# Patient Record
Sex: Male | Born: 1935 | Race: White | Hispanic: No | Smoking: Never smoker
Health system: Southern US, Community
[De-identification: ages and names within clinical notes are randomized; demographics above are authoritative.]

## PROBLEM LIST (undated history)

## (undated) DIAGNOSIS — B999 Unspecified infectious disease: Secondary | ICD-10-CM

## (undated) DIAGNOSIS — I10 Essential (primary) hypertension: Secondary | ICD-10-CM

## (undated) DIAGNOSIS — IMO0001 Reserved for inherently not codable concepts without codable children: Secondary | ICD-10-CM

## (undated) DIAGNOSIS — H409 Unspecified glaucoma: Secondary | ICD-10-CM

## (undated) DIAGNOSIS — D649 Anemia, unspecified: Secondary | ICD-10-CM

## (undated) DIAGNOSIS — K219 Gastro-esophageal reflux disease without esophagitis: Secondary | ICD-10-CM

## (undated) DIAGNOSIS — H9190 Unspecified hearing loss, unspecified ear: Secondary | ICD-10-CM

## (undated) DIAGNOSIS — M1990 Unspecified osteoarthritis, unspecified site: Secondary | ICD-10-CM

## (undated) DIAGNOSIS — H269 Unspecified cataract: Secondary | ICD-10-CM

## (undated) DIAGNOSIS — F419 Anxiety disorder, unspecified: Secondary | ICD-10-CM

## (undated) HISTORY — PX: TONSILLECTOMY: SUR1361

## (undated) HISTORY — PX: HERNIA REPAIR: SHX51

## (undated) HISTORY — PX: OTHER SURGICAL HISTORY: SHX169

## (undated) HISTORY — PX: EYE SURGERY: SHX253

---

## 2004-05-30 ENCOUNTER — Ambulatory Visit (HOSPITAL_COMMUNITY): Admission: RE | Admit: 2004-05-30 | Discharge: 2004-05-30 | Payer: Self-pay | Admitting: Gastroenterology

## 2010-10-14 ENCOUNTER — Encounter (HOSPITAL_COMMUNITY)
Admission: RE | Admit: 2010-10-14 | Discharge: 2010-10-14 | Disposition: A | Discharge status: Home / Self Care | Payer: Medicare Other | Source: Ambulatory Visit | Attending: Orthopedic Surgery | Admitting: Orthopedic Surgery

## 2010-10-14 ENCOUNTER — Ambulatory Visit (HOSPITAL_COMMUNITY)
Admission: RE | Admit: 2010-10-14 | Discharge: 2010-10-14 | Disposition: A | Discharge status: Home / Self Care | Payer: Medicare Other | Source: Ambulatory Visit | Attending: Orthopedic Surgery | Admitting: Orthopedic Surgery

## 2010-10-14 ENCOUNTER — Other Ambulatory Visit: Payer: Self-pay | Admitting: Orthopedic Surgery

## 2010-10-14 DIAGNOSIS — Z01811 Encounter for preprocedural respiratory examination: Secondary | ICD-10-CM | POA: Insufficient documentation

## 2010-10-14 DIAGNOSIS — Z01812 Encounter for preprocedural laboratory examination: Secondary | ICD-10-CM | POA: Insufficient documentation

## 2010-10-14 DIAGNOSIS — X58XXXA Exposure to other specified factors, initial encounter: Secondary | ICD-10-CM | POA: Insufficient documentation

## 2010-10-14 DIAGNOSIS — IMO0002 Reserved for concepts with insufficient information to code with codable children: Secondary | ICD-10-CM | POA: Insufficient documentation

## 2010-10-14 DIAGNOSIS — Z01818 Encounter for other preprocedural examination: Secondary | ICD-10-CM | POA: Insufficient documentation

## 2010-10-14 DIAGNOSIS — S83206A Unspecified tear of unspecified meniscus, current injury, right knee, initial encounter: Secondary | ICD-10-CM

## 2010-10-14 LAB — CBC
HCT: 42.5 % (ref 39.0–52.0)
Hemoglobin: 15.5 g/dL (ref 13.0–17.0)
MCH: 33.3 pg (ref 26.0–34.0)
MCHC: 36.5 g/dL — ABNORMAL HIGH (ref 30.0–36.0)
MCV: 91.2 fL (ref 78.0–100.0)
Platelets: 232 10*3/uL (ref 150–400)
RBC: 4.66 MIL/uL (ref 4.22–5.81)
RDW: 12.9 % (ref 11.5–15.5)
WBC: 8.6 10*3/uL (ref 4.0–10.5)

## 2010-10-14 LAB — SURGICAL PCR SCREEN: MRSA, PCR: NEGATIVE

## 2010-10-14 LAB — BASIC METABOLIC PANEL
CO2: 27 mEq/L (ref 19–32)
Calcium: 9.6 mg/dL (ref 8.4–10.5)
Chloride: 105 mEq/L (ref 96–112)
Glucose, Bld: 87 mg/dL (ref 70–99)
Sodium: 138 mEq/L (ref 135–145)

## 2010-10-15 ENCOUNTER — Inpatient Hospital Stay (HOSPITAL_COMMUNITY)
Admission: RE | Admit: 2010-10-15 | Discharge: 2010-10-15 | DRG: 489 | Disposition: A | Discharge status: Home Health | Payer: Medicare Other | Source: Ambulatory Visit | Attending: Orthopedic Surgery | Admitting: Orthopedic Surgery

## 2010-10-15 DIAGNOSIS — X58XXXA Exposure to other specified factors, initial encounter: Secondary | ICD-10-CM | POA: Diagnosis present

## 2010-10-15 DIAGNOSIS — IMO0002 Reserved for concepts with insufficient information to code with codable children: Principal | ICD-10-CM | POA: Diagnosis present

## 2010-10-15 DIAGNOSIS — I10 Essential (primary) hypertension: Secondary | ICD-10-CM | POA: Diagnosis present

## 2010-10-17 NOTE — Op Note (Signed)
  NAME:  Roger, Cameron               ACCOUNT NO.:  [id number]  MEDICAL RECORD NO.:  [id number]           PATIENT TYPE:  I  LOCATION:  2550                         FACILITY:  [facility]  PHYSICIAN:  Burnard Bunting, M.D.    DATE OF BIRTH:  02-Aug-1928  DATE OF PROCEDURE:  10/14/2009 DATE OF DISCHARGE:  10/15/2010                              OPERATIVE REPORT   PREOPERATIVE DIAGNOSIS:  Right knee medial meniscal tear.  POSTOPERATIVE DIAGNOSIS:  Right knee medial meniscal tear.  PROCEDURES:  Right knee diagnostic arthroscopy, partial meniscectomy.  SURGEON:  Burnard Bunting, MD  ASSISTANT:  None.  ANESTHESIA:  General endotracheal.  ESTIMATED BLOOD LOSS:  Minimal.  INDICATIONS:  [name] is a 77 year old patient who sustained a right knee injury, presents now for right knee arthroscopy, partial medial meniscectomy after failure to conservative management.  I explained to him the risk and benefits.  OPERATIVE FINDINGS: 1. Examination under anesthesia, range of motion 0-130 with severely     varus-valgus stress.  ACL and PCL intact.  No posterolateral     rotatory instability is noted. 2. Diagnostic operative arthroscopy. 3. Intact lateral compartment and articular cartilage of meniscus. 4. 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  No     loose bodies in the medial or lateral gutter.  ACL and PCL intact.     Medial meniscus had a tear involving 50-60% anteroposterior width     of the meniscus, posterior horn oblique and radial components.     There was some early grade 1 chondromalacia on the medial femoral     condyle and medial tibial plateau.  PROCEDURE IN DETAIL:  The patient was brought to the operating room where general endotracheal anesthesia was induced.  Preoperative IV antibiotics were administered.  Time-out was  called.  Right foot was prepped with DuraPrep solution and draped in sterile manner.  Anterior, inferior, lateral ports were established.  Anterior-inferior medial portal was established from direct visualization.  Diagnostic arthroscopy was performed.  The patient did have some grade 1 chondromalacia on the surface of the patella, small old chondral defect noted in the distal zone of the trochlea.  This is about the size of a dime.  Lateral compartment, articular cartilage and meniscus was intact. ACL, PCL intact.  Medial meniscal tear was identified, debrided back to stable rim using a combination of basket punch and shaver.  Thorough irrigation was then performed to the rest of the knee.  The instruments were removed.  Port was closed with 3-0 nylon.  Solution of Marcaine, morphine and clonidine was injected to the knee.  The patient tolerated the procedure without immediate complication.  Bulky wrap was applied.     Burnard Bunting, M.D.     GSD/MEDQ  D:  10/15/2010  T:  10/16/2010  Job:  [id number]  Electronically Signed by Reece Agar.  Abeer Deskins M.D. on 10/17/2010 08:58:59 AM

## 2011-07-16 ENCOUNTER — Other Ambulatory Visit: Payer: Self-pay | Admitting: Rehabilitation

## 2011-07-16 DIAGNOSIS — M419 Scoliosis, unspecified: Secondary | ICD-10-CM

## 2011-07-23 ENCOUNTER — Ambulatory Visit
Admission: RE | Admit: 2011-07-23 | Discharge: 2011-07-23 | Disposition: A | Discharge status: Home / Self Care | Payer: Medicare Other | Source: Ambulatory Visit | Attending: Rehabilitation | Admitting: Rehabilitation

## 2011-07-23 ENCOUNTER — Ambulatory Visit
Admission: RE | Admit: 2011-07-23 | Discharge: 2011-07-23 | Disposition: A | Discharge status: Home / Self Care | Payer: 59 | Source: Ambulatory Visit | Attending: Rehabilitation | Admitting: Rehabilitation

## 2011-07-23 DIAGNOSIS — M419 Scoliosis, unspecified: Secondary | ICD-10-CM

## 2011-07-23 DIAGNOSIS — Z1389 Encounter for screening for other disorder: Secondary | ICD-10-CM | POA: Diagnosis not present

## 2011-07-23 DIAGNOSIS — R209 Unspecified disturbances of skin sensation: Secondary | ICD-10-CM | POA: Diagnosis not present

## 2011-07-23 DIAGNOSIS — M5126 Other intervertebral disc displacement, lumbar region: Secondary | ICD-10-CM | POA: Diagnosis not present

## 2011-07-23 DIAGNOSIS — M47817 Spondylosis without myelopathy or radiculopathy, lumbosacral region: Secondary | ICD-10-CM | POA: Diagnosis not present

## 2011-07-28 DIAGNOSIS — R7301 Impaired fasting glucose: Secondary | ICD-10-CM | POA: Diagnosis not present

## 2011-07-28 DIAGNOSIS — E785 Hyperlipidemia, unspecified: Secondary | ICD-10-CM | POA: Diagnosis not present

## 2011-07-28 DIAGNOSIS — I10 Essential (primary) hypertension: Secondary | ICD-10-CM | POA: Diagnosis not present

## 2011-07-28 DIAGNOSIS — Z125 Encounter for screening for malignant neoplasm of prostate: Secondary | ICD-10-CM | POA: Diagnosis not present

## 2011-07-28 DIAGNOSIS — R82998 Other abnormal findings in urine: Secondary | ICD-10-CM | POA: Diagnosis not present

## 2011-07-28 DIAGNOSIS — M109 Gout, unspecified: Secondary | ICD-10-CM | POA: Diagnosis not present

## 2011-07-31 DIAGNOSIS — M545 Low back pain: Secondary | ICD-10-CM | POA: Diagnosis not present

## 2011-07-31 DIAGNOSIS — M48061 Spinal stenosis, lumbar region without neurogenic claudication: Secondary | ICD-10-CM | POA: Diagnosis not present

## 2011-08-04 DIAGNOSIS — Z1212 Encounter for screening for malignant neoplasm of rectum: Secondary | ICD-10-CM | POA: Diagnosis not present

## 2011-08-04 DIAGNOSIS — Z125 Encounter for screening for malignant neoplasm of prostate: Secondary | ICD-10-CM | POA: Diagnosis not present

## 2011-08-04 DIAGNOSIS — Z0000 Encounter for general adult medical examination without abnormal findings: Secondary | ICD-10-CM | POA: Diagnosis not present

## 2011-08-04 DIAGNOSIS — E785 Hyperlipidemia, unspecified: Secondary | ICD-10-CM | POA: Diagnosis not present

## 2011-08-04 DIAGNOSIS — F341 Dysthymic disorder: Secondary | ICD-10-CM | POA: Diagnosis not present

## 2011-08-04 DIAGNOSIS — I10 Essential (primary) hypertension: Secondary | ICD-10-CM | POA: Diagnosis not present

## 2011-08-05 DIAGNOSIS — M4120 Other idiopathic scoliosis, site unspecified: Secondary | ICD-10-CM | POA: Diagnosis not present

## 2011-08-05 DIAGNOSIS — M545 Low back pain: Secondary | ICD-10-CM | POA: Diagnosis not present

## 2011-08-05 DIAGNOSIS — M48061 Spinal stenosis, lumbar region without neurogenic claudication: Secondary | ICD-10-CM | POA: Diagnosis not present

## 2011-08-21 DIAGNOSIS — M545 Low back pain: Secondary | ICD-10-CM | POA: Diagnosis not present

## 2011-08-21 DIAGNOSIS — IMO0002 Reserved for concepts with insufficient information to code with codable children: Secondary | ICD-10-CM | POA: Diagnosis not present

## 2011-08-21 DIAGNOSIS — M48061 Spinal stenosis, lumbar region without neurogenic claudication: Secondary | ICD-10-CM | POA: Diagnosis not present

## 2011-08-26 DIAGNOSIS — M545 Low back pain: Secondary | ICD-10-CM | POA: Diagnosis not present

## 2011-08-26 DIAGNOSIS — M48061 Spinal stenosis, lumbar region without neurogenic claudication: Secondary | ICD-10-CM | POA: Diagnosis not present

## 2011-09-08 DIAGNOSIS — M545 Low back pain: Secondary | ICD-10-CM | POA: Diagnosis not present

## 2011-09-08 DIAGNOSIS — M47817 Spondylosis without myelopathy or radiculopathy, lumbosacral region: Secondary | ICD-10-CM | POA: Diagnosis not present

## 2011-09-08 DIAGNOSIS — IMO0002 Reserved for concepts with insufficient information to code with codable children: Secondary | ICD-10-CM | POA: Diagnosis not present

## 2011-09-09 DIAGNOSIS — IMO0002 Reserved for concepts with insufficient information to code with codable children: Secondary | ICD-10-CM | POA: Diagnosis not present

## 2011-09-09 DIAGNOSIS — M545 Low back pain: Secondary | ICD-10-CM | POA: Diagnosis not present

## 2011-09-25 DIAGNOSIS — M545 Low back pain: Secondary | ICD-10-CM | POA: Diagnosis not present

## 2011-09-25 DIAGNOSIS — M47817 Spondylosis without myelopathy or radiculopathy, lumbosacral region: Secondary | ICD-10-CM | POA: Diagnosis not present

## 2011-09-30 DIAGNOSIS — M47817 Spondylosis without myelopathy or radiculopathy, lumbosacral region: Secondary | ICD-10-CM | POA: Diagnosis not present

## 2011-10-20 DIAGNOSIS — M47817 Spondylosis without myelopathy or radiculopathy, lumbosacral region: Secondary | ICD-10-CM | POA: Diagnosis not present

## 2011-10-20 DIAGNOSIS — M545 Low back pain: Secondary | ICD-10-CM | POA: Diagnosis not present

## 2011-12-22 DIAGNOSIS — Z961 Presence of intraocular lens: Secondary | ICD-10-CM | POA: Diagnosis not present

## 2011-12-22 DIAGNOSIS — H409 Unspecified glaucoma: Secondary | ICD-10-CM | POA: Diagnosis not present

## 2011-12-22 DIAGNOSIS — H4011X0 Primary open-angle glaucoma, stage unspecified: Secondary | ICD-10-CM | POA: Diagnosis not present

## 2011-12-22 DIAGNOSIS — H25019 Cortical age-related cataract, unspecified eye: Secondary | ICD-10-CM | POA: Diagnosis not present

## 2011-12-29 DIAGNOSIS — M25569 Pain in unspecified knee: Secondary | ICD-10-CM | POA: Diagnosis not present

## 2012-01-13 DIAGNOSIS — L570 Actinic keratosis: Secondary | ICD-10-CM | POA: Diagnosis not present

## 2012-01-13 DIAGNOSIS — I872 Venous insufficiency (chronic) (peripheral): Secondary | ICD-10-CM | POA: Diagnosis not present

## 2012-01-13 DIAGNOSIS — L820 Inflamed seborrheic keratosis: Secondary | ICD-10-CM | POA: Diagnosis not present

## 2012-01-15 DIAGNOSIS — IMO0002 Reserved for concepts with insufficient information to code with codable children: Secondary | ICD-10-CM | POA: Diagnosis not present

## 2012-01-30 DIAGNOSIS — Y939 Activity, unspecified: Secondary | ICD-10-CM | POA: Diagnosis not present

## 2012-01-30 DIAGNOSIS — X58XXXA Exposure to other specified factors, initial encounter: Secondary | ICD-10-CM | POA: Diagnosis not present

## 2012-01-30 DIAGNOSIS — M23305 Other meniscus derangements, unspecified medial meniscus, unspecified knee: Secondary | ICD-10-CM | POA: Diagnosis not present

## 2012-01-30 DIAGNOSIS — IMO0002 Reserved for concepts with insufficient information to code with codable children: Secondary | ICD-10-CM | POA: Diagnosis not present

## 2012-01-30 DIAGNOSIS — Y929 Unspecified place or not applicable: Secondary | ICD-10-CM | POA: Diagnosis not present

## 2012-01-30 DIAGNOSIS — M2240 Chondromalacia patellae, unspecified knee: Secondary | ICD-10-CM | POA: Diagnosis not present

## 2012-01-30 DIAGNOSIS — Y998 Other external cause status: Secondary | ICD-10-CM | POA: Diagnosis not present

## 2012-01-30 DIAGNOSIS — M959 Acquired deformity of musculoskeletal system, unspecified: Secondary | ICD-10-CM | POA: Diagnosis not present

## 2012-01-30 DIAGNOSIS — M948X9 Other specified disorders of cartilage, unspecified sites: Secondary | ICD-10-CM | POA: Diagnosis not present

## 2012-02-05 DIAGNOSIS — M23305 Other meniscus derangements, unspecified medial meniscus, unspecified knee: Secondary | ICD-10-CM | POA: Diagnosis not present

## 2012-03-11 DIAGNOSIS — D0460 Carcinoma in situ of skin of unspecified upper limb, including shoulder: Secondary | ICD-10-CM | POA: Diagnosis not present

## 2012-04-12 DIAGNOSIS — E785 Hyperlipidemia, unspecified: Secondary | ICD-10-CM | POA: Diagnosis not present

## 2012-04-12 DIAGNOSIS — R7301 Impaired fasting glucose: Secondary | ICD-10-CM | POA: Diagnosis not present

## 2012-04-12 DIAGNOSIS — I10 Essential (primary) hypertension: Secondary | ICD-10-CM | POA: Diagnosis not present

## 2012-04-12 DIAGNOSIS — M545 Low back pain: Secondary | ICD-10-CM | POA: Diagnosis not present

## 2012-04-12 DIAGNOSIS — Z23 Encounter for immunization: Secondary | ICD-10-CM | POA: Diagnosis not present

## 2012-04-22 DIAGNOSIS — M23305 Other meniscus derangements, unspecified medial meniscus, unspecified knee: Secondary | ICD-10-CM | POA: Diagnosis not present

## 2012-04-28 DIAGNOSIS — D0460 Carcinoma in situ of skin of unspecified upper limb, including shoulder: Secondary | ICD-10-CM | POA: Diagnosis not present

## 2012-05-18 DIAGNOSIS — M1710 Unilateral primary osteoarthritis, unspecified knee: Secondary | ICD-10-CM | POA: Diagnosis not present

## 2012-06-16 DIAGNOSIS — M1710 Unilateral primary osteoarthritis, unspecified knee: Secondary | ICD-10-CM | POA: Diagnosis not present

## 2012-06-23 DIAGNOSIS — M1710 Unilateral primary osteoarthritis, unspecified knee: Secondary | ICD-10-CM | POA: Diagnosis not present

## 2012-06-28 DIAGNOSIS — H4011X0 Primary open-angle glaucoma, stage unspecified: Secondary | ICD-10-CM | POA: Diagnosis not present

## 2012-06-28 DIAGNOSIS — H409 Unspecified glaucoma: Secondary | ICD-10-CM | POA: Diagnosis not present

## 2012-06-30 DIAGNOSIS — M1710 Unilateral primary osteoarthritis, unspecified knee: Secondary | ICD-10-CM | POA: Diagnosis not present

## 2012-07-28 DIAGNOSIS — M109 Gout, unspecified: Secondary | ICD-10-CM | POA: Diagnosis not present

## 2012-07-28 DIAGNOSIS — E785 Hyperlipidemia, unspecified: Secondary | ICD-10-CM | POA: Diagnosis not present

## 2012-07-28 DIAGNOSIS — R82998 Other abnormal findings in urine: Secondary | ICD-10-CM | POA: Diagnosis not present

## 2012-07-28 DIAGNOSIS — Z125 Encounter for screening for malignant neoplasm of prostate: Secondary | ICD-10-CM | POA: Diagnosis not present

## 2012-07-28 DIAGNOSIS — R7301 Impaired fasting glucose: Secondary | ICD-10-CM | POA: Diagnosis not present

## 2012-07-28 DIAGNOSIS — I10 Essential (primary) hypertension: Secondary | ICD-10-CM | POA: Diagnosis not present

## 2012-08-04 DIAGNOSIS — M545 Low back pain: Secondary | ICD-10-CM | POA: Diagnosis not present

## 2012-08-04 DIAGNOSIS — Z0000 Encounter for general adult medical examination without abnormal findings: Secondary | ICD-10-CM | POA: Diagnosis not present

## 2012-08-04 DIAGNOSIS — K219 Gastro-esophageal reflux disease without esophagitis: Secondary | ICD-10-CM | POA: Diagnosis not present

## 2012-08-04 DIAGNOSIS — Z1331 Encounter for screening for depression: Secondary | ICD-10-CM | POA: Diagnosis not present

## 2012-08-04 DIAGNOSIS — Z125 Encounter for screening for malignant neoplasm of prostate: Secondary | ICD-10-CM | POA: Diagnosis not present

## 2012-08-04 DIAGNOSIS — Z1212 Encounter for screening for malignant neoplasm of rectum: Secondary | ICD-10-CM | POA: Diagnosis not present

## 2012-08-04 DIAGNOSIS — I10 Essential (primary) hypertension: Secondary | ICD-10-CM | POA: Diagnosis not present

## 2012-08-12 DIAGNOSIS — M1710 Unilateral primary osteoarthritis, unspecified knee: Secondary | ICD-10-CM | POA: Diagnosis not present

## 2012-10-20 DIAGNOSIS — L820 Inflamed seborrheic keratosis: Secondary | ICD-10-CM | POA: Diagnosis not present

## 2012-10-20 DIAGNOSIS — L819 Disorder of pigmentation, unspecified: Secondary | ICD-10-CM | POA: Diagnosis not present

## 2012-10-20 DIAGNOSIS — L570 Actinic keratosis: Secondary | ICD-10-CM | POA: Diagnosis not present

## 2012-11-11 DIAGNOSIS — M1710 Unilateral primary osteoarthritis, unspecified knee: Secondary | ICD-10-CM | POA: Diagnosis not present

## 2012-11-15 ENCOUNTER — Other Ambulatory Visit: Payer: Self-pay | Admitting: Orthopedic Surgery

## 2012-11-15 DIAGNOSIS — M1711 Unilateral primary osteoarthritis, right knee: Secondary | ICD-10-CM

## 2012-11-22 ENCOUNTER — Ambulatory Visit
Admission: RE | Admit: 2012-11-22 | Discharge: 2012-11-22 | Disposition: A | Discharge status: Home / Self Care | Payer: Medicare Other | Source: Ambulatory Visit | Attending: Orthopedic Surgery | Admitting: Orthopedic Surgery

## 2012-11-22 DIAGNOSIS — M259 Joint disorder, unspecified: Secondary | ICD-10-CM | POA: Diagnosis not present

## 2012-11-22 DIAGNOSIS — M1711 Unilateral primary osteoarthritis, right knee: Secondary | ICD-10-CM

## 2012-11-22 DIAGNOSIS — M1710 Unilateral primary osteoarthritis, unspecified knee: Secondary | ICD-10-CM | POA: Diagnosis not present

## 2012-12-14 DIAGNOSIS — H409 Unspecified glaucoma: Secondary | ICD-10-CM | POA: Diagnosis not present

## 2012-12-14 DIAGNOSIS — H4011X0 Primary open-angle glaucoma, stage unspecified: Secondary | ICD-10-CM | POA: Diagnosis not present

## 2012-12-21 ENCOUNTER — Encounter (HOSPITAL_COMMUNITY): Payer: Self-pay | Admitting: Pharmacy Technician

## 2012-12-21 ENCOUNTER — Other Ambulatory Visit: Payer: Self-pay | Admitting: Orthopedic Surgery

## 2012-12-21 MED ORDER — BUPIVACAINE LIPOSOME 1.3 % IJ SUSP: 20.0000 mL | Freq: Once | INTRAMUSCULAR | Status: DC

## 2012-12-21 MED ORDER — DEXAMETHASONE SODIUM PHOSPHATE 10 MG/ML IJ SOLN: 10.0000 mg | Freq: Once | INTRAMUSCULAR | Status: DC

## 2012-12-21 NOTE — Progress Notes (Signed)
Need orders in EPIC.  Preop appoint 12/29/12 at 1100.  Surgery 01/03/13.  Thank You.

## 2012-12-21 NOTE — Progress Notes (Signed)
Preoperative surgical orders have been place into the Epic hospital system for Lum Keas on 12/21/2012, 4:19 PM  by Patrica Duel for surgery on 01/03/2013.  Preop Uni Knee orders including Experal, PO Tylenol, and IV Decadron as long as there are no contraindications to the above medications. Avel Peace, PA-C

## 2012-12-29 ENCOUNTER — Encounter (HOSPITAL_COMMUNITY)
Admission: RE | Admit: 2012-12-29 | Discharge: 2012-12-29 | Disposition: A | Discharge status: Home / Self Care | Payer: Medicare Other | Source: Ambulatory Visit | Attending: Orthopedic Surgery | Admitting: Orthopedic Surgery

## 2012-12-29 ENCOUNTER — Encounter (HOSPITAL_COMMUNITY): Payer: Self-pay

## 2012-12-29 ENCOUNTER — Ambulatory Visit (HOSPITAL_COMMUNITY)
Admission: RE | Admit: 2012-12-29 | Discharge: 2012-12-29 | Disposition: A | Discharge status: Home / Self Care | Payer: Medicare Other | Source: Ambulatory Visit | Attending: Orthopedic Surgery | Admitting: Orthopedic Surgery

## 2012-12-29 DIAGNOSIS — Z01810 Encounter for preprocedural cardiovascular examination: Secondary | ICD-10-CM | POA: Diagnosis not present

## 2012-12-29 DIAGNOSIS — Z01818 Encounter for other preprocedural examination: Secondary | ICD-10-CM | POA: Diagnosis not present

## 2012-12-29 DIAGNOSIS — J8410 Pulmonary fibrosis, unspecified: Secondary | ICD-10-CM | POA: Insufficient documentation

## 2012-12-29 DIAGNOSIS — Z0183 Encounter for blood typing: Secondary | ICD-10-CM | POA: Insufficient documentation

## 2012-12-29 DIAGNOSIS — I10 Essential (primary) hypertension: Secondary | ICD-10-CM | POA: Insufficient documentation

## 2012-12-29 DIAGNOSIS — I771 Stricture of artery: Secondary | ICD-10-CM | POA: Insufficient documentation

## 2012-12-29 DIAGNOSIS — Z01812 Encounter for preprocedural laboratory examination: Secondary | ICD-10-CM | POA: Diagnosis not present

## 2012-12-29 DIAGNOSIS — R9431 Abnormal electrocardiogram [ECG] [EKG]: Secondary | ICD-10-CM | POA: Insufficient documentation

## 2012-12-29 HISTORY — DX: Essential (primary) hypertension: I10

## 2012-12-29 HISTORY — DX: Gastro-esophageal reflux disease without esophagitis: K21.9

## 2012-12-29 HISTORY — DX: Unspecified glaucoma: H40.9

## 2012-12-29 HISTORY — DX: Unspecified osteoarthritis, unspecified site: M19.90

## 2012-12-29 HISTORY — DX: Reserved for inherently not codable concepts without codable children: IMO0001

## 2012-12-29 HISTORY — DX: Unspecified hearing loss, unspecified ear: H91.90

## 2012-12-29 HISTORY — DX: Anxiety disorder, unspecified: F41.9

## 2012-12-29 HISTORY — DX: Unspecified cataract: H26.9

## 2012-12-29 LAB — COMPREHENSIVE METABOLIC PANEL
Albumin: 4.2 g/dL (ref 3.5–5.2)
BUN: 9 mg/dL (ref 6–23)
CO2: 27 mEq/L (ref 19–32)
Calcium: 10 mg/dL (ref 8.4–10.5)
Chloride: 99 mEq/L (ref 96–112)
Creatinine, Ser: 0.83 mg/dL (ref 0.50–1.35)
Potassium: 4.5 mEq/L (ref 3.5–5.1)
Sodium: 136 mEq/L (ref 135–145)
Total Protein: 7.8 g/dL (ref 6.0–8.3)

## 2012-12-29 LAB — CBC
Hemoglobin: 16 g/dL (ref 13.0–17.0)
MCH: 32.4 pg (ref 26.0–34.0)
RBC: 4.94 MIL/uL (ref 4.22–5.81)
WBC: 8.6 10*3/uL (ref 4.0–10.5)

## 2012-12-29 LAB — PROTIME-INR
INR: 1.13 (ref 0.00–1.49)
Prothrombin Time: 14.3 seconds (ref 11.6–15.2)

## 2012-12-29 LAB — URINALYSIS, ROUTINE W REFLEX MICROSCOPIC
Bilirubin Urine: NEGATIVE
Ketones, ur: NEGATIVE mg/dL
Nitrite: NEGATIVE
Protein, ur: NEGATIVE mg/dL
Urobilinogen, UA: 0.2 mg/dL (ref 0.0–1.0)
pH: 6.5 (ref 5.0–8.0)

## 2012-12-29 LAB — APTT: aPTT: 34 seconds (ref 24–37)

## 2012-12-29 LAB — URINE MICROSCOPIC-ADD ON

## 2012-12-29 LAB — SURGICAL PCR SCREEN: Staphylococcus aureus: NEGATIVE

## 2012-12-29 NOTE — Patient Instructions (Addendum)
YOUR SURGERY IS SCHEDULED AT [facility]  ON:  Monday  June 16  REPORT TO [facility] AT:  9:40 AM      PHONE # FOR SHORT STAY IS [phone]  DO NOT EAT OR DRINK ANYTHING AFTER MIDNIGHT THE NIGHT BEFORE YOUR SURGERY.  YOU MAY BRUSH YOUR TEETH, RINSE OUT YOUR MOUTH--BUT NO WATER, NO FOOD, NO CHEWING GUM, NO MINTS, NO CANDIES, NO CHEWING TOBACCO.  PLEASE TAKE THE FOLLOWING MEDICATIONS THE AM OF YOUR SURGERY WITH A FEW SIPS OF WATER:  ATENOLOL, PREVACID,  ALPRAZOLAM,  AND BRING YOUR EYE DROPS TO THE HOSPITAL.   DO NOT BRING VALUABLES, MONEY, CREDIT CARDS.  DO NOT WEAR JEWELRY, MAKE-UP, NAIL POLISH AND NO METAL PINS OR CLIPS IN YOUR HAIR. CONTACT LENS, DENTURES / PARTIALS, GLASSES SHOULD NOT BE WORN TO SURGERY AND IN MOST CASES-HEARING AIDS WILL NEED TO BE REMOVED.  BRING YOUR GLASSES CASE, ANY EQUIPMENT NEEDED FOR YOUR CONTACT LENS. FOR PATIENTS ADMITTED TO THE HOSPITAL--CHECK OUT TIME THE DAY OF DISCHARGE IS 11:00 AM.  ALL INPATIENT ROOMS ARE PRIVATE - WITH BATHROOM, TELEPHONE, TELEVISION AND WIFI INTERNET.                                 PLEASE READ OVER ANY  FACT SHEETS THAT YOU WERE GIVEN: MRSA INFORMATION, BLOOD TRANSFUSION INFORMATION, INCENTIVE SPIROMETER INFORMATION. FAILURE TO FOLLOW THESE INSTRUCTIONS MAY RESULT IN THE CANCELLATION OF YOUR SURGERY.   PATIENT SIGNATURE_________________________________  Pt notified his surgery time changed - surgery is at 11:35 am and he should arrive to short stay by 8:35 am on Monday 6/16.  Pt voiced understanding.

## 2012-12-29 NOTE — Pre-Procedure Instructions (Signed)
EKG AND CXR WERE DONE TODAY PREOP AT [facility] AS PER ANESTHESIOLOGIST'S GUIDELINES. 

## 2013-01-02 ENCOUNTER — Other Ambulatory Visit: Payer: Self-pay | Admitting: Orthopedic Surgery

## 2013-01-02 NOTE — H&P (Signed)
Roger Cameron  DOB: 09/28/1928 Undefined / Language: English / Race: White Male  Date of Admission:  01/03/2013  Chief Complaint:  Right Knee Pain  History of Present Illness The patient is a 79 year old male who comes in for a preoperative History and Physical. The patient is scheduled for a right medial compartment unicompartmental arthroplasty at [facility] on 01/03/2013. The patient is a 79 year old male who presents for follow up of their knee. The patient is being followed for their right knee pain and osteoarthritis. They are now out from Synvisc. Symptoms reported today include: pain (with weightbearing, more severe in morning when first getting up). The following medication has been used for pain control: none. The patient indicates that they have questions or concerns today regarding options. Roger Cameron said he is getting progressively worse. When we had scoped his knee last year he had a horrible retear of his meniscus and had fairly significant degenerative change in that medial compartment. Eventually he had good improvement in his pain but then was having worsening discomfort in the fall necessitating a series of Synvisc injections. Unfortunately Synvisc did not provide a tremendous amount of relief. He is getting harder for him to do what he desires. He has pain with almost all activities. He is starting to get some pain at night. The knee is not giving out on him. He is an avid exercises and has not been able to do so because of the knee pain. He is ready to get the knee fixed. They have been treated conservatively in the past for the above stated problem and despite conservative measures, they continue to have progressive pain and severe functional limitations and dysfunction. They have failed non-operative management including home exercise, medications, and injections. It is felt that they would benefit from undergoing total joint replacement. Risks and  benefits of the procedure have been discussed with the patient and they elect to proceed with surgery. There are no active contraindications to surgery such as ongoing infection or rapidly progressive neurological disease.  Problem List Primary osteoarthritis of one knee (715.16)   Allergies No Known Drug Allergies. 12/29/2011   Family History Cancer. mother Heart Disease. sister Other medical problems. TB   Social History Marital status. widowed Number of flights of stairs before winded. 4-5 Pain Contract. no Exercise. Exercises daily; does gym / weights Illicit drug use. no Living situation. live alone Previously in rehab. no Tobacco / smoke exposure. no Tobacco use. never smoker Drug/Alcohol Rehab (Currently). no Alcohol use. current drinker; drinks beer; only occasionally per week Children. 1 Current work status. retired Post-Surgical Plans. Plan is to go home with daughter who will be staying with him for several weeks. Advance Directives. Living Will   Medication History Atenolol (50MG Tablet, Oral) Active. Diovan (320MG Tablet, Oral) Active. Prevacid (30MG Capsule DR, Oral) Active. BC Fast Pain Relief (650-195-33.3MG Packet, Oral) Active. Hydrochlorothiazide (25MG Tablet, Oral) Active. Latanoprost (0.005% Solution, Ophthalmic) Active. ALPRAZolam (0.5MG Tablet, Oral) Active.   Past Surgical History Arthroscopy of Knee. right Cataract Surgery. right Inguinal Hernia Repair. laparoscopic: right   Medical History Impaired Hearing Anxiety Disorder Glaucoma Cataract Gastroesophageal Reflux Disease High blood pressure   Review of Systems General:Not Present- Chills, Fever, Night Sweats, Fatigue, Weight Gain, Weight Loss and Memory Loss. Skin:Not Present- Hives, Itching, Rash, Eczema and Lesions. HEENT:Not Present- Tinnitus, Headache, Double Vision, Visual Loss, Hearing Loss and Dentures. Respiratory:Not Present- Shortness  of breath with exertion, Shortness of breath at rest, Allergies,   Coughing up blood and Chronic Cough. Cardiovascular:Not Present- Chest Pain, Racing/skipping heartbeats, Difficulty Breathing Lying Down, Murmur, Swelling and Palpitations. Gastrointestinal:Present- Heartburn. Not Present- Bloody Stool, Abdominal Pain, Vomiting, Nausea, Constipation, Diarrhea, Difficulty Swallowing, Jaundice and Loss of appetitie. Male Genitourinary:Present- Urinary frequency and Urinating at Night. Not Present- Blood in Urine, Weak urinary stream, Discharge, Flank Pain, Incontinence, Painful Urination, Urgency and Urinary Retention. Musculoskeletal:Present- Joint Pain. Not Present- Muscle Weakness, Muscle Pain, Joint Swelling, Back Pain, Morning Stiffness and Spasms. Neurological:Not Present- Tremor, Dizziness, Blackout spells, Paralysis, Difficulty with balance and Weakness. Psychiatric:Not Present- Insomnia.   Vitals Weight: 210 lb Height: 73 in Weight was reported by patient. Height was reported by patient. Body Surface Area: 2.21 m Body Mass Index: 27.71 kg/m Pulse: 64 (Regular) Resp.: 16 (Unlabored) BP: 142/88 (Sitting, Left Arm, Standard)    Physical Exam The physical exam findings are as follows:  Note: Patient is an 79 year old male with continued knee pain.   General Mental Status - Alert, cooperative and good historian. General Appearance- pleasant. Not in acute distress. Orientation- Oriented X3. Build & Nutrition- Well nourished and Well developed.   Head and Neck Head- normocephalic, atraumatic . Neck Global Assessment- supple. no bruit auscultated on the right and no bruit auscultated on the left.   Eye Pupil- Bilateral- Regular and Round. Motion- Bilateral- EOMI.   ENMT  upper and lower partial dentures  Chest and Lung Exam Auscultation: Breath sounds:- clear at anterior chest wall and - clear at posterior chest wall. Adventitious  sounds:- No Adventitious sounds.   Cardiovascular Auscultation:Rhythm- Regular rate and rhythm. Heart Sounds- S1 WNL and S2 WNL. Murmurs & Other Heart Sounds: Murmur 1:Location- Aortic Area. Timing- Mid-systolic. Grade- II/VI.   Abdomen Palpation/Percussion:Tenderness- Abdomen is non-tender to palpation. Rigidity (guarding)- Abdomen is soft. Auscultation:Auscultation of the abdomen reveals - Bowel sounds normal.   Male Genitourinary Not done, not pertinent to present illness  Musculoskeletal On exam well developed male alert and oriented in no apparent distress. Evaluation of his hips normal range of motion with no discomfort. The left knee range is about 0 to 135. No swelling tenderness or instability. Right knee range 10 to 125. Moderate crepitus on range of motion. Significant medial tenderness. No lateral tenderness or instability.  RADIOGRAPHS: Radiographs performed today, AP both knees and lateral of the right show the left knee is normal. The right knee has significant medial joint space narrowing, not fully bone on bone but close. On the lateral view the patellofemoral joint is also very narrow.  Assessment & Plan Primary osteoarthritis of one knee (715.16) Impression: Right Knee Medial Compartment  Note: Plan is for a Right Knee Medial Compartment Unicompartmental Replacement by Dr. Aluisio.  Plan is to go home with daughter who sill stay with him.  PCP - Dr. Tisovec  Signed electronically by Alexzandrew L Perkins, III PA-C  

## 2013-01-03 ENCOUNTER — Encounter (HOSPITAL_COMMUNITY)
Admission: RE | Disposition: A | Discharge status: Home / Self Care | Payer: Self-pay | Source: Ambulatory Visit | Attending: Orthopedic Surgery

## 2013-01-03 ENCOUNTER — Observation Stay (HOSPITAL_COMMUNITY)
Admission: RE | Admit: 2013-01-03 | Discharge: 2013-01-04 | Disposition: A | Discharge status: Home / Self Care | Payer: Medicare Other | Source: Ambulatory Visit | Attending: Orthopedic Surgery | Admitting: Orthopedic Surgery

## 2013-01-03 ENCOUNTER — Encounter (HOSPITAL_COMMUNITY): Payer: Self-pay | Admitting: *Deleted

## 2013-01-03 ENCOUNTER — Encounter (HOSPITAL_COMMUNITY): Payer: Self-pay | Admitting: Anesthesiology

## 2013-01-03 ENCOUNTER — Inpatient Hospital Stay (HOSPITAL_COMMUNITY): Payer: Medicare Other | Admitting: Anesthesiology

## 2013-01-03 DIAGNOSIS — Z79899 Other long term (current) drug therapy: Secondary | ICD-10-CM | POA: Diagnosis not present

## 2013-01-03 DIAGNOSIS — K219 Gastro-esophageal reflux disease without esophagitis: Secondary | ICD-10-CM | POA: Insufficient documentation

## 2013-01-03 DIAGNOSIS — Z96651 Presence of right artificial knee joint: Secondary | ICD-10-CM

## 2013-01-03 DIAGNOSIS — M1710 Unilateral primary osteoarthritis, unspecified knee: Principal | ICD-10-CM | POA: Insufficient documentation

## 2013-01-03 DIAGNOSIS — I10 Essential (primary) hypertension: Secondary | ICD-10-CM | POA: Diagnosis not present

## 2013-01-03 HISTORY — PX: PARTIAL KNEE ARTHROPLASTY: SHX2174

## 2013-01-03 LAB — TYPE AND SCREEN
ABO/RH(D): O POS
Antibody Screen: NEGATIVE

## 2013-01-03 SURGERY — ARTHROPLASTY, KNEE, UNICOMPARTMENTAL
Anesthesia: Spinal | Site: Knee | Laterality: Right | Wound class: Clean

## 2013-01-03 MED ORDER — ACETAMINOPHEN 650 MG RE SUPP
650.0000 mg | Freq: Four times a day (QID) | RECTAL | Status: AC
  Filled 2013-01-03: qty 1

## 2013-01-03 MED ORDER — MEPERIDINE HCL 50 MG/ML IJ SOLN: 6.2500 mg | INTRAMUSCULAR | Status: DC | PRN

## 2013-01-03 MED ORDER — RIVAROXABAN 10 MG PO TABS
10.0000 mg | ORAL_TABLET | Freq: Every day | ORAL | Status: DC
  Administered 2013-01-04: 10 mg via ORAL
  Filled 2013-01-03 (×2): qty 1

## 2013-01-03 MED ORDER — DOCUSATE SODIUM 100 MG PO CAPS
100.0000 mg | ORAL_CAPSULE | Freq: Two times a day (BID) | ORAL | Status: DC
  Administered 2013-01-03 – 2013-01-04 (×2): 100 mg via ORAL

## 2013-01-03 MED ORDER — PANTOPRAZOLE SODIUM 40 MG PO TBEC
40.0000 mg | DELAYED_RELEASE_TABLET | Freq: Every day | ORAL | Status: DC
  Administered 2013-01-04: 40 mg via ORAL
  Filled 2013-01-03: qty 1

## 2013-01-03 MED ORDER — PROPOFOL INFUSION 10 MG/ML OPTIME
INTRAVENOUS | Status: DC | PRN
  Administered 2013-01-03: 75 ug/kg/min via INTRAVENOUS

## 2013-01-03 MED ORDER — HYDROCHLOROTHIAZIDE 25 MG PO TABS
25.0000 mg | ORAL_TABLET | Freq: Every morning | ORAL | Status: DC
  Administered 2013-01-04: 25 mg via ORAL
  Filled 2013-01-03 (×2): qty 1

## 2013-01-03 MED ORDER — BUPIVACAINE HCL (PF) 0.25 % IJ SOLN
INTRAMUSCULAR | Status: DC | PRN
  Administered 2013-01-03: 20 mL

## 2013-01-03 MED ORDER — CEFAZOLIN SODIUM 1-5 GM-% IV SOLN
1.0000 g | Freq: Four times a day (QID) | INTRAVENOUS | Status: AC
  Administered 2013-01-03 – 2013-01-04 (×2): 1 g via INTRAVENOUS
  Filled 2013-01-03 (×2): qty 50

## 2013-01-03 MED ORDER — ONDANSETRON HCL 4 MG PO TABS: 4.0000 mg | ORAL_TABLET | Freq: Four times a day (QID) | ORAL | Status: DC | PRN

## 2013-01-03 MED ORDER — FENTANYL CITRATE 0.05 MG/ML IJ SOLN
INTRAMUSCULAR | Status: DC | PRN
  Administered 2013-01-03: 50 ug via INTRAVENOUS

## 2013-01-03 MED ORDER — METOCLOPRAMIDE HCL 10 MG PO TABS: 5.0000 mg | ORAL_TABLET | Freq: Three times a day (TID) | ORAL | Status: DC | PRN

## 2013-01-03 MED ORDER — TRAMADOL HCL 50 MG PO TABS: 50.0000 mg | ORAL_TABLET | Freq: Four times a day (QID) | ORAL | Status: DC | PRN

## 2013-01-03 MED ORDER — OXYCODONE HCL 5 MG PO TABS
5.0000 mg | ORAL_TABLET | ORAL | Status: DC | PRN
  Administered 2013-01-03: 5 mg via ORAL
  Administered 2013-01-04: 10 mg via ORAL
  Filled 2013-01-03: qty 2
  Filled 2013-01-03: qty 1

## 2013-01-03 MED ORDER — ONDANSETRON HCL 4 MG/2ML IJ SOLN: 4.0000 mg | Freq: Four times a day (QID) | INTRAMUSCULAR | Status: DC | PRN

## 2013-01-03 MED ORDER — CEFAZOLIN SODIUM-DEXTROSE 2-3 GM-% IV SOLR
2.0000 g | INTRAVENOUS | Status: AC
  Administered 2013-01-03: 2 g via INTRAVENOUS

## 2013-01-03 MED ORDER — MIDAZOLAM HCL 5 MG/5ML IJ SOLN
INTRAMUSCULAR | Status: DC | PRN
  Administered 2013-01-03: 1 mg via INTRAVENOUS

## 2013-01-03 MED ORDER — PROMETHAZINE HCL 25 MG/ML IJ SOLN: 6.2500 mg | INTRAMUSCULAR | Status: DC | PRN

## 2013-01-03 MED ORDER — ACETAMINOPHEN 325 MG PO TABS
ORAL_TABLET | ORAL | Status: AC
  Administered 2013-01-03: 975 mg
  Filled 2013-01-03: qty 3

## 2013-01-03 MED ORDER — SODIUM CHLORIDE 0.9 % IJ SOLN
INTRAMUSCULAR | Status: DC | PRN
  Administered 2013-01-03: 30 mL via INTRAVENOUS

## 2013-01-03 MED ORDER — LATANOPROST 0.005 % OP SOLN
1.0000 [drp] | Freq: Every day | OPHTHALMIC | Status: DC
  Administered 2013-01-03: 1 [drp] via OPHTHALMIC
  Filled 2013-01-03: qty 2.5

## 2013-01-03 MED ORDER — OXYCODONE HCL 5 MG/5ML PO SOLN: 5.0000 mg | Freq: Once | ORAL | Status: DC | PRN

## 2013-01-03 MED ORDER — 0.9 % SODIUM CHLORIDE (POUR BTL) OPTIME
TOPICAL | Status: DC | PRN
  Administered 2013-01-03: 1000 mL

## 2013-01-03 MED ORDER — DEXAMETHASONE 6 MG PO TABS
10.0000 mg | ORAL_TABLET | Freq: Three times a day (TID) | ORAL | Status: AC
  Administered 2013-01-03 – 2013-01-04 (×2): 10 mg via ORAL
  Filled 2013-01-03 (×3): qty 1

## 2013-01-03 MED ORDER — BISACODYL 10 MG RE SUPP: 10.0000 mg | Freq: Every day | RECTAL | Status: DC | PRN

## 2013-01-03 MED ORDER — SODIUM CHLORIDE 0.9 % IV SOLN
INTRAVENOUS | Status: DC
  Administered 2013-01-03: 17:00:00 via INTRAVENOUS

## 2013-01-03 MED ORDER — DEXAMETHASONE SODIUM PHOSPHATE 10 MG/ML IJ SOLN
10.0000 mg | Freq: Three times a day (TID) | INTRAMUSCULAR | Status: AC
  Administered 2013-01-03: 10 mg via INTRAVENOUS
  Filled 2013-01-03 (×3): qty 1

## 2013-01-03 MED ORDER — DIPHENHYDRAMINE HCL 12.5 MG/5ML PO ELIX: 12.5000 mg | ORAL_SOLUTION | ORAL | Status: DC | PRN

## 2013-01-03 MED ORDER — EPHEDRINE SULFATE 50 MG/ML IJ SOLN
INTRAMUSCULAR | Status: DC | PRN
  Administered 2013-01-03 (×2): 5 mg via INTRAVENOUS

## 2013-01-03 MED ORDER — MORPHINE SULFATE 2 MG/ML IJ SOLN
1.0000 mg | INTRAMUSCULAR | Status: DC | PRN
  Administered 2013-01-03: 1 mg via INTRAVENOUS
  Filled 2013-01-03: qty 1

## 2013-01-03 MED ORDER — METOCLOPRAMIDE HCL 5 MG/ML IJ SOLN: 5.0000 mg | Freq: Three times a day (TID) | INTRAMUSCULAR | Status: DC | PRN

## 2013-01-03 MED ORDER — PHENOL 1.4 % MT LIQD
1.0000 | OROMUCOSAL | Status: DC | PRN
  Filled 2013-01-03: qty 177

## 2013-01-03 MED ORDER — BUPIVACAINE LIPOSOME 1.3 % IJ SUSP
INTRAMUSCULAR | Status: DC | PRN
  Administered 2013-01-03: 20 mL

## 2013-01-03 MED ORDER — METHOCARBAMOL 100 MG/ML IJ SOLN
500.0000 mg | Freq: Four times a day (QID) | INTRAVENOUS | Status: DC | PRN
  Administered 2013-01-03: 500 mg via INTRAVENOUS
  Filled 2013-01-03: qty 5

## 2013-01-03 MED ORDER — FLEET ENEMA 7-19 GM/118ML RE ENEM: 1.0000 | ENEMA | Freq: Once | RECTAL | Status: AC | PRN

## 2013-01-03 MED ORDER — ATENOLOL 25 MG PO TABS
25.0000 mg | ORAL_TABLET | Freq: Every morning | ORAL | Status: DC
  Administered 2013-01-04: 25 mg via ORAL
  Filled 2013-01-03 (×2): qty 1

## 2013-01-03 MED ORDER — ONDANSETRON HCL 4 MG/2ML IJ SOLN
INTRAMUSCULAR | Status: DC | PRN
  Administered 2013-01-03: 4 mg via INTRAVENOUS

## 2013-01-03 MED ORDER — ALPRAZOLAM 0.5 MG PO TABS: 0.5000 mg | ORAL_TABLET | Freq: Three times a day (TID) | ORAL | Status: DC | PRN

## 2013-01-03 MED ORDER — METHOCARBAMOL 500 MG PO TABS
500.0000 mg | ORAL_TABLET | Freq: Four times a day (QID) | ORAL | Status: DC | PRN
  Administered 2013-01-03: 500 mg via ORAL
  Filled 2013-01-03: qty 1

## 2013-01-03 MED ORDER — HYDROMORPHONE HCL PF 1 MG/ML IJ SOLN
0.2500 mg | INTRAMUSCULAR | Status: DC | PRN
  Administered 2013-01-03 (×2): 0.5 mg via INTRAVENOUS

## 2013-01-03 MED ORDER — LIDOCAINE HCL (CARDIAC) 20 MG/ML IV SOLN
INTRAVENOUS | Status: DC | PRN
  Administered 2013-01-03: 100 mg via INTRAVENOUS

## 2013-01-03 MED ORDER — ACETAMINOPHEN 500 MG PO TABS
1000.0000 mg | ORAL_TABLET | Freq: Four times a day (QID) | ORAL | Status: AC
  Administered 2013-01-03 – 2013-01-04 (×4): 1000 mg via ORAL
  Filled 2013-01-03 (×4): qty 2

## 2013-01-03 MED ORDER — KETOROLAC TROMETHAMINE 15 MG/ML IJ SOLN: 7.5000 mg | Freq: Four times a day (QID) | INTRAMUSCULAR | Status: AC | PRN

## 2013-01-03 MED ORDER — LACTATED RINGERS IV SOLN
INTRAVENOUS | Status: DC
  Administered 2013-01-03: 1000 mL via INTRAVENOUS

## 2013-01-03 MED ORDER — POLYETHYLENE GLYCOL 3350 17 G PO PACK: 17.0000 g | PACK | Freq: Every day | ORAL | Status: DC | PRN

## 2013-01-03 MED ORDER — BUPIVACAINE LIPOSOME 1.3 % IJ SUSP
20.0000 mL | Freq: Once | INTRAMUSCULAR | Status: DC
  Filled 2013-01-03: qty 20

## 2013-01-03 MED ORDER — MENTHOL 3 MG MT LOZG
1.0000 | LOZENGE | OROMUCOSAL | Status: DC | PRN
  Filled 2013-01-03: qty 9

## 2013-01-03 MED ORDER — OXYCODONE HCL 5 MG PO TABS: 5.0000 mg | ORAL_TABLET | Freq: Once | ORAL | Status: DC | PRN

## 2013-01-03 MED ORDER — BUPIVACAINE IN DEXTROSE 0.75-8.25 % IT SOLN
INTRATHECAL | Status: DC | PRN
  Administered 2013-01-03: 2 mL via INTRATHECAL

## 2013-01-03 MED ORDER — IRBESARTAN 300 MG PO TABS
300.0000 mg | ORAL_TABLET | Freq: Every day | ORAL | Status: DC
  Administered 2013-01-04: 300 mg via ORAL
  Filled 2013-01-03 (×2): qty 1

## 2013-01-03 SURGICAL SUPPLY — 49 items
APPLICATOR COTTON TIP 6IN STRL (MISCELLANEOUS) ×1 IMPLANT
BAG SPEC THK2 15X12 ZIP CLS (MISCELLANEOUS) ×1
BAG ZIPLOCK 12X15 (MISCELLANEOUS) ×2 IMPLANT
BANDAGE ELASTIC 4 VELCRO ST LF (GAUZE/BANDAGES/DRESSINGS) ×1 IMPLANT
BANDAGE ELASTIC 6 VELCRO ST LF (GAUZE/BANDAGES/DRESSINGS) ×1 IMPLANT
BANDAGE ESMARK 6X9 LF (GAUZE/BANDAGES/DRESSINGS) ×1 IMPLANT
BANDAGE GAUZE ELAST BULKY 4 IN (GAUZE/BANDAGES/DRESSINGS) ×1 IMPLANT
BNDG CMPR 9X6 STRL LF SNTH (GAUZE/BANDAGES/DRESSINGS) ×1
BNDG ESMARK 6X9 LF (GAUZE/BANDAGES/DRESSINGS) ×2
BOWL SMART MIX CTS (DISPOSABLE) ×2 IMPLANT
BUR OVAL 4.0 (BURR) ×1 IMPLANT
CAP KNEE UNI RIGHT MEDIAL ×1 IMPLANT
CEMENT HV SMART SET (Cement) ×2 IMPLANT
CLOTH BEACON ORANGE TIMEOUT ST (SAFETY) ×2 IMPLANT
CUFF TOURN SGL QUICK 34 (TOURNIQUET CUFF) ×2
CUFF TRNQT CYL 34X4X40X1 (TOURNIQUET CUFF) ×1 IMPLANT
DEPRESSOR TONGUE BLADE STERILE (MISCELLANEOUS) ×1 IMPLANT
DRAPE EXTREMITY T 121X128X90 (DRAPE) ×2 IMPLANT
DRAPE POUCH INSTRU U-SHP 10X18 (DRAPES) ×2 IMPLANT
DRSG ADAPTIC 3X8 NADH LF (GAUZE/BANDAGES/DRESSINGS) ×1 IMPLANT
DRSG EMULSION OIL 3X16 NADH (GAUZE/BANDAGES/DRESSINGS) ×2 IMPLANT
DRSG PAD ABDOMINAL 8X10 ST (GAUZE/BANDAGES/DRESSINGS) ×3 IMPLANT
ELECT REM PT RETURN 9FT ADLT (ELECTROSURGICAL) ×2
ELECTRODE REM PT RTRN 9FT ADLT (ELECTROSURGICAL) ×1 IMPLANT
EVACUATOR 1/8 PVC DRAIN (DRAIN) ×2 IMPLANT
FACESHIELD LNG OPTICON STERILE (SAFETY) ×9 IMPLANT
GOWN STRL NON-REIN LRG LVL3 (GOWN DISPOSABLE) ×2 IMPLANT
GOWN STRL REIN XL XLG (GOWN DISPOSABLE) ×2 IMPLANT
HANDPIECE INTERPULSE COAX TIP (DISPOSABLE) ×2
IMMOBILIZER KNEE 20 (SOFTGOODS) ×2
IMMOBILIZER KNEE 20 THIGH 36 (SOFTGOODS) IMPLANT
KIT BASIN OR (CUSTOM PROCEDURE TRAY) ×2 IMPLANT
MANIFOLD NEPTUNE II (INSTRUMENTS) ×2 IMPLANT
NDL SAFETY ECLIPSE 18X1.5 (NEEDLE) IMPLANT
NEEDLE HYPO 18GX1.5 SHARP (NEEDLE) ×4
PACK GENERAL/GYN (CUSTOM PROCEDURE TRAY) ×2 IMPLANT
PACK TOTAL JOINT (CUSTOM PROCEDURE TRAY) ×2 IMPLANT
PADDING CAST COTTON 6X4 STRL (CAST SUPPLIES) ×2 IMPLANT
POSITIONER SURGICAL ARM (MISCELLANEOUS) ×2 IMPLANT
SET HNDPC FAN SPRY TIP SCT (DISPOSABLE) ×1 IMPLANT
SPONGE GAUZE 4X4 12PLY (GAUZE/BANDAGES/DRESSINGS) ×1 IMPLANT
STRIP CLOSURE SKIN 1/2X4 (GAUZE/BANDAGES/DRESSINGS) ×1 IMPLANT
SUT MNCRL AB 4-0 PS2 18 (SUTURE) ×2 IMPLANT
SUT VIC AB 2-0 CT1 27 (SUTURE) ×4
SUT VIC AB 2-0 CT1 TAPERPNT 27 (SUTURE) ×2 IMPLANT
SYR 20CC LL (SYRINGE) ×1 IMPLANT
SYR 50ML LL SCALE MARK (SYRINGE) ×1 IMPLANT
TOWEL OR 17X26 10 PK STRL BLUE (TOWEL DISPOSABLE) ×4 IMPLANT
TRAY FOLEY CATH 14FRSI W/METER (CATHETERS) ×2 IMPLANT

## 2013-01-03 NOTE — Interval H&P Note (Signed)
History and Physical Interval Note:  01/03/2013 9:27 AM  Roger Cameron  has presented today for surgery, with the diagnosis of MEDIAL COMPARTMENT OA OF RIGHT KNEE  The various methods of treatment have been discussed with the patient and family. After consideration of risks, benefits and other options for treatment, the patient has consented to  Procedure(s): RIGHT KNEE MEDIAL COMPARTMENT UNI COMPARMENTAL ARTHROPLASTY (Right) as a surgical intervention .  The patient's history has been reviewed, patient examined, no change in status, stable for surgery.  I have reviewed the patient's chart and labs.  Questions were answered to the patient's satisfaction.     Loanne Drilling

## 2013-01-03 NOTE — Anesthesia Postprocedure Evaluation (Signed)
Anesthesia Post Note  Patient: Roger Cameron  Procedure(s) Performed: Procedure(s) (LRB): RIGHT KNEE MEDIAL COMPARTMENT UNI COMPARMENTAL ARTHROPLASTY (Right)  Anesthesia type: Spinal  Patient location: PACU  Post pain: Pain level controlled  Post assessment: Post-op Vital signs reviewed  Last Vitals: BP 170/72  Pulse 46  Temp(Src) 36.4 C (Oral)  Resp 13  SpO2 100%  Post vital signs: Reviewed  Level of consciousness: sedated  Complications: No apparent anesthesia complications

## 2013-01-03 NOTE — Op Note (Signed)
NAME:  Roger Cameron, Roger Cameron NO.:  [id number]  MEDICAL RECORD NO.:  [id number]  LOCATION:  1614                         FACILITY:  [facility]  PHYSICIAN:  [name], M.D.    DATE OF BIRTH:  [birth year]  DATE OF PROCEDURE:  01/03/2013 DATE OF DISCHARGE:                              OPERATIVE REPORT   PREOPERATIVE DIAGNOSIS:  Medial compartment arthritis, right knee.  POSTOPERATIVE DIAGNOSIS:  Medial compartment arthritis, right knee.  PROCEDURE:  Right knee medial unicompartmental arthroplasty.  SURGEON:  [name], M.D.  ASSISTANT:  Dimitri Ped, [place]  ANESTHESIA:  Spinal.  ESTIMATED BLOOD LOSS:  Minimal.  DRAINS:  Hemovac x1.  TOURNIQUET TIME:  38 minutes at 300 mmHg.  COMPLICATIONS:  None.  CONDITION:  Stable to recovery.  BRIEF CLINICAL NOTE:  Roger Cameron is an 79 year old male who has significant medial compartment arthritis of his right knee.  He has had a previous arthroscopy x2 showing exposed bone in the medial compartment.  He has failed nonoperative management, including corticosteroid and viscous supplement injections.  The rest of his knee looks fairly normal.  He presents now for medial compartment unicompartmental arthroplasty.  PROCEDURE IN DETAIL:  After successful administration of spinal anesthetic, a tourniquet was placed on his right thigh and his right lower extremity was prepped and draped in the usual sterile fashion. Extremity is wrapped in Esmarch, knee flexed, and tourniquet inflated to 300 mmHg.  Standard midline incision was made with a 10 blade through the subcutaneous tissue to the level of the extensor mechanism.  A fresh blade is used to make a medial parapatellar arthrotomy.  Soft tissue over the proximal medial tibia was then subperiosteally elevated to the joint line with a knife and into the semimembranosus bursa with a Cobb elevator.  The patella was then everted laterally and the knee flexed 90 degrees.  ACL was  intact.  Patellofemoral lateral compartments looked normal.  He does have exposed bone on the medial tibial plateau, and medial femoral condyle.  The tibial cutting guide for the conformis system was then placed on the tibia in the edge of __________ .  Then used a ring curette to remove all the cartilage off of the medial femoral condyle within the borders created by that area and that was marked.  The osteophytes were then removed off the margin and the femur medially and the margin of the tibia medially.  Intercondylar osteophytes were also removed and there were minimal intercondylar osteophytes.  The cutting guide was placed, and then pinned into position.  The 2 lug holes were then drilled.  We then made our posterior femoral cut utilizing an oscillating saw off of the patient specific jig.  The balancing chip was then placed and is balanced for the size B.  This is both in full extension and throughout flexion.  We then placed our retractors around the medial proximal tibia.  The tibial cutting guide was then placed after confirming that we had the appropriate size.  The block is pinned in the position for the patient's specific block.  The reciprocating saw was used to make the vertical cut just adjacent to the tibial spine and then the  oscillating saw was used to make the horizontal cut.  The cut bone was then removed.  Prior to this, I had taken out the medial meniscus and we confirmed that it was completely out after I completed the cut.  The trial for the tibia was then placed and had perfect fit on the cut tibial surface.  We then did our 2 lug drills into the tibia and keel punch into the tibia.  We then placed the tibial trial as well as the femoral trial.  It was placed with a 6 mm insert.  Full extension was achieved with excellent balance throughout full range of motion.  The cut bone surfaces were then prepared with pulsatile lavage.  Cement was mixed.  The tibial  component was cemented in first and impacted and extruded cement removed.  Femoral component was then cemented and impacted.  The trial 6 mm inserts placed, knee held in full extension. All extruded cement removed.  Once cement hardened, then the permanent 6 mm polyethylene insert was placed into the tibial tray.  Wound was then copiously irrigated with saline solution.  All extruded cement is removed.  Arthrotomy was then closed over Hemovac drain with a running #1 V-Loc suture.  The tourniquet was released, total time of 38 minutes. Subcu was then closed with interrupted 2-0 Vicryl, and subcuticular closed with running 4-0 Monocryl.  Prior to closure, I had injected the deep tissues and arthrotomy site with a total of 20 mL of Exparel mixed with 20 mL of saline and then after that, injected with an additional 20 mL of 0.25% bupivacaine.  Note that after closure, incision was cleaned and dry.  Drain hooked to suction.  Steri-Strips and a bulky sterile dressing applied.  He was placed into a knee immobilizer, awakened, and transported to recovery in stable condition.     [name], M.D.     FA/MEDQ  D:  01/03/2013  T:  01/03/2013  Job:  [id number]

## 2013-01-03 NOTE — Transfer of Care (Signed)
Immediate Anesthesia Transfer of Care Note  Patient: Roger Cameron  Procedure(s) Performed: Procedure(s): RIGHT KNEE MEDIAL COMPARTMENT UNI COMPARMENTAL ARTHROPLASTY (Right)  Patient Location: PACU  Anesthesia Type:MAC and Spinal  Level of Consciousness: awake, alert , oriented and patient cooperative  Airway & Oxygen Therapy: Patient Spontanous Breathing and Patient connected to face mask oxygen  Post-op Assessment: Report given to PACU RN, Post -op Vital signs reviewed and stable and Patient moving all extremities  Post vital signs: Reviewed and stable  Complications: No apparent anesthesia complications

## 2013-01-03 NOTE — Progress Notes (Signed)
Utilization review completed.  

## 2013-01-03 NOTE — Anesthesia Preprocedure Evaluation (Addendum)
Anesthesia Evaluation  Patient identified by MRN, date of birth, ID band Patient awake    Reviewed: Allergy & Precautions, H&P , NPO status , Patient's Chart, lab work & pertinent test results, reviewed documented beta blocker date and time   History of Anesthesia Complications (+) AWARENESS UNDER ANESTHESIA  Airway       Dental  (+) Dental Advisory Given   Pulmonary neg pulmonary ROS,          Cardiovascular hypertension, Pt. on medications and Pt. on home beta blockers negative cardio ROS      Neuro/Psych PSYCHIATRIC DISORDERS Anxiety negative neurological ROS     GI/Hepatic Neg liver ROS, GERD-  Medicated,  Endo/Other  negative endocrine ROS  Renal/GU negative Renal ROS     Musculoskeletal negative musculoskeletal ROS (+)   Abdominal   Peds  Hematology negative hematology ROS (+)   Anesthesia Other Findings   Reproductive/Obstetrics                          Anesthesia Physical Anesthesia Plan  ASA: II  Anesthesia Plan: Spinal   Post-op Pain Management:    Induction:   Airway Management Planned: Simple Face Mask  Additional Equipment:   Intra-op Plan:   Post-operative Plan:   Informed Consent: I have reviewed the patients History and Physical, chart, labs and discussed the procedure including the risks, benefits and alternatives for the proposed anesthesia with the patient or authorized representative who has indicated his/her understanding and acceptance.   Dental advisory given  Plan Discussed with: CRNA  Anesthesia Plan Comments:        Anesthesia Quick Evaluation

## 2013-01-03 NOTE — H&P (View-Only) (Signed)
Roger Cameron. [place]  DOB: 18-Nov-1928 Undefined / Language: [place] / Race: White Male  Date of Admission:  01/03/2013  Chief Complaint:  Right Knee Pain  History of Present Illness The patient is a 79 year old male who comes in for a preoperative History and Physical. The patient is scheduled for a right medial compartment unicompartmental arthroplasty at [facility] on 01/03/2013. The patient is a 79 year old male who presents for follow up of their knee. The patient is being followed for their right knee pain and osteoarthritis. They are now out from Synvisc. Symptoms reported today include: pain (with weightbearing, more severe in morning when first getting up). The following medication has been used for pain control: none. The patient indicates that they have questions or concerns today regarding options. Mr. Bartoszek said he is getting progressively worse. When we had scoped his knee last year he had a horrible retear of his meniscus and had fairly significant degenerative change in that medial compartment. Eventually he had good improvement in his pain but then was having worsening discomfort in the fall necessitating a series of Synvisc injections. Unfortunately Synvisc did not provide a tremendous amount of relief. He is getting harder for him to do what he desires. He has pain with almost all activities. He is starting to get some pain at night. The knee is not giving out on him. He is an avid exercises and has not been able to do so because of the knee pain. He is ready to get the knee fixed. They have been treated conservatively in the past for the above stated problem and despite conservative measures, they continue to have progressive pain and severe functional limitations and dysfunction. They have failed non-operative management including home exercise, medications, and injections. It is felt that they would benefit from undergoing total joint replacement. Risks and  benefits of the procedure have been discussed with the patient and they elect to proceed with surgery. There are no active contraindications to surgery such as ongoing infection or rapidly progressive neurological disease.  Problem List Primary osteoarthritis of one knee (715.16)   Allergies No Known Drug Allergies. 12/29/2011   Family History Cancer. mother Heart Disease. sister Other medical problems. TB   Social History Marital status. widowed Number of flights of stairs before winded. 4-5 Pain Contract. no Exercise. Exercises daily; does gym / weights Illicit drug use. no Living situation. live alone Previously in rehab. no Tobacco / smoke exposure. no Tobacco use. never smoker Drug/Alcohol Rehab (Currently). no Alcohol use. current drinker; drinks beer; only occasionally per week Children. 1 Current work status. retired Museum/gallery exhibitions officer. Plan is to go home with daughter who will be staying with him for several weeks. Advance Directives. Living Will   Medication History Atenolol (50MG  Tablet, Oral) Active. Diovan (320MG  Tablet, Oral) Active. Prevacid (30MG  Capsule DR, Oral) Active. BC Fast Pain Relief (650-195-33.3MG  Packet, Oral) Active. Hydrochlorothiazide (25MG  Tablet, Oral) Active. Latanoprost (0.005% Solution, Ophthalmic) Active. ALPRAZolam (0.5MG  Tablet, Oral) Active.   Past Surgical History Arthroscopy of Knee. right Cataract Surgery. right Inguinal Hernia Repair. laparoscopic: right   Medical History Impaired Hearing Anxiety Disorder Glaucoma Cataract Gastroesophageal Reflux Disease High blood pressure   Review of Systems General:Not Present- Chills, Fever, Night Sweats, Fatigue, Weight Gain, Weight Loss and Memory Loss. Skin:Not Present- Hives, Itching, Rash, Eczema and Lesions. HEENT:Not Present- Tinnitus, Headache, Double Vision, Visual Loss, Hearing Loss and Dentures. Respiratory:Not Present- Shortness  of breath with exertion, Shortness of breath at rest, Allergies,  Coughing up blood and Chronic Cough. Cardiovascular:Not Present- Chest Pain, Racing/skipping heartbeats, Difficulty Breathing Lying Down, Murmur, Swelling and Palpitations. Gastrointestinal:Present- Heartburn. Not Present- Bloody Stool, Abdominal Pain, Vomiting, Nausea, Constipation, Diarrhea, Difficulty Swallowing, Jaundice and Loss of appetitie. Male Genitourinary:Present- Urinary frequency and Urinating at Night. Not Present- Blood in Urine, Weak urinary stream, Discharge, Flank Pain, Incontinence, Painful Urination, Urgency and Urinary Retention. Musculoskeletal:Present- Joint Pain. Not Present- Muscle Weakness, Muscle Pain, Joint Swelling, Back Pain, Morning Stiffness and Spasms. Neurological:Not Present- Tremor, Dizziness, Blackout spells, Paralysis, Difficulty with balance and Weakness. Psychiatric:Not Present- Insomnia.   Vitals Weight: 210 lb Height: 73 in Weight was reported by patient. Height was reported by patient. Body Surface Area: 2.21 m Body Mass Index: 27.71 kg/m Pulse: 64 (Regular) Resp.: 16 (Unlabored) BP: 142/88 (Sitting, Left Arm, Standard)    Physical Exam The physical exam findings are as follows:  Note: Patient is an 79 year old male with continued knee pain.   General Mental Status - Alert, cooperative and good historian. General Appearance- pleasant. Not in acute distress. Orientation- Oriented X3. Build & Nutrition- Well nourished and Well developed.   Head and Neck Head- normocephalic, atraumatic . Neck Global Assessment- supple. no bruit auscultated on the right and no bruit auscultated on the left.   Eye Pupil- Bilateral- Regular and Round. Motion- Bilateral- EOMI.   ENMT  upper and lower partial dentures  Chest and Lung Exam Auscultation: Breath sounds:- clear at anterior chest wall and - clear at posterior chest wall. Adventitious  sounds:- No Adventitious sounds.   Cardiovascular Auscultation:Rhythm- Regular rate and rhythm. Heart Sounds- S1 WNL and S2 WNL. Murmurs & Other Heart Sounds: Murmur 1:Location- Aortic Area. Timing- Mid-systolic. Grade- II/VI.   Abdomen Palpation/Percussion:Tenderness- Abdomen is non-tender to palpation. Rigidity (guarding)- Abdomen is soft. Auscultation:Auscultation of the abdomen reveals - Bowel sounds normal.   Male Genitourinary Not done, not pertinent to present illness  Musculoskeletal On exam well developed male alert and oriented in no apparent distress. Evaluation of his hips normal range of motion with no discomfort. The left knee range is about 0 to 135. No swelling tenderness or instability. Right knee range 10 to 125. Moderate crepitus on range of motion. Significant medial tenderness. No lateral tenderness or instability.  RADIOGRAPHS: Radiographs performed today, AP both knees and lateral of the right show the left knee is normal. The right knee has significant medial joint space narrowing, not fully bone on bone but close. On the lateral view the patellofemoral joint is also very narrow.  Assessment & Plan Primary osteoarthritis of one knee (715.16) Impression: Right Knee Medial Compartment  Note: Plan is for a Right Knee Medial Compartment Unicompartmental Replacement by Dr. Lequita Halt.  Plan is to go home with daughter who sill stay with him.  PCP - Dr. Wylene Simmer  Signed electronically by Lauraine Rinne, III PA-C

## 2013-01-03 NOTE — Preoperative (Signed)
Beta Blockers   Reason not to administer Beta Blockers:Not Applicable Pt took Beta Blocker this morning 01-03-13

## 2013-01-03 NOTE — Evaluation (Signed)
Physical Therapy Evaluation Patient Details Name: Roger Cameron MRN: [id number] DOB: [birth year] Today's Date: 01/03/2013 Time: 8295-6213 PT Time Calculation (min): 19 min  PT Assessment / Plan / Recommendation Clinical Impression  Pt is an 79 year old male s/p R TKR POD #0.  Pt would benefit from acute PT services in order to improve independence with transfers, ambulation, and step by increasing R LE strength and ROM to prepare for d/c home with daughter to assist.  Pt did very well POD 0.    PT Assessment  Patient needs continued PT services    Follow Up Recommendations  Home health PT    Does the patient have the potential to tolerate intense rehabilitation      Barriers to Discharge        Equipment Recommendations  None recommended by PT    Recommendations for Other Services     Frequency 7X/week    Precautions / Restrictions Precautions Precautions: Knee Required Braces or Orthoses: Knee Immobilizer - Right Knee Immobilizer - Right: Discontinue once straight leg raise with < 10 degree lag Restrictions Other Position/Activity Restrictions: WBAT   Pertinent Vitals/Pain Premedicated, ice packs applied, repositioned      Mobility  Bed Mobility Bed Mobility: Supine to Sit Supine to Sit: 4: Min assist Details for Bed Mobility Assistance: verbal cues for technique, mostly UE placement to assist, assist for R LE Transfers Transfers: Sit to Stand;Stand to Sit Sit to Stand: 4: Min assist;With upper extremity assist;From bed;From elevated surface Stand to Sit: With upper extremity assist;4: Min guard;To chair/3-in-1 Details for Transfer Assistance: verbal cues for safe technique, assist to rise Ambulation/Gait Ambulation/Gait Assistance: 4: Min guard Ambulation Distance (Feet): 60 Feet Assistive device: Rolling walker Ambulation/Gait Assistance Details: verbal cues for safe technique, sequence, step length Gait Pattern: Step-to pattern;Decreased stance time -  right;Antalgic    Exercises     PT Diagnosis: Difficulty walking  PT Problem List: Decreased strength;Decreased range of motion;Decreased mobility;Decreased knowledge of precautions;Decreased knowledge of use of DME PT Treatment Interventions: DME instruction;Gait training;Stair training;Functional mobility training;Patient/family education;Therapeutic activities;Therapeutic exercise   PT Goals Acute Rehab PT Goals PT Goal Formulation: With patient Time For Goal Achievement: 01/10/13 Potential to Achieve Goals: Good Pt will go Supine/Side to Sit: with modified independence PT Goal: Supine/Side to Sit - Progress: Goal set today Pt will go Sit to Stand: with modified independence PT Goal: Sit to Stand - Progress: Goal set today Pt will go Stand to Sit: with modified independence PT Goal: Stand to Sit - Progress: Goal set today Pt will Ambulate: 51 - 150 feet;with modified independence;with least restrictive assistive device PT Goal: Ambulate - Progress: Goal set today Pt will Go Up / Down Stairs: 1-2 stairs;with supervision;with least restrictive assistive device PT Goal: Up/Down Stairs - Progress: Goal set today Pt will Perform Home Exercise Program: with supervision, verbal cues required/provided PT Goal: Perform Home Exercise Program - Progress: Goal set today  Visit Information  Last PT Received On: 01/03/13 Assistance Needed: +1    Subjective Data  Subjective: It feels better than it did before surgery. Patient Stated Goal: home asap (joking about going home tonight)   Prior Functioning  Home Living Available Help at Discharge: Family Type of Home: House Home Access: Stairs to enter Secretary/administrator of Steps: 1 Entrance Stairs-Rails: None Home Layout: One level Home Adaptive Equipment: Walker - rolling;Straight cane;Wheelchair - manual;Bedside commode/3-in-1 Prior Function Level of Independence: Independent with assistive device(s) Communication Communication:  No difficulties  Cognition  Cognition Arousal/Alertness: Awake/alert Behavior During Therapy: WFL for tasks assessed/performed Overall Cognitive Status: Within Functional Limits for tasks assessed    Extremity/Trunk Assessment Right Lower Extremity Assessment RLE ROM/Strength/Tone: Deficits RLE ROM/Strength/Tone Deficits: R knee AAROM -8-40* limited by pain supine Left Lower Extremity Assessment LLE ROM/Strength/Tone: WFL for tasks assessed   Balance    End of Session PT - End of Session Equipment Utilized During Treatment: Right knee immobilizer Activity Tolerance: Patient tolerated treatment well Patient left: in chair;with family/visitor present;with call bell/phone within reach CPM Right Knee CPM Right Knee: Off (PT took off at  1658) Right Knee Flexion (Degrees): 40 Right Knee Extension (Degrees): 10 Additional Comments: 4 hours per day  GP Functional Assessment Tool Used: clinical judgement Functional Limitation: Mobility: Walking and moving around Mobility: Walking and Moving Around Current Status (Z6109): At least 20 percent but less than 40 percent impaired, limited or restricted Mobility: Walking and Moving Around Goal Status [id number]): At least 1 percent but less than 20 percent impaired, limited or restricted   Gudrun Axe,[name] 01/03/2013, 5:37 PM [name], PT, DPT 01/03/2013 Pager: [phone]

## 2013-01-03 NOTE — Anesthesia Procedure Notes (Addendum)
Spinal  Patient location during procedure: OR Start time: 01/03/2013 11:24 AM End time: 01/03/2013 11:34 AM Staffing Anesthesiologist: Lewie Loron R Performed by: anesthesiologist  Preanesthetic Checklist Completed: patient identified, site marked, surgical consent, pre-op evaluation, timeout performed, IV checked, risks and benefits discussed and monitors and equipment checked Spinal Block Patient position: sitting Prep: ChloraPrep Patient monitoring: heart rate, continuous pulse ox and blood pressure Location: L4-5 Injection technique: single-shot Needle Needle type: Sprotte  Needle gauge: 24 G Needle length: 9 cm Assessment Sensory level: T8 Events: other event Additional Notes Expiration date of kit checked and confirmed. First attempt at L3-4, no free flowing of CSF. Needle pulled back and entered epidural vein. Moved level down, single stick without issue, clear CSF, easily aspirated.  Patient tolerated procedure well, without complications.

## 2013-01-03 NOTE — Brief Op Note (Signed)
01/03/2013  12:37 PM  PATIENT:  Roger Cameron  79 y.o. male  PRE-OPERATIVE DIAGNOSIS:  MEDIAL COMPARTMENT OA OF RIGHT KNEE  POST-OPERATIVE DIAGNOSIS:  MEDIAL COMPARTMENT OA OF RIGHT KNEE  PROCEDURE:  Procedure(s): RIGHT KNEE MEDIAL COMPARTMENT UNI COMPARMENTAL ARTHROPLASTY (Right)  SURGEON:  Surgeon(s) and Role:    * Loanne Drilling, MD - Primary  PHYSICIAN ASSISTANT:   ASSISTANTS: Dimitri Ped, PA-C   ANESTHESIA:   spinal  EBL:  Total I/O In: 1000 [I.V.:1000] Out: -   BLOOD ADMINISTERED:none  DRAINS: (Medium) Hemovact drain(s) in the right knee with  Suction Open   LOCAL MEDICATIONS USED:  OTHER Exparel and Marcaine   COUNTS:  YES  TOURNIQUET:  38 minutes @ 300 mm Hg  DICTATION: .Other Dictation: Dictation Number [id number]  PLAN OF CARE: Admit for overnight observation  PATIENT DISPOSITION:  PACU - hemodynamically stable.

## 2013-01-04 ENCOUNTER — Encounter (HOSPITAL_COMMUNITY): Payer: Self-pay | Admitting: Orthopedic Surgery

## 2013-01-04 DIAGNOSIS — Z79899 Other long term (current) drug therapy: Secondary | ICD-10-CM | POA: Diagnosis not present

## 2013-01-04 DIAGNOSIS — I10 Essential (primary) hypertension: Secondary | ICD-10-CM | POA: Diagnosis not present

## 2013-01-04 DIAGNOSIS — K219 Gastro-esophageal reflux disease without esophagitis: Secondary | ICD-10-CM | POA: Diagnosis not present

## 2013-01-04 DIAGNOSIS — M1710 Unilateral primary osteoarthritis, unspecified knee: Secondary | ICD-10-CM | POA: Diagnosis not present

## 2013-01-04 LAB — BASIC METABOLIC PANEL
Chloride: 102 mEq/L (ref 96–112)
Creatinine, Ser: 0.78 mg/dL (ref 0.50–1.35)
GFR calc Af Amer: >90 mL/min (ref >90)
Potassium: 4.3 mEq/L (ref 3.5–5.1)
Sodium: 135 mEq/L (ref 135–145)

## 2013-01-04 LAB — CBC
Platelets: 196 10*3/uL (ref 150–400)
RBC: 4.32 MIL/uL (ref 4.22–5.81)
RDW: 12.8 % (ref 11.5–15.5)
WBC: 7.2 10*3/uL (ref 4.0–10.5)

## 2013-01-04 MED ORDER — OXYCODONE HCL 5 MG PO TABS: 5.0000 mg | ORAL_TABLET | ORAL | Status: DC | PRN

## 2013-01-04 MED ORDER — TRAMADOL HCL 50 MG PO TABS: 50.0000 mg | ORAL_TABLET | Freq: Four times a day (QID) | ORAL | Status: DC | PRN

## 2013-01-04 MED ORDER — RIVAROXABAN 10 MG PO TABS: 10.0000 mg | ORAL_TABLET | Freq: Every day | ORAL | Status: DC

## 2013-01-04 MED ORDER — METHOCARBAMOL 500 MG PO TABS: 500.0000 mg | ORAL_TABLET | Freq: Four times a day (QID) | ORAL | Status: DC | PRN

## 2013-01-04 NOTE — Plan of Care (Signed)
Problem: Consults Goal: Diagnosis- Total Joint Replacement Outcome: Completed/Met Date Met:  01/04/13 Primary Total Knee RIGHT     

## 2013-01-04 NOTE — Progress Notes (Signed)
Physical Therapy Treatment Patient Details Name: Roger Cameron MRN: [id number] DOB: January 05, 1929 Today's Date: 01/04/2013 Time: 4098-1191 PT Time Calculation (min): 24 min  PT Assessment / Plan / Recommendation Comments on Treatment Session  Pt ambulated, practiced one step, and performed exercises.    Follow Up Recommendations  Home health PT     Does the patient have the potential to tolerate intense rehabilitation     Barriers to Discharge        Equipment Recommendations  None recommended by PT    Recommendations for Other Services    Frequency     Plan Discharge plan remains appropriate;Frequency remains appropriate    Precautions / Restrictions Precautions Precautions: Knee Required Braces or Orthoses: Knee Immobilizer - Right Knee Immobilizer - Right: Discontinue once straight leg raise with < 10 degree lag Restrictions Other Position/Activity Restrictions: WBAT   Pertinent Vitals/Pain Premedicated, reports sore R knee, ice packs applied    Mobility  Bed Mobility Bed Mobility: Not assessed Transfers Transfers: Sit to Stand;Stand to Sit Sit to Stand: 4: Min guard;With upper extremity assist;From chair/3-in-1 Stand to Sit: 4: Min guard;With upper extremity assist;To chair/3-in-1 Details for Transfer Assistance: verbal cues for safe technique Ambulation/Gait Ambulation/Gait Assistance: 4: Min guard Ambulation Distance (Feet): 200 Feet Assistive device: Rolling walker Ambulation/Gait Assistance Details: verbal cues for RW distance Gait Pattern: Decreased stance time - right;Antalgic;Step-through pattern Stairs: Yes Stairs Assistance: 4: Min guard Stairs Assistance Details (indicate cue type and reason): verbal cues for safe technique and sequence Stair Management Technique: Step to pattern;Forwards;With walker Number of Stairs: 1    Exercises Total Joint Exercises Ankle Circles/Pumps: AROM;10 reps;Both Quad Sets: AROM;10 reps;Both Short Arc Quad:  AROM;Right;10 reps Heel Slides: Seated;AROM;10 reps Hip ABduction/ADduction: AROM;Right;10 reps Straight Leg Raises: AROM;10 reps;Right;AAROM Goniometric ROM: R knee AROM -6-80*    PT Diagnosis:    PT Problem List:   PT Treatment Interventions:     PT Goals Acute Rehab PT Goals PT Goal: Sit to Stand - Progress: Progressing toward goal PT Goal: Stand to Sit - Progress: Progressing toward goal PT Goal: Ambulate - Progress: Progressing toward goal PT Goal: Up/Down Stairs - Progress: Progressing toward goal PT Goal: Perform Home Exercise Program - Progress: Progressing toward goal  Visit Information  Last PT Received On: 01/04/13 Assistance Needed: +1    Subjective Data  Subjective: I'm ready to go.   Cognition  Cognition Arousal/Alertness: Awake/alert Behavior During Therapy: WFL for tasks assessed/performed Overall Cognitive Status: Within Functional Limits for tasks assessed    Balance     End of Session PT - End of Session Equipment Utilized During Treatment: Right knee immobilizer Activity Tolerance: Patient tolerated treatment well Patient left: in chair;with family/visitor present;with call bell/phone within reach   GP     Roger Cameron,Roger Cameron 01/04/2013, 12:54 PM Roger Cameron, PT, DPT 01/04/2013 Pager: [phone]

## 2013-01-04 NOTE — Progress Notes (Signed)
OT Cancellation Note  Patient Details Name: Roger Cameron MRN: [id number] DOB: [birth year]   Cancelled Treatment:     Pt screened for OT.  He has had arthroscopic sxs, has daughter staying with him and all DME.  Verbalizes that he is comfortable with shower.    Navy Belay 01/04/2013, 8:48 AM Marica Otter, OTR/L [phone] 01/04/2013

## 2013-01-04 NOTE — Discharge Summary (Signed)
Physician Discharge Summary   Patient ID: NYEEM STOKE MRN: [id number] DOB/AGE: November 06, 1928 79 y.o.  Admit date: 01/03/2013 Discharge date: 01/04/2013  Primary Diagnosis:  Medial compartment arthritis, right knee.  Admission Diagnoses:  Past Medical History  Diagnosis Date  . Impaired hearing     NO HEARING AIDS  . Anxiety   . Glaucoma     BILATERAL  . Cataract     LEFT EYE  . Hypertension   . GERD (gastroesophageal reflux disease)   . Arthritis    Discharge Diagnoses:   Principal Problem:   OA (osteoarthritis) of knee  Estimated body mass index is 27.39 kg/(m^2) as calculated from the following:   Height as of this encounter: 6' (1.829 m).   Weight as of this encounter: 91.627 kg (202 lb).  Procedure:  Procedure(s) (LRB): RIGHT KNEE MEDIAL COMPARTMENT UNI COMPARMENTAL ARTHROPLASTY (Right)   Consults: None  HPI: Mr. Roger Cameron is an 79 year old male who has  significant medial compartment arthritis of his right knee. He has had  a previous arthroscopy x2 showing exposed bone in the medial  compartment. He has failed nonoperative management, including  corticosteroid and viscous supplement injections. The rest of his knee  looks fairly normal. He presents now for medial compartment  unicompartmental arthroplasty.  Laboratory Data: Admission on 01/03/2013, Discharged on 01/04/2013  Component Date Value Range Status  . WBC 01/04/2013 7.2  4.0 - 10.5 K/uL Final  . RBC 01/04/2013 4.32  4.22 - 5.81 MIL/uL Final  . Hemoglobin 01/04/2013 13.6  13.0 - 17.0 g/dL Final  . HCT 40/34/7425 38.9* 39.0 - 52.0 % Final  . MCV 01/04/2013 90.0  78.0 - 100.0 fL Final  . MCH 01/04/2013 31.5  26.0 - 34.0 pg Final  . MCHC 01/04/2013 35.0  30.0 - 36.0 g/dL Final  . RDW 95/63/8756 12.8  11.5 - 15.5 % Final  . Platelets 01/04/2013 196  150 - 400 K/uL Final  . Sodium 01/04/2013 135  135 - 145 mEq/L Final  . Potassium 01/04/2013 4.3  3.5 - 5.1 mEq/L Final  . Chloride 01/04/2013 102  96 -  112 mEq/L Final  . CO2 01/04/2013 26  19 - 32 mEq/L Final  . Glucose, Bld 01/04/2013 160* 70 - 99 mg/dL Final  . BUN 43/32/9518 8  6 - 23 mg/dL Final  . Creatinine, Ser 01/04/2013 0.78  0.50 - 1.35 mg/dL Final  . Calcium 84/16/6063 8.9  8.4 - 10.5 mg/dL Final  . GFR calc non Af Amer 01/04/2013 82* >90 mL/min Final  . GFR calc Af Amer 01/04/2013 >90  >90 mL/min Final   Comment:                                 The eGFR has been calculated                          using the CKD EPI equation.                          This calculation has not been                          validated in all clinical  situations.                          eGFR's persistently                          <90 mL/min signify                          possible Chronic Kidney Disease.  Hospital Outpatient Visit on 12/29/2012  Component Date Value Range Status  . MRSA, PCR 12/29/2012 NEGATIVE  NEGATIVE Final  . Staphylococcus aureus 12/29/2012 NEGATIVE  NEGATIVE Final   Comment:                                 The Xpert SA Assay (FDA                          approved for NASAL specimens                          in patients over 34 years of age),                          is one component of                          a comprehensive surveillance                          program.  Test performance has                          been validated by [employer] for patients greater                          than or equal to 16 year old.                          It is not intended                          to diagnose infection nor to                          guide or monitor treatment.  [name] aPTT 12/29/2012 34  24 - 37 seconds Final  . WBC 12/29/2012 8.6  4.0 - 10.5 K/uL Final  . RBC 12/29/2012 4.94  4.22 - 5.81 MIL/uL Final  . Hemoglobin 12/29/2012 16.0  13.0 - 17.0 g/dL Final  . HCT 47/82/9562 44.5  39.0 - 52.0 % Final  . MCV 12/29/2012 90.1  78.0 - 100.0 fL Final  . MCH  12/29/2012 32.4  26.0 - 34.0 pg Final  . MCHC 12/29/2012 36.0  30.0 - 36.0 g/dL Final  . RDW 13/02/6577 12.9  11.5 - 15.5 % Final  . Platelets 12/29/2012 216  150 - 400 K/uL Final  . Sodium 12/29/2012 136  135 -  145 mEq/L Final  . Potassium 12/29/2012 4.5  3.5 - 5.1 mEq/L Final  . Chloride 12/29/2012 99  96 - 112 mEq/L Final  . CO2 12/29/2012 27  19 - 32 mEq/L Final  . Glucose, Bld 12/29/2012 97  70 - 99 mg/dL Final  . BUN 86/57/8469 9  6 - 23 mg/dL Final  . Creatinine, Ser 12/29/2012 0.83  0.50 - 1.35 mg/dL Final  . Calcium 62/95/2841 10.0  8.4 - 10.5 mg/dL Final  . Total Protein 12/29/2012 7.8  6.0 - 8.3 g/dL Final  . Albumin 32/44/0102 4.2  3.5 - 5.2 g/dL Final  . AST 72/53/6644 21  0 - 37 U/L Final  . ALT 12/29/2012 12  0 - 53 U/L Final  . Alkaline Phosphatase 12/29/2012 64  39 - 117 U/L Final  . Total Bilirubin 12/29/2012 0.8  0.3 - 1.2 mg/dL Final  . GFR calc non Af Amer 12/29/2012 80* >90 mL/min Final  . GFR calc Af Amer 12/29/2012 >90  >90 mL/min Final   Comment:                                 The eGFR has been calculated                          using the CKD EPI equation.                          This calculation has not been                          validated in all clinical                          situations.                          eGFR's persistently                          <90 mL/min signify                          possible Chronic Kidney Disease.  [name] Prothrombin Time 12/29/2012 14.3  11.6 - 15.2 seconds Final  . INR 12/29/2012 1.13  0.00 - 1.49 Final  . ABO/RH(D) 12/29/2012 O POS   Final  . Antibody Screen 12/29/2012 NEG   Final  . Sample Expiration 12/29/2012 01/06/2013   Final  . Color, Urine 12/29/2012 YELLOW  YELLOW Final  . APPearance 12/29/2012 CLEAR  CLEAR Final  . Specific Gravity, Urine 12/29/2012 1.010  1.005 - 1.030 Final  . pH 12/29/2012 6.5  5.0 - 8.0 Final  . Glucose, UA 12/29/2012 NEGATIVE  NEGATIVE mg/dL Final  . Hgb urine dipstick 12/29/2012  NEGATIVE  NEGATIVE Final  . Bilirubin Urine 12/29/2012 NEGATIVE  NEGATIVE Final  . Ketones, ur 12/29/2012 NEGATIVE  NEGATIVE mg/dL Final  . Protein, ur 03/47/4259 NEGATIVE  NEGATIVE mg/dL Final  . Urobilinogen, UA 12/29/2012 0.2  0.0 - 1.0 mg/dL Final  . Nitrite 56/38/7564 NEGATIVE  NEGATIVE Final  . Leukocytes, UA 12/29/2012 TRACE* NEGATIVE Final  . Squamous Epithelial / LPF 12/29/2012 RARE  RARE Final  . WBC, UA 12/29/2012 0-2  <3 WBC/hpf  Final  . RBC / HPF 12/29/2012 0-2  <3 RBC/hpf Final  . Bacteria, UA 12/29/2012 RARE  RARE Final  . ABO/RH(D) 12/29/2012 O POS   Final     X-Rays:Dg Chest 2 View  12/29/2012   *RADIOLOGY REPORT*  Clinical Data: Preoperative evaluation for right knee arthroplasty, history hypertension  CHEST - 2 VIEW  Comparison: 10/14/2010  Findings: Upper normal heart size. Tortuous aorta. Pulmonary vascularity normal. Calcified granuloma right upper lobe with calcified right hilar adenopathy. Lungs otherwise clear. No pleural effusion or pneumothorax. Bones unremarkable.  IMPRESSION: Old granulomatous disease. No acute abnormalities.   Original Report Authenticated By: Ulyses Southward, M.D.    EKG: Orders placed during the hospital encounter of 01/03/13  . EKG     Hospital Course: DONNALD TABAR is a 79 y.o. who was admitted to [facility]. They were brought to the operating room on 01/03/2013 and underwent Procedure(s): RIGHT KNEE MEDIAL COMPARTMENT UNI COMPARMENTAL ARTHROPLASTY.  Patient tolerated the procedure well and was later transferred to the recovery room and then to the orthopaedic floor for postoperative care.  They were given PO and IV analgesics for pain control following their surgery.  They were given 24 hours of postoperative antibiotics of  Anti-infectives   Start     Dose/Rate Route Frequency Ordered Stop   01/03/13 1800  ceFAZolin (ANCEF) IVPB 1 g/50 mL premix     1 g 100 mL/hr over 30 Minutes Intravenous Every 6 hours 01/03/13 1452 01/04/13  0127   01/03/13 1045  ceFAZolin (ANCEF) IVPB 2 g/50 mL premix     2 g 100 mL/hr over 30 Minutes Intravenous On call to O.R. 01/03/13 1041 01/03/13 1131     and started on DVT prophylaxis in the form of Xarelto.   PT and OT were ordered for total joint protocol.  Discharge planning consulted to help with postop disposition and equipment needs.  Patient had a good night on the evening of surgery.  They started to get up OOB with therapy on day one and walked 200 feet. Hemovac drain was pulled without difficulty.   Patient was seen in rounds and was ready to go home later that same day.   Discharge Medications: Prior to Admission medications   Medication Sig Start Date End Date Taking? Authorizing Provider  acetaminophen (TYLENOL) 500 MG tablet Take 500 mg by mouth every 6 (six) hours as needed for pain.   Yes Historical Provider, MD  ALPRAZolam Prudy Feeler) 0.5 MG tablet Take 0.5 mg by mouth 3 (three) times daily as needed for sleep or anxiety.   Yes Historical Provider, MD  atenolol (TENORMIN) 50 MG tablet Take 25 mg by mouth every morning.   Yes Historical Provider, MD  hydrochlorothiazide (HYDRODIURIL) 25 MG tablet Take 25 mg by mouth every morning.   Yes Historical Provider, MD  lansoprazole (PREVACID) 30 MG capsule Take 30 mg by mouth every 14 (fourteen) days.   Yes Historical Provider, MD  latanoprost (XALATAN) 0.005 % ophthalmic solution Place 1 drop into both eyes at bedtime.   Yes Historical Provider, MD  valsartan (DIOVAN) 320 MG tablet Take 320 mg by mouth every morning.   Yes Historical Provider, MD  methocarbamol (ROBAXIN) 500 MG tablet Take 1 tablet (500 mg total) by mouth every 6 (six) hours as needed. 01/04/13   Alexzandrew Perkins, PA-C  oxyCODONE (OXY IR/ROXICODONE) 5 MG immediate release tablet Take 1-2 tablets (5-10 mg total) by mouth every 3 (three) hours as needed. 01/04/13   Alexzandrew  Perkins, PA-C  rivaroxaban (XARELTO) 10 MG TABS tablet Take 1 tablet (10 mg total) by mouth daily  with breakfast. Take Xarelto for two and a half more weeks, then discontinue Xarelto. 01/04/13   Alexzandrew Perkins, PA-C  traMADol (ULTRAM) 50 MG tablet Take 1-2 tablets (50-100 mg total) by mouth every 6 (six) hours as needed (mild pain). 01/04/13   Alexzandrew Julien Girt, PA-C    Diet: Cardiac diet Activity:WBAT Follow-up:in 2 weeks Disposition - Home Discharged Condition: good       Discharge Orders   Future Orders Complete By Expires     Call MD / Call 911  As directed     Comments:      If you experience chest pain or shortness of breath, CALL 911 and be transported to the hospital emergency room.  If you develope a fever above 101 F, pus (white drainage) or increased drainage or redness at the wound, or calf pain, call your surgeon's office.    Change dressing  As directed     Comments:      Change dressing daily with sterile 4 x 4 inch gauze dressing and apply TED hose. Do not submerge the incision under water.    Constipation Prevention  As directed     Comments:      Drink plenty of fluids.  Prune juice may be helpful.  You may use a stool softener, such as Colace (over the counter) 100 mg twice a day.  Use MiraLax (over the counter) for constipation as needed.    Diet - low sodium heart healthy  As directed     Discharge instructions  As directed     Comments:      Pick up stool softner and laxative for home. Do not submerge incision under water. May shower starting Thursday 01/06/2013 Continue to use ice for pain and swelling from surgery. May remove dressing and change daily starting Wed 01/05/2013  Take Xarelto for two and a half more weeks, then discontinue Xarelto.    Do not put a pillow under the knee. Place it under the heel.  As directed     Do not sit on low chairs, stoools or toilet seats, as it may be difficult to get up from low surfaces  As directed     Driving restrictions  As directed     Comments:      No driving until released by the physician.    Increase  activity slowly as tolerated  As directed     Lifting restrictions  As directed     Comments:      No lifting until released by the physician.    Patient may shower  As directed     Comments:      You may shower without a dressing once there is no drainage.  Do not wash over the wound.  If drainage remains, do not shower until drainage stops.    TED hose  As directed     Comments:      Use stockings (TED hose) for 3 weeks on both leg(s).  You may remove them at night for sleeping.    Weight bearing as tolerated  As directed         Medication List    STOP taking these medications       ARTHRITIS STRENGTH BC POWDER PO     multivitamin with minerals Tabs     VITAMIN C ER PO      TAKE these  medications       acetaminophen 500 MG tablet  Commonly known as:  TYLENOL  Take 500 mg by mouth every 6 (six) hours as needed for pain.     ALPRAZolam 0.5 MG tablet  Commonly known as:  XANAX  Take 0.5 mg by mouth 3 (three) times daily as needed for sleep or anxiety.     atenolol 50 MG tablet  Commonly known as:  TENORMIN  Take 25 mg by mouth every morning.     hydrochlorothiazide 25 MG tablet  Commonly known as:  HYDRODIURIL  Take 25 mg by mouth every morning.     lansoprazole 30 MG capsule  Commonly known as:  PREVACID  Take 30 mg by mouth every 14 (fourteen) days.     latanoprost 0.005 % ophthalmic solution  Commonly known as:  XALATAN  Place 1 drop into both eyes at bedtime.     methocarbamol 500 MG tablet  Commonly known as:  ROBAXIN  Take 1 tablet (500 mg total) by mouth every 6 (six) hours as needed.     oxyCODONE 5 MG immediate release tablet  Commonly known as:  Oxy IR/ROXICODONE  Take 1-2 tablets (5-10 mg total) by mouth every 3 (three) hours as needed.     rivaroxaban 10 MG Tabs tablet  Commonly known as:  XARELTO  Take 1 tablet (10 mg total) by mouth daily with breakfast. Take Xarelto for two and a half more weeks, then discontinue Xarelto.     traMADol 50  MG tablet  Commonly known as:  ULTRAM  Take 1-2 tablets (50-100 mg total) by mouth every 6 (six) hours as needed (mild pain).     valsartan 320 MG tablet  Commonly known as:  DIOVAN  Take 320 mg by mouth every morning.       Follow-up Information   Follow up with Loanne Drilling, MD. Schedule an appointment as soon as possible for a visit on 01/18/2013.   Contact information:   [address], [address] [place] [address] [phone]       Signed: Patrica Duel 01/19/2013, 9:11 PM

## 2013-01-04 NOTE — Progress Notes (Signed)
PT Treatment Note   01/04/13 1255  PT Visit Information  Last PT Received On 01/04/13  Assistance Needed +1  PT Time Calculation  PT Start Time 1151  PT Stop Time 1201  PT Time Calculation (min) 10 min  Subjective Data  Subjective Thank you!  Precautions  Precautions Knee  Required Braces or Orthoses Knee Immobilizer - Right  Knee Immobilizer - Right Discontinue once straight leg raise with < 10 degree lag  Restrictions  Other Position/Activity Restrictions WBAT  Cognition  Arousal/Alertness Awake/alert  Behavior During Therapy WFL for tasks assessed/performed  Overall Cognitive Status Within Functional Limits for tasks assessed  Bed Mobility  Bed Mobility Not assessed  Transfers  Transfers Sit to Stand;Stand to Sit  Sit to Stand 5: Supervision;With upper extremity assist;From chair/3-in-1  Stand to Sit 5: Supervision;With upper extremity assist;To chair/3-in-1  Details for Transfer Assistance verbal cue for R LE forward, performed without KI as pt able to perform SLR  Ambulation/Gait  Ambulation/Gait Assistance 5: Supervision  Ambulation Distance (Feet) 200 Feet  Assistive device Rolling walker  Ambulation/Gait Assistance Details verbal cue for heel strike, pt doing very well with mobility  Gait Pattern Step-through pattern  PT - End of Session  Activity Tolerance Patient tolerated treatment well  Patient left in chair;with family/visitor present;with call bell/phone within reach  PT - Assessment/Plan  Comments on Treatment Session Pt ambulated without KI and did very well.  Pt feels ready to d/c home today with daughter.  Verbally reviewed step technique with daughter and pt as well as car transfer.  Pt given exercises handout and had no questions.  PT Plan Discharge plan remains appropriate;Frequency remains appropriate  Follow Up Recommendations Home health PT  PT equipment None recommended by PT  Acute Rehab PT Goals  PT Goal: Sit to Stand - Progress Progressing  toward goal  PT Goal: Stand to Sit - Progress Progressing toward goal  PT Goal: Ambulate - Progress Progressing toward goal  PT General Charges  $$ ACUTE PT VISIT 1 Procedure  PT Treatments  $Gait Training 8-22 mins   [name], PT, DPT 01/04/2013 Pager: [phone]

## 2013-01-04 NOTE — Progress Notes (Signed)
   Subjective: 1 Day Post-Op Procedure(s) (LRB): RIGHT KNEE MEDIAL COMPARTMENT UNI COMPARMENTAL ARTHROPLASTY (Right) Patient reports pain as mild.   Patient seen in rounds for Dr. Lequita Halt. Patient is well, and has had no acute complaints or problems Patient is ready to go home later today.  Objective: Vital signs in last 24 hours: Temp:  [97.3 F (36.3 C)-98 F (36.7 C)] 97.4 F (36.3 C) (06/17 3086) Pulse Rate:  [45-59] 59 (06/17 0614) Resp:  [12-16] 16 (06/17 0614) BP: (128-183)/(71-87) 158/77 mmHg (06/17 0614) SpO2:  [92 %-100 %] 92 % (06/17 0614) Weight:  [91.627 kg (202 lb)] 91.627 kg (202 lb) (06/16 2055)  Intake/Output from previous day:  Intake/Output Summary (Last 24 hours) at 01/04/13 0849 Last data filed at 01/04/13 0655  Gross per 24 hour  Intake 4082.34 ml  Output   2225 ml  Net 1857.34 ml    Intake/Output this shift:    Labs:  Recent Labs  01/04/13 0447  HGB 13.6    Recent Labs  01/04/13 0447  WBC 7.2  RBC 4.32  HCT 38.9*  PLT 196    Recent Labs  01/04/13 0447  NA 135  K 4.3  CL 102  CO2 26  BUN 8  CREATININE 0.78  GLUCOSE 160*  CALCIUM 8.9   No results found for this basename: LABPT, INR,  in the last 72 hours  EXAM: General - Patient is Alert, Appropriate and Disorganized Extremity - Neurovascular intact Sensation intact distally Intact pulses distally No cellulitis present Dressing - intact, no drainage Motor Function - intact, moving foot and toes well on exam.   Assessment/Plan: 1 Day Post-Op Procedure(s) (LRB): RIGHT KNEE MEDIAL COMPARTMENT UNI COMPARMENTAL ARTHROPLASTY (Right) Procedure(s) (LRB): RIGHT KNEE MEDIAL COMPARTMENT UNI COMPARMENTAL ARTHROPLASTY (Right) Past Medical History  Diagnosis Date  . Impaired hearing     NO HEARING AIDS  . Anxiety   . Glaucoma     BILATERAL  . Cataract     LEFT EYE  . Hypertension   . GERD (gastroesophageal reflux disease)   . Arthritis    Principal Problem:   OA  (osteoarthritis) of knee  Estimated body mass index is 27.39 kg/(m^2) as calculated from the following:   Height as of this encounter: 6' (1.829 m).   Weight as of this encounter: 91.627 kg (202 lb). Up with therapy Discharge home with home health Diet - Cardiac diet Follow up - in 2 weeks Activity - WBAT Disposition - Home Condition Upon Discharge - Good D/C Meds - See DC Summary DVT Prophylaxis - Xarelto  Roger Cameron 01/04/2013, 8:49 AM

## 2013-01-04 NOTE — Care Management Note (Signed)
    Page 1 of 1   01/04/2013     11:21:00 AM   CARE MANAGEMENT NOTE 01/04/2013  Patient:  Roger Cameron, Roger Cameron   Account Number:  [id number]  Date Initiated:  01/04/2013  Documentation initiated by:  Colleen Can  Subjective/Objective Assessment:   Dx Rt medial knee arthroplasty     Action/Plan:   CM spoke with caregiver. daughter states patient will retunr to his home in [place] where she will be caregiver. He aqlready has RW and commode seat. Laurell Josephs will be providing Hpt services.   Anticipated DC Date:  01/04/2013   Anticipated DC Plan:  HOME W HOME HEALTH SERVICES      DC Planning Services  CM consult      [facility] Choice  HOME HEALTH   Choice offered to / List presented to:  C-4 Adult Children        HH arranged  HH-2 PT      [facility] agency  [facility]   Status of service:  Completed, signed off Medicare Important Message given?  NA - LOS <3 / Initial given by admissions (If response is "NO", the following Medicare IM given date fields will be blank) Date Medicare IM given:   Date Additional Medicare IM given:    Discharge Disposition:  HOME W HOME HEALTH SERVICES  Per UR Regulation:    If discussed at Long Length of Stay Meetings, dates discussed:    Comments:  01/04/2013 Colleen Can BSN RN CCM [phone] Genevieve Norlander wiil provide HHpt services with start date of tomorrow.

## 2013-01-05 DIAGNOSIS — Z471 Aftercare following joint replacement surgery: Secondary | ICD-10-CM | POA: Diagnosis not present

## 2013-01-05 DIAGNOSIS — M1710 Unilateral primary osteoarthritis, unspecified knee: Secondary | ICD-10-CM | POA: Diagnosis not present

## 2013-01-05 DIAGNOSIS — IMO0001 Reserved for inherently not codable concepts without codable children: Secondary | ICD-10-CM | POA: Diagnosis not present

## 2013-01-05 DIAGNOSIS — Z96659 Presence of unspecified artificial knee joint: Secondary | ICD-10-CM | POA: Diagnosis not present

## 2013-01-05 DIAGNOSIS — F411 Generalized anxiety disorder: Secondary | ICD-10-CM | POA: Diagnosis not present

## 2013-01-05 DIAGNOSIS — I10 Essential (primary) hypertension: Secondary | ICD-10-CM | POA: Diagnosis not present

## 2013-01-13 DIAGNOSIS — Z471 Aftercare following joint replacement surgery: Secondary | ICD-10-CM | POA: Diagnosis not present

## 2013-01-13 DIAGNOSIS — Z96659 Presence of unspecified artificial knee joint: Secondary | ICD-10-CM | POA: Diagnosis not present

## 2013-01-18 DIAGNOSIS — Z96659 Presence of unspecified artificial knee joint: Secondary | ICD-10-CM | POA: Diagnosis not present

## 2013-01-19 DIAGNOSIS — Z96659 Presence of unspecified artificial knee joint: Secondary | ICD-10-CM | POA: Diagnosis not present

## 2013-01-19 DIAGNOSIS — Z471 Aftercare following joint replacement surgery: Secondary | ICD-10-CM | POA: Diagnosis not present

## 2013-01-24 DIAGNOSIS — D649 Anemia, unspecified: Secondary | ICD-10-CM | POA: Diagnosis not present

## 2013-01-27 DIAGNOSIS — Z471 Aftercare following joint replacement surgery: Secondary | ICD-10-CM | POA: Diagnosis not present

## 2013-02-01 ENCOUNTER — Encounter (HOSPITAL_COMMUNITY): Payer: Self-pay | Admitting: Pharmacy Technician

## 2013-02-01 ENCOUNTER — Other Ambulatory Visit: Payer: Self-pay | Admitting: Orthopedic Surgery

## 2013-02-01 ENCOUNTER — Encounter (HOSPITAL_COMMUNITY): Payer: Self-pay | Admitting: *Deleted

## 2013-02-01 DIAGNOSIS — Z471 Aftercare following joint replacement surgery: Secondary | ICD-10-CM | POA: Diagnosis not present

## 2013-02-01 DIAGNOSIS — L089 Local infection of the skin and subcutaneous tissue, unspecified: Secondary | ICD-10-CM | POA: Diagnosis present

## 2013-02-01 MED ORDER — DEXAMETHASONE SODIUM PHOSPHATE 10 MG/ML IJ SOLN: 10.0000 mg | Freq: Once | INTRAMUSCULAR | Status: DC

## 2013-02-01 NOTE — Progress Notes (Signed)
PT ADDED ON FOR SURGERY TOMORROW 7/16.  PREOP INSTRUCTIONS INCLUDING CHLORHEXIDINE BATHS / PRECAUTIONS REVIEWED WITH PT BY PHONE.

## 2013-02-01 NOTE — H&P (Signed)
Roger Cameron is an 79 y.o. male.   Chief Complaint: Right knee pain HPI: 79 yo male s/p right knee unicompartmental replacement on 6/16 who developed post-op hematoma and has had persistent drainage which has now ceased, but was noted to have increased swelling when seen in office on 7/15. Has not had fever, chills or night sweats but had increased warmth at top of incision. Joint aspiration revealed no fluid but subcutaneous aspiration revealed small amount of seropurulent fluid. Presents to OR for irrigation and debridement of this soft tissue infection.  Past Medical History  Diagnosis Date  . Impaired hearing     NO HEARING AIDS  . Anxiety   . Glaucoma     BILATERAL  . Cataract     LEFT EYE  . Hypertension   . GERD (gastroesophageal reflux disease)   . Arthritis   . Infection 02/01/13    RIGHT KNEE  S/P RT UNIKNEE REPLACEMENT ON 01/03/13 - IS TO HAVE SURGERY 7/16 I&D    Past Surgical History  Procedure Laterality Date  . Right knee arthroscopy  x 2    . Hernia repair      RIGHT INGUINAL HERNAI REPAIR  . Tonsillectomy      AS A CHILD  . Eye surgery      RIGHT EYE CATARACT REMOVED  . Partial knee arthroplasty Right 01/03/2013    Procedure: RIGHT KNEE MEDIAL COMPARTMENT UNI COMPARMENTAL ARTHROPLASTY;  Surgeon: Loanne Drilling, MD;  Location: [facility] ORS;  Service: Orthopedics;  Laterality: Right;    History reviewed. No pertinent family history. Social History:  reports that he has never smoked. He has never used smokeless tobacco. He reports that  drinks alcohol. He reports that he does not use illicit drugs.  Allergies:  Allergies  Allergen Reactions  . Adhesive (Tape) Rash    blisters    No prescriptions prior to admission    No results found for this or any previous visit (from the past 48 hour(s)). No results found.  Review of Systems  Constitutional: Negative.   HENT: Negative.   Eyes: Negative.   Respiratory: Negative.   Cardiovascular: Negative.    Gastrointestinal: Negative.   Genitourinary: Negative.   Musculoskeletal: Positive for joint pain.  Skin: Negative.   Neurological: Negative.   Endo/Heme/Allergies: Negative.   Psychiatric/Behavioral: Negative.     There were no vitals taken for this visit. Physical Exam  Physical Examination: General appearance - alert, well appearing, ane that tid in no distress Mental status - alert, oriented to person, place, and time Chest - clear to auscultation, no wheezes, rales or rhonchi, symmetric air entry Heart - normal rate, regular rhythm, normal S1, S2, no murmurs, rubs, clicks or gallops Abdomen - soft, nontender, nondistended, no masses or organomegaly Right knee with moderate swelling but no palpable intraarticular effusion; warmth at superior aspect of incision; ROM 5-95; No laxity   Assessment/Plan Soft tissue infection right knee- Plan operative irrigation and debridement. Does not appear to affect joint but will explore to insure that it does not. Discussed in detail with patient who elects to proceed  Loanne Drilling 02/01/2013, 9:22 PM

## 2013-02-02 ENCOUNTER — Ambulatory Visit (HOSPITAL_COMMUNITY): Payer: Medicare Other | Admitting: Anesthesiology

## 2013-02-02 ENCOUNTER — Encounter (HOSPITAL_COMMUNITY)
Admission: RE | Disposition: A | Discharge status: Home Health | Payer: Self-pay | Source: Ambulatory Visit | Attending: Orthopedic Surgery

## 2013-02-02 ENCOUNTER — Encounter (HOSPITAL_COMMUNITY): Payer: Self-pay | Admitting: *Deleted

## 2013-02-02 ENCOUNTER — Inpatient Hospital Stay (HOSPITAL_COMMUNITY)
Admission: RE | Admit: 2013-02-02 | Discharge: 2013-02-04 | DRG: 902 | Disposition: A | Discharge status: Home Health | Payer: Medicare Other | Source: Ambulatory Visit | Attending: Orthopedic Surgery | Admitting: Orthopedic Surgery

## 2013-02-02 ENCOUNTER — Encounter (HOSPITAL_COMMUNITY): Payer: Self-pay | Admitting: Anesthesiology

## 2013-02-02 DIAGNOSIS — IMO0002 Reserved for concepts with insufficient information to code with codable children: Principal | ICD-10-CM | POA: Diagnosis present

## 2013-02-02 DIAGNOSIS — H409 Unspecified glaucoma: Secondary | ICD-10-CM | POA: Diagnosis present

## 2013-02-02 DIAGNOSIS — K219 Gastro-esophageal reflux disease without esophagitis: Secondary | ICD-10-CM | POA: Diagnosis present

## 2013-02-02 DIAGNOSIS — D649 Anemia, unspecified: Secondary | ICD-10-CM

## 2013-02-02 DIAGNOSIS — T8140XA Infection following a procedure, unspecified, initial encounter: Secondary | ICD-10-CM | POA: Diagnosis not present

## 2013-02-02 DIAGNOSIS — E871 Hypo-osmolality and hyponatremia: Secondary | ICD-10-CM

## 2013-02-02 DIAGNOSIS — M1710 Unilateral primary osteoarthritis, unspecified knee: Secondary | ICD-10-CM

## 2013-02-02 DIAGNOSIS — M7981 Nontraumatic hematoma of soft tissue: Secondary | ICD-10-CM | POA: Diagnosis not present

## 2013-02-02 DIAGNOSIS — I10 Essential (primary) hypertension: Secondary | ICD-10-CM | POA: Diagnosis not present

## 2013-02-02 DIAGNOSIS — F411 Generalized anxiety disorder: Secondary | ICD-10-CM | POA: Diagnosis present

## 2013-02-02 DIAGNOSIS — L089 Local infection of the skin and subcutaneous tissue, unspecified: Secondary | ICD-10-CM | POA: Diagnosis not present

## 2013-02-02 DIAGNOSIS — Z96659 Presence of unspecified artificial knee joint: Secondary | ICD-10-CM

## 2013-02-02 DIAGNOSIS — Y831 Surgical operation with implant of artificial internal device as the cause of abnormal reaction of the patient, or of later complication, without mention of misadventure at the time of the procedure: Secondary | ICD-10-CM | POA: Diagnosis present

## 2013-02-02 HISTORY — DX: Unspecified infectious disease: B99.9

## 2013-02-02 HISTORY — PX: IRRIGATION AND DEBRIDEMENT KNEE: SHX5185

## 2013-02-02 LAB — CBC
HCT: 28.8 % — ABNORMAL LOW (ref 39.0–52.0)
Hemoglobin: 9.8 g/dL — ABNORMAL LOW (ref 13.0–17.0)
MCV: 88.6 fL (ref 78.0–100.0)
Platelets: 486 10*3/uL — ABNORMAL HIGH (ref 150–400)
RBC: 3.25 MIL/uL — ABNORMAL LOW (ref 4.22–5.81)
WBC: 8.9 10*3/uL (ref 4.0–10.5)

## 2013-02-02 LAB — BASIC METABOLIC PANEL
CO2: 25 mEq/L (ref 19–32)
Chloride: 99 mEq/L (ref 96–112)
Creatinine, Ser: 0.91 mg/dL (ref 0.50–1.35)
Glucose, Bld: 99 mg/dL (ref 70–99)

## 2013-02-02 LAB — PROTIME-INR: Prothrombin Time: 15 seconds (ref 11.6–15.2)

## 2013-02-02 SURGERY — IRRIGATION AND DEBRIDEMENT KNEE
Anesthesia: General | Site: Knee | Laterality: Right | Wound class: Dirty or Infected

## 2013-02-02 MED ORDER — METOCLOPRAMIDE HCL 10 MG PO TABS: 5.0000 mg | ORAL_TABLET | Freq: Three times a day (TID) | ORAL | Status: DC | PRN

## 2013-02-02 MED ORDER — ACETAMINOPHEN 10 MG/ML IV SOLN
1000.0000 mg | Freq: Once | INTRAVENOUS | Status: AC
  Administered 2013-02-02: 1000 mg via INTRAVENOUS
  Filled 2013-02-02: qty 100

## 2013-02-02 MED ORDER — PROMETHAZINE HCL 25 MG/ML IJ SOLN: 6.2500 mg | INTRAMUSCULAR | Status: DC | PRN

## 2013-02-02 MED ORDER — PROPOFOL 10 MG/ML IV BOLUS
INTRAVENOUS | Status: DC | PRN
  Administered 2013-02-02: 160 mg via INTRAVENOUS

## 2013-02-02 MED ORDER — FENTANYL CITRATE 0.05 MG/ML IJ SOLN
INTRAMUSCULAR | Status: AC
  Filled 2013-02-02: qty 2

## 2013-02-02 MED ORDER — CEFAZOLIN SODIUM-DEXTROSE 2-3 GM-% IV SOLR
INTRAVENOUS | Status: AC
  Filled 2013-02-02: qty 50

## 2013-02-02 MED ORDER — ONDANSETRON HCL 4 MG/2ML IJ SOLN: 4.0000 mg | Freq: Four times a day (QID) | INTRAMUSCULAR | Status: DC | PRN

## 2013-02-02 MED ORDER — METHOCARBAMOL 500 MG PO TABS
ORAL_TABLET | ORAL | Status: AC
  Filled 2013-02-02: qty 1

## 2013-02-02 MED ORDER — MEPERIDINE HCL 50 MG/ML IJ SOLN: 6.2500 mg | INTRAMUSCULAR | Status: DC | PRN

## 2013-02-02 MED ORDER — SODIUM CHLORIDE 0.9 % IR SOLN
Status: DC | PRN
  Administered 2013-02-02: 6000 mL

## 2013-02-02 MED ORDER — SODIUM CHLORIDE 0.9 % IV SOLN
INTRAVENOUS | Status: DC
  Administered 2013-02-02: 22:00:00 via INTRAVENOUS

## 2013-02-02 MED ORDER — CHLORHEXIDINE GLUCONATE 4 % EX LIQD
60.0000 mL | Freq: Once | CUTANEOUS | Status: DC
  Filled 2013-02-02: qty 60

## 2013-02-02 MED ORDER — 0.9 % SODIUM CHLORIDE (POUR BTL) OPTIME
TOPICAL | Status: DC | PRN
  Administered 2013-02-02: 1000 mL

## 2013-02-02 MED ORDER — LACTATED RINGERS IV SOLN
INTRAVENOUS | Status: DC
  Administered 2013-02-02: 1000 mL via INTRAVENOUS
  Administered 2013-02-02: 19:00:00 via INTRAVENOUS

## 2013-02-02 MED ORDER — CEFAZOLIN SODIUM-DEXTROSE 2-3 GM-% IV SOLR: 2.0000 g | Freq: Four times a day (QID) | INTRAVENOUS | Status: DC

## 2013-02-02 MED ORDER — ATENOLOL 25 MG PO TABS
25.0000 mg | ORAL_TABLET | Freq: Every morning | ORAL | Status: DC
  Administered 2013-02-03 – 2013-02-04 (×2): 25 mg via ORAL
  Filled 2013-02-02 (×2): qty 1

## 2013-02-02 MED ORDER — ACETAMINOPHEN 10 MG/ML IV SOLN
1000.0000 mg | INTRAVENOUS | Status: DC
  Filled 2013-02-02: qty 100

## 2013-02-02 MED ORDER — SODIUM CHLORIDE 0.9 % IV SOLN: INTRAVENOUS | Status: DC

## 2013-02-02 MED ORDER — OXYCODONE-ACETAMINOPHEN 5-325 MG PO TABS
1.0000 | ORAL_TABLET | ORAL | Status: DC | PRN
  Administered 2013-02-03 – 2013-02-04 (×4): 2 via ORAL
  Filled 2013-02-02 (×4): qty 2

## 2013-02-02 MED ORDER — ONDANSETRON HCL 4 MG PO TABS: 4.0000 mg | ORAL_TABLET | Freq: Four times a day (QID) | ORAL | Status: DC | PRN

## 2013-02-02 MED ORDER — MORPHINE SULFATE 2 MG/ML IJ SOLN
1.0000 mg | INTRAMUSCULAR | Status: DC | PRN
  Filled 2013-02-02: qty 1

## 2013-02-02 MED ORDER — DEXTROSE 5 % IV SOLN
3.0000 g | INTRAVENOUS | Status: AC
  Administered 2013-02-02: 2 g via INTRAVENOUS
  Filled 2013-02-02: qty 3000

## 2013-02-02 MED ORDER — PANTOPRAZOLE SODIUM 40 MG PO TBEC
40.0000 mg | DELAYED_RELEASE_TABLET | Freq: Every day | ORAL | Status: DC
  Administered 2013-02-03 – 2013-02-04 (×2): 40 mg via ORAL
  Filled 2013-02-02 (×3): qty 1

## 2013-02-02 MED ORDER — TRAMADOL HCL 50 MG PO TABS: 50.0000 mg | ORAL_TABLET | Freq: Four times a day (QID) | ORAL | Status: DC | PRN

## 2013-02-02 MED ORDER — ALPRAZOLAM 0.5 MG PO TABS: 0.5000 mg | ORAL_TABLET | Freq: Every day | ORAL | Status: DC | PRN

## 2013-02-02 MED ORDER — METOCLOPRAMIDE HCL 5 MG/ML IJ SOLN: 5.0000 mg | Freq: Three times a day (TID) | INTRAMUSCULAR | Status: DC | PRN

## 2013-02-02 MED ORDER — IRBESARTAN 300 MG PO TABS
300.0000 mg | ORAL_TABLET | Freq: Every day | ORAL | Status: DC
  Administered 2013-02-03 – 2013-02-04 (×2): 300 mg via ORAL
  Filled 2013-02-02 (×2): qty 1

## 2013-02-02 MED ORDER — ONDANSETRON HCL 4 MG/2ML IJ SOLN
INTRAMUSCULAR | Status: DC | PRN
  Administered 2013-02-02: 4 mg via INTRAVENOUS

## 2013-02-02 MED ORDER — FENTANYL CITRATE 0.05 MG/ML IJ SOLN
25.0000 ug | INTRAMUSCULAR | Status: DC | PRN
  Administered 2013-02-02 (×3): 50 ug via INTRAVENOUS

## 2013-02-02 MED ORDER — FENTANYL CITRATE 0.05 MG/ML IJ SOLN
INTRAMUSCULAR | Status: DC | PRN
  Administered 2013-02-02 (×8): 25 ug via INTRAVENOUS

## 2013-02-02 MED ORDER — HYDROCHLOROTHIAZIDE 25 MG PO TABS
25.0000 mg | ORAL_TABLET | Freq: Every morning | ORAL | Status: DC
  Administered 2013-02-03 – 2013-02-04 (×2): 25 mg via ORAL
  Filled 2013-02-02 (×2): qty 1

## 2013-02-02 MED ORDER — METHOCARBAMOL 500 MG PO TABS
500.0000 mg | ORAL_TABLET | Freq: Four times a day (QID) | ORAL | Status: DC | PRN
  Administered 2013-02-02 – 2013-02-04 (×3): 500 mg via ORAL
  Filled 2013-02-02 (×3): qty 1

## 2013-02-02 MED ORDER — LATANOPROST 0.005 % OP SOLN
1.0000 [drp] | Freq: Every day | OPHTHALMIC | Status: DC
  Administered 2013-02-02 – 2013-02-04 (×2): 1 [drp] via OPHTHALMIC
  Filled 2013-02-02: qty 2.5

## 2013-02-02 SURGICAL SUPPLY — 53 items
BAG SPEC THK2 15X12 ZIP CLS (MISCELLANEOUS) ×1
BAG ZIPLOCK 12X15 (MISCELLANEOUS) ×2 IMPLANT
BANDAGE ELASTIC 6 VELCRO ST LF (GAUZE/BANDAGES/DRESSINGS) ×2 IMPLANT
BANDAGE ESMARK 6X9 LF (GAUZE/BANDAGES/DRESSINGS) ×1 IMPLANT
BNDG CMPR 9X6 STRL LF SNTH (GAUZE/BANDAGES/DRESSINGS)
BNDG ESMARK 6X9 LF (GAUZE/BANDAGES/DRESSINGS)
CLOTH BEACON ORANGE TIMEOUT ST (SAFETY) ×2 IMPLANT
CONT SPECI 4OZ STER CLIK (MISCELLANEOUS) ×1 IMPLANT
CUFF TOURN SGL QUICK 34 (TOURNIQUET CUFF) ×2
CUFF TRNQT CYL 34X4X40X1 (TOURNIQUET CUFF) ×1 IMPLANT
DRAPE EXTREMITY T 121X128X90 (DRAPE) ×2 IMPLANT
DRSG ADAPTIC 3X8 NADH LF (GAUZE/BANDAGES/DRESSINGS) ×2 IMPLANT
DRSG EMULSION OIL 3X16 NADH (GAUZE/BANDAGES/DRESSINGS) ×1 IMPLANT
DRSG PAD ABDOMINAL 8X10 ST (GAUZE/BANDAGES/DRESSINGS) ×2 IMPLANT
DRSG TEGADERM 2-3/8X2-3/4 SM (GAUZE/BANDAGES/DRESSINGS) ×2 IMPLANT
DURAPREP 26ML APPLICATOR (WOUND CARE) ×2 IMPLANT
ELECT REM PT RETURN 9FT ADLT (ELECTROSURGICAL) ×2
ELECTRODE REM PT RTRN 9FT ADLT (ELECTROSURGICAL) ×1 IMPLANT
EVACUATOR 1/8 PVC DRAIN (DRAIN) ×2 IMPLANT
GLOVE BIO SURGEON STRL SZ8 (GLOVE) ×1 IMPLANT
GLOVE BIOGEL PI IND STRL 7.5 (GLOVE) ×1 IMPLANT
GLOVE BIOGEL PI IND STRL 8 (GLOVE) ×1 IMPLANT
GLOVE BIOGEL PI INDICATOR 7.5 (GLOVE)
GLOVE BIOGEL PI INDICATOR 8 (GLOVE) ×1
GLOVE ECLIPSE 8.0 STRL XLNG CF (GLOVE) IMPLANT
GLOVE ORTHO TXT STRL SZ7.5 (GLOVE) ×2 IMPLANT
GLOVE SURG ORTHO 8.0 STRL STRW (GLOVE) ×1 IMPLANT
GLOVE SURG SS PI 8.5 STRL IVOR (GLOVE) ×1
GLOVE SURG SS PI 8.5 STRL STRW (GLOVE) IMPLANT
GOWN BRE IMP PREV XXLGXLNG (GOWN DISPOSABLE) ×2 IMPLANT
GOWN STRL NON-REIN LRG LVL3 (GOWN DISPOSABLE) ×2 IMPLANT
GOWN STRL REIN 2XL XLG LVL4 (GOWN DISPOSABLE) ×1 IMPLANT
HANDPIECE INTERPULSE COAX TIP (DISPOSABLE) ×2
IMMOBILIZER KNEE 20 (SOFTGOODS) ×4
IMMOBILIZER KNEE 20 THIGH 36 (SOFTGOODS) IMPLANT
IV NS IRRIG 3000ML ARTHROMATIC (IV SOLUTION) ×2 IMPLANT
KIT BASIN OR (CUSTOM PROCEDURE TRAY) ×2 IMPLANT
MANIFOLD NEPTUNE II (INSTRUMENTS) ×2 IMPLANT
NS IRRIG 1000ML POUR BTL (IV SOLUTION) ×1 IMPLANT
PACK TOTAL JOINT (CUSTOM PROCEDURE TRAY) ×2 IMPLANT
PADDING CAST COTTON 6X4 STRL (CAST SUPPLIES) ×4 IMPLANT
POSITIONER SURGICAL ARM (MISCELLANEOUS) ×2 IMPLANT
SET HNDPC FAN SPRY TIP SCT (DISPOSABLE) ×1 IMPLANT
SPONGE GAUZE 4X4 12PLY (GAUZE/BANDAGES/DRESSINGS) ×2 IMPLANT
STRIP CLOSURE SKIN 1/2X4 (GAUZE/BANDAGES/DRESSINGS) ×2 IMPLANT
SUT MNCRL AB 4-0 PS2 18 (SUTURE) ×1 IMPLANT
SUT PDS AB 1 CT1 27 (SUTURE) ×2 IMPLANT
SUT VIC AB 2-0 CT1 27 (SUTURE) ×6
SUT VIC AB 2-0 CT1 TAPERPNT 27 (SUTURE) IMPLANT
SWAB COLLECTION DEVICE MRSA (MISCELLANEOUS) ×2 IMPLANT
TOWEL OR 17X26 10 PK STRL BLUE (TOWEL DISPOSABLE) ×4 IMPLANT
TUBE ANAEROBIC SPECIMEN COL (MISCELLANEOUS) ×2 IMPLANT
WRAP KNEE MAXI GEL POST OP (GAUZE/BANDAGES/DRESSINGS) ×1 IMPLANT

## 2013-02-02 NOTE — Preoperative (Signed)
Beta Blockers   Reason not to administer Beta Blockers:Not Applicable 

## 2013-02-02 NOTE — Brief Op Note (Addendum)
02/02/2013  8:13 PM  PATIENT:  Lum Keas  79 y.o. male  PRE-OPERATIVE DIAGNOSIS:  SOFT TISSUE INFECTION OF RIGHT KNEE  POST-OPERATIVE DIAGNOSIS:  infected right knee  PROCEDURE:  Procedure(s): IRRIGATION AND DEBRIDEMENT KNEE (Right)  SURGEON:  Surgeon(s) and Role:    * Loanne Drilling, MD - Primary  PHYSICIAN ASSISTANT:   ASSISTANTS: none   ANESTHESIA:   general  EBL:  Total I/O In: 1100 [I.V.:1100] Out: -   BLOOD ADMINISTERED:none  DRAINS: (Medium) Hemovact drain(s) in the right knee with  Suction Open   LOCAL MEDICATIONS USED:  NONE  SPECIMEN:  No Specimen  DISPOSITION OF SPECIMEN:  N/A  COUNTS:  YES  TOURNIQUET:   Total Tourniquet Time Documented: Thigh (Right) - 44 minutes Total: Thigh (Right) - 44 minutes   DICTATION: .Other Dictation: Dictation Number [id number]  PLAN OF CARE: Admit for overnight observation  PATIENT DISPOSITION:  PACU - hemodynamically stable.    Please not that this was both an excisional and non-excisional debridement. It was excisional in that I excised necrotic skin edges with a knife but otherwise non-excisional in that the remainder of the debridement was performed by other means.

## 2013-02-02 NOTE — Anesthesia Postprocedure Evaluation (Signed)
  Anesthesia Post-op Note  Patient: Roger Cameron  Procedure(s) Performed: Procedure(s): IRRIGATION AND DEBRIDEMENT KNEE (Right) Patient is awake and responsive. Pain and nausea are reasonably well controlled. Vital signs are stable and clinically acceptable. Oxygen saturation is clinically acceptable. There are no apparent anesthetic complications at this time. Patient is ready for discharge.

## 2013-02-02 NOTE — Transfer of Care (Signed)
Immediate Anesthesia Transfer of Care Note  Patient: Roger Cameron  Procedure(s) Performed: Procedure(s): IRRIGATION AND DEBRIDEMENT KNEE (Right)  Patient Location: PACU  Anesthesia Type:General  Level of Consciousness: awake, alert , oriented and patient cooperative  Airway & Oxygen Therapy: Patient Spontanous Breathing and Patient connected to face mask oxygen  Post-op Assessment: Report given to PACU RN and Post -op Vital signs reviewed and stable  Post vital signs: Reviewed and stable  Complications: No apparent anesthesia complications

## 2013-02-02 NOTE — Interval H&P Note (Signed)
History and Physical Interval Note:  02/02/2013 6:09 PM  Roger Cameron  has presented today for surgery, with the diagnosis of SOFT TISSUE INFECTION OF RIGHT KNEE  The various methods of treatment have been discussed with the patient and family. After consideration of risks, benefits and other options for treatment, the patient has consented to  Procedure(s): IRRIGATION AND DEBRIDEMENT KNEE (Right) as a surgical intervention .  The patient's history has been reviewed, patient examined, no change in status, stable for surgery.  I have reviewed the patient's chart and labs.  Questions were answered to the patient's satisfaction.     Loanne Drilling

## 2013-02-02 NOTE — Anesthesia Preprocedure Evaluation (Addendum)
Anesthesia Evaluation  Patient identified by MRN, date of birth, ID band Patient awake    Reviewed: Allergy & Precautions, H&P , NPO status , Patient's Chart, lab work & pertinent test results, reviewed documented beta blocker date and time   Airway Mallampati: II TM Distance: >3 FB Neck ROM: Full    Dental no notable dental hx. (+) Dental Advisory Given   Pulmonary neg pulmonary ROS,  breath sounds clear to auscultation  Pulmonary exam normal       Cardiovascular hypertension, Pt. on medications and Pt. on home beta blockers Rhythm:Regular Rate:Normal     Neuro/Psych PSYCHIATRIC DISORDERS Anxiety negative neurological ROS     GI/Hepatic Neg liver ROS, GERD-  Medicated and Controlled,  Endo/Other  negative endocrine ROS  Renal/GU negative Renal ROS     Musculoskeletal negative musculoskeletal ROS (+)   Abdominal   Peds  Hematology negative hematology ROS (+)   Anesthesia Other Findings   Reproductive/Obstetrics                          Anesthesia Physical  Anesthesia Plan  ASA: II  Anesthesia Plan: General   Post-op Pain Management:    Induction:   Airway Management Planned: LMA  Additional Equipment:   Intra-op Plan:   Post-operative Plan:   Informed Consent: I have reviewed the patients History and Physical, chart, labs and discussed the procedure including the risks, benefits and alternatives for the proposed anesthesia with the patient or authorized representative who has indicated his/her understanding and acceptance.   Dental advisory given  Plan Discussed with: CRNA  Anesthesia Plan Comments:        Anesthesia Quick Evaluation

## 2013-02-03 ENCOUNTER — Encounter (HOSPITAL_COMMUNITY): Payer: Self-pay | Admitting: Orthopedic Surgery

## 2013-02-03 LAB — CBC
HCT: 25.3 % — ABNORMAL LOW (ref 39.0–52.0)
Hemoglobin: 8.7 g/dL — ABNORMAL LOW (ref 13.0–17.0)
MCH: 30.5 pg (ref 26.0–34.0)
MCHC: 34.4 g/dL (ref 30.0–36.0)

## 2013-02-03 MED ORDER — CEFAZOLIN SODIUM-DEXTROSE 2-3 GM-% IV SOLR
2.0000 g | Freq: Four times a day (QID) | INTRAVENOUS | Status: DC
  Administered 2013-02-03 – 2013-02-04 (×5): 2 g via INTRAVENOUS
  Filled 2013-02-03 (×8): qty 50

## 2013-02-03 MED ORDER — CEFAZOLIN SODIUM-DEXTROSE 2-3 GM-% IV SOLR
2.0000 g | Freq: Four times a day (QID) | INTRAVENOUS | Status: DC
  Administered 2013-02-03: 2 g via INTRAVENOUS
  Filled 2013-02-03 (×2): qty 50

## 2013-02-03 NOTE — Evaluation (Signed)
Physical Therapy Evaluation Patient Details Name: Roger Cameron MRN: [id number] DOB: 04-Nov-1928 Today's Date: 02/03/2013 Time: 7829-5621 PT Time Calculation (min): 23 min  PT Assessment / Plan / Recommendation History of Present Illness  79 yo male s/p right knee unicompartmental replacement on 6/16 who developed post-op hematoma and has had persistent drainage which has now ceased, but was noted to have increased swelling when seen in office on 7/15. Has not had fever, chills or night sweats but had increased warmth at top of incision. Joint aspiration revealed no fluid but subcutaneous aspiration revealed small amount of seropurulent fluid. Presents to OR for irrigation and debridement of this soft tissue infection on7/16/14. pt plans to DC to daughter's home in [place].  Clinical Impression  Pt tolerated ambulation. KI is not wpecified for wearing but assume to have on at all times with no ROM. Pt plans to go to daughter's home with 24/7 caregivers.    PT Assessment  Patient needs continued PT services    Follow Up Recommendations  Home health PT    Does the patient have the potential to tolerate intense rehabilitation      Barriers to Discharge        Equipment Recommendations  None recommended by PT    Recommendations for Other Services     Frequency 7X/week    Precautions / Restrictions Precautions Precautions: Knee Required Braces or Orthoses: Knee Immobilizer - Right Knee Immobilizer - Right: On at all times (not ordered but presume on at all times.)   Pertinent Vitals/Pain 6/ R knee.      Mobility  Bed Mobility Bed Mobility: Supine to Sit Supine to Sit: 4: Min assist;HOB elevated;With rails Transfers Transfers: Sit to Stand;Stand to Sit Sit to Stand: 4: Min assist;From bed Stand to Sit: To chair/3-in-1;With upper extremity assist;4: Min guard Details for Transfer Assistance: cues for hand placement and R leg. Ambulation/Gait Ambulation/Gait Assistance: 4:  Min assist Ambulation Distance (Feet): 150 Feet Assistive device: Rolling walker Ambulation/Gait Assistance Details: pt is shakey, improved as pt ambulated farther. Gait Pattern: Step-to pattern;Antalgic;Decreased stride length;Decreased step length - right    Exercises     PT Diagnosis: Difficulty walking;Acute pain  PT Problem List: Decreased strength;Decreased activity tolerance;Decreased balance;Decreased mobility;Decreased knowledge of use of DME;Decreased safety awareness;Decreased knowledge of precautions;Pain PT Treatment Interventions: DME instruction;Gait training;Stair training;Functional mobility training;Therapeutic activities;Patient/family education     PT Goals(Current goals can be found in the care plan section) Acute Rehab PT Goals Patient Stated Goal: i want to get back to driving PT Goal Formulation: With patient/family Time For Goal Achievement: 02/10/13 Potential to Achieve Goals: Good  Visit Information  Last PT Received On: 02/03/13 Assistance Needed: +1 History of Present Illness: 79 yo male s/p right knee unicompartmental replacement on 6/16 who developed post-op hematoma and has had persistent drainage which has now ceased, but was noted to have increased swelling when seen in office on 7/15. Has not had fever, chills or night sweats but had increased warmth at top of incision. Joint aspiration revealed no fluid but subcutaneous aspiration revealed small amount of seropurulent fluid. Presents to OR for irrigation and debridement of this soft tissue infection.s/p Iand D 02/02/13. pt plans to DC to daughter's home in [place].       Prior Functioning  Home Living Family/patient expects to be discharged to:: Private residence Living Arrangements: Children Available Help at Discharge: Family Type of Home: House Home Layout: Multi-level Alternate Level Stairs-Number of Steps: 4 Alternate Level Stairs-Rails: Left Home  Equipment: Dan Humphreys - 2 wheels;Bedside  commode Prior Function Level of Independence: Independent with assistive device(s)    Cognition  Cognition Arousal/Alertness: Awake/alert Behavior During Therapy: WFL for tasks assessed/performed    Extremity/Trunk Assessment Upper Extremity Assessment Upper Extremity Assessment: Overall WFL for tasks assessed Lower Extremity Assessment Lower Extremity Assessment: RLE deficits/detail RLE Deficits / Details: required assistance to move leg to edge of bed.   Balance    End of Session PT - End of Session Equipment Utilized During Treatment: Gait belt Activity Tolerance: Patient tolerated treatment well Patient left: in chair;with call bell/phone within reach;with family/visitor present Nurse Communication: Mobility status  GP     [name] 02/03/2013, 1:24 PM

## 2013-02-03 NOTE — Progress Notes (Addendum)
Subjective: 1 Day Post-Op Procedure(s) (LRB): IRRIGATION AND DEBRIDEMENT KNEE (Right) Patient reports pain as mild.   Patient seen in rounds with Dr. Lequita Halt. Patient is well, but has had some minor complaints of pain in the knee, requiring pain medications We will start therapy today.  Plan is to go Home after hospital stay.  Objective: Vital signs in last 24 hours: Temp:  [97.7 F (36.5 C)-98.4 F (36.9 C)] 98.3 F (36.8 C) (07/17 0621) Pulse Rate:  [61-73] 72 (07/17 0621) Resp:  [14-20] 14 (07/17 0621) BP: (133-164)/(70-78) 152/72 mmHg (07/17 0621) SpO2:  [95 %-100 %] 95 % (07/17 0621) Weight:  [84.482 kg (186 lb 4 oz)] 84.482 kg (186 lb 4 oz) (07/16 1455)  Intake/Output from previous day:  Intake/Output Summary (Last 24 hours) at 02/03/13 0832 Last data filed at 02/03/13 0622  Gross per 24 hour  Intake 1596.67 ml  Output   1275 ml  Net 321.67 ml    Intake/Output this shift:    Labs:  Recent Labs  02/02/13 1520 02/03/13 0433  HGB 9.8* 8.7*    Recent Labs  02/02/13 1520 02/03/13 0433  WBC 8.9 7.6  RBC 3.25* 2.85*  HCT 28.8* 25.3*  PLT 486* 373    Recent Labs  02/02/13 1520  NA 136  K 3.6  CL 99  CO2 25  BUN 9  CREATININE 0.91  GLUCOSE 99  CALCIUM 9.9    Recent Labs  02/02/13 1520  INR 1.21    EXAM General - Patient is Alert, Appropriate and Oriented Extremity - Neurovascular intact Sensation intact distally Dorsiflexion/Plantar flexion intact Dressing - dressing C/D/I Motor Function - intact, moving foot and toes well on exam.    Past Medical History  Diagnosis Date  . Impaired hearing     NO HEARING AIDS  . Anxiety   . Glaucoma     BILATERAL  . Cataract     LEFT EYE  . Hypertension   . GERD (gastroesophageal reflux disease)   . Arthritis   . Infection 02/01/13    RIGHT KNEE  S/P RT UNIKNEE REPLACEMENT ON 01/03/13 - IS TO HAVE SURGERY 7/16 I&D    Assessment/Plan: 1 Day Post-Op Procedure(s) (LRB): IRRIGATION AND  DEBRIDEMENT KNEE (Right) Principal Problem:   Right knee skin infection  Estimated body mass index is 25.25 kg/(m^2) as calculated from the following:   Height as of this encounter: 6' (1.829 m).   Weight as of this encounter: 84.482 kg (186 lb 4 oz). Plan for discharge tomorrow Keep today to monitor wound, check cultures.  Possibly may need PICC Line. Cultures Pending Gram Stain - no orgs  DVT Prophylaxis - Aspirin Weight-Bearing as tolerated to right leg No vaccines. D/C O2 and Pulse OX and try on Room Air  Addendum - spoke with the patient's daughter who has concerned about her father.  He lives alone but has a neighbor that will check on him during the day.  She lives in [place] and would be okay with her father coming to stay with her but he would rather stay here in [place].  He wants to look into the possibility of going to a SNF if needed.  Will get social worker involved.  I told the daughter that a lot of his post hospital care will depend upon the cultures as the what or how many antibiotics and how often he may need to administer them.  She voiced her understanding.  He will stay here today and will reevaluate  the patient and labs tomorrow to help determine th best disposition and treatment plan for the patient.  PERKINS, ALEXZANDREW 02/03/2013, 8:32 AM

## 2013-02-03 NOTE — Op Note (Signed)
NAMETASHA, JINDRA NO.:  [id number]  MEDICAL RECORD NO.:  [id number]  LOCATION:  1614                         FACILITY:  [facility]  PHYSICIAN:  [name], M.D.    DATE OF BIRTH:  09/20/1928  DATE OF PROCEDURE:  02/02/2013 DATE OF DISCHARGE:                              OPERATIVE REPORT   PREOPERATIVE DIAGNOSIS:  Soft tissue infection right knee.  POSTOPERATIVE DIAGNOSIS:  Soft tissue infection right knee.  PROCEDURE:  Right knee irrigation and debridement.  SURGEON:  [name], MD  ASSISTANT:  No assistant.  ANESTHESIA:  General.  ESTIMATED BLOOD LOSS:  Minimal.  DRAIN:  Hemovac in the subcu and in the knee joint.  COMPLICATIONS:  None.  TOURNIQUET TIME:  43 minutes at 300 mmHg.  CONDITION:  Stable to recovery.  BRIEF CLINICAL NOTE:  Mr. Allen is an 79 year old male who underwent a right knee unicompartmental replacement approximately 3 weeks ago.  He had developed a hematoma postop from anticoagulation.  He had wound drainage and upon evaluation in the office yesterday I was concerned about the possibility of soft tissue infection.  He was taken to the operating room today for operative irrigation and debridement.  PROCEDURE IN DETAIL:  After successful administration of general anesthetic, a tourniquet was placed high on the right thigh and right lower extremity, prepped and draped in usual sterile fashion. Extremities elevated and tourniquet inflated to 300 mmHg.  A midline incision was made with a 10 blade in the subcutaneous tissue.  There was a small rent in the extensor mechanism.  There was a lot of fat necrosis present in the subcu.  No gross purulence was noted.  I opened the joint further and removed all the synovium.  Fortunately, the area overlying the prosthesis was very well sealed off and actually capsulated to the point where there was no communication down to that part of the joint. We did a thorough synovectomy in the  exposed area.  I then irrigated with 3 L of saline with pulsatile lavage and the joint looked normal at that point.  I then closed the arthrotomy over a Hemovac drain with interrupted #1 PDS suture.  I then debrided the subcu tissues down to normal-appearing tissue.  We irrigated again with about another 2 L of saline with pulsatile lavage.  I excised the eschar on the skin edge inferiorly.  I was then able to close the subcu tissue with interrupted 2-0 Vicryl over the Hemovac drain.  There was no tension on the suture line.  The subcuticular was then closed with running 4-0 Monocryl.  Incision was cleaned and dried.  Tourniquet released at a total time of 43 minutes.  There was no bleeding encountered.  A bulky sterile dressing was applied.  He was placed into a knee immobilizer, awakened and transported to recovery in stable condition.     [name], M.D.     FA/MEDQ  D:  02/02/2013  T:  02/03/2013  Job:  [id number]

## 2013-02-03 NOTE — Clinical Documentation Improvement (Signed)
THIS DOCUMENT IS NOT A PERMANENT PART OF THE MEDICAL RECORD  Please update your documentation with the medical record to reflect your response to this query. If you need help knowing how to do this please call [phone].  02/03/13   Dear Dr. Lequita Halt, [facility],  In a better effort to capture your patient's severity of illness, reflect appropriate length of stay and utilization of resources, a review of the patient medical record has revealed the following indicators.    Based on your clinical judgment, please clarify and document in a progress note and/or discharge summary the clinical condition associated with the following supporting information:  In responding to this query please exercise your independent judgment.  The fact that a query is asked, does not imply that any particular answer is desired or expected. Because that is documentation in the medical record of "debridement" clarification is needed.  Roger Cameron admitted for an Irrigation and debridement: It is noted that a 10 blade was used  to make an incision in the subcutaneous tissue and  Pulsatile lavage was used   Clarification Needed  Please clarify if procedure could be classified as Excisional debridement, Non-excisional debridement, or both (or other type of procedure) and document in pn or d/c summary   Please clarify the instrument used for the statements listed below  Op notes states the following: 1 .I then debrided the subcu tissues down to normal-appearing tissue. 2. I excised the eschar on the skin edge inferiorly.             Please document the below (4) key elements in a progress note:  1.  Type of Debridement:          Excisional Debridement-  Cutting away necrotic, devitalized tissue or slough to  Level of viable tissue using a sharp instrument (example, scalpel, scissors, etc).           NON Excisional Debridement-  The removal of necrotic, devitalized tissue or slough by means of  scraping, mechanical brushing, flushing, or washing. (example: irrigation, whirlpool);  Minor removal of loose fragments  2.  Please indicate instrument used:  Scissors, Scalpel, Curette, or other  3.  Please document depth of the debridement:             Partial Thickness  Full Thickness  Skin and Subcutaneous Tissue  Subcutaneous Tissue and Muscle  Subcutaneous Tissue, Muscle and Bone  Other  4.  Document the size of the debridement.  You may use possible, probable, or suspect with inpatient documentation. possible, probable, suspected diagnoses MUST be documented at the time of discharge  Reviewed: additional documentation in the medical record  Thank You,  Enis Slipper RN, BSN, MSN/Inf, CCDS Clinical Documentation Specialist [facility] Dept Pager: [phone] / E-mail: [email or web address]   [phone] Health Information Management [place]    Done. See addendum to the brief op note

## 2013-02-03 NOTE — Progress Notes (Signed)
Physical Therapy Treatment Patient Details Name: Roger Cameron MRN: [id number] DOB: 1928-07-23 Today's Date: 02/03/2013 Time: 1400-1410 PT Time Calculation (min): 10 min  PT Assessment / Plan / Recommendation  PT Comments   Pt sitting forward in recliner, had KI unsecured and was taking ice packs out. Daughter in room. PT asked if pt was starting to get confused anddaughter said no and that pt was just getting ancy. Encouraged pt to not  Remove KI. He acknowledged.  Follow Up Recommendations  Home health PT     Does the patient have the potential to tolerate intense rehabilitation     Barriers to Discharge        Equipment Recommendations  None recommended by PT    Recommendations for Other Services    Frequency 7X/week   Progress towards PT Goals Progress towards PT goals: Progressing toward goals  Plan Current plan remains appropriate    Precautions / Restrictions Precautions Precautions: Knee;Fall Precaution Comments: safety. Required Braces or Orthoses: Knee Immobilizer - Right Knee Immobilizer - Right: On at all times (until specified by MD OK to remove.)   Pertinent Vitals/Pain Pain is still 6    Mobility  Transfers Transfers: Sit to Stand;Stand to Sit Sit to Stand: 4: Min assist;From chair/3-in-1;With upper extremity assist Stand to Sit: With upper extremity assist;To chair/3-in-1;5: Supervision Details for Transfer Assistance: cues for hand placement and R leg.pt recalled to step RLE forward and reach back Ambulation/Gait Ambulation/Gait Assistance: 4: Min assist Ambulation Distance (Feet): 150 Feet Assistive device: Rolling walker Ambulation/Gait Assistance Details: pt not as shakey but remains a little teetery Gait Pattern: Step-to pattern;Antalgic;Decreased stride length;Decreased step length - right    Exercises     PT Diagnosis: Difficulty walking;Acute pain  PT Problem List: Decreased strength;Decreased activity tolerance;Decreased balance;Decreased  mobility;Decreased knowledge of use of DME;Decreased safety awareness;Decreased knowledge of precautions;Pain PT Treatment Interventions: DME instruction;Gait training;Stair training;Functional mobility training;Therapeutic activities;Patient/family education   PT Goals (current goals can now be found in the care plan section) Acute Rehab PT Goals Patient Stated Goal: I am bored. I want to walk more. PT Goal Formulation: With patient/family Time For Goal Achievement: 02/10/13 Potential to Achieve Goals: Good  Visit Information  Last PT Received On: 02/03/13 Assistance Needed: +1 History of Present Illness: 79 yo male s/p right knee unicompartmental replacement on 6/16 who developed post-op hematoma and has had persistent drainage which has now ceased, but was noted to have increased swelling when seen in office on 7/15. Has not had fever, chills or night sweats but had increased warmth at top of incision. Joint aspiration revealed no fluid but subcutaneous aspiration revealed small amount of seropurulent fluid. Presents to OR for irrigation and debridement of this soft tissue infection.s/p Iand D 02/02/13. pt plans to DC to daughter's home in [place].    Subjective Data  Patient Stated Goal: I am bored. I want to walk more.   Cognition  Cognition Arousal/Alertness: Awake/alert Behavior During Therapy: WFL for tasks assessed/performed    Balance  Balance Balance Assessed: Yes Static Standing Balance Static Standing - Balance Support: Bilateral upper extremity supported Static Standing - Level of Assistance: 5: Stand by assistance Static Standing - Comment/# of Minutes: w/RW  End of Session PT - End of Session Equipment Utilized During Treatment: Gait belt Activity Tolerance: Patient tolerated treatment well Patient left: in chair;with call bell/phone within reach;with family/visitor present Nurse Communication: Mobility status   GP     Roger Cameron 02/03/2013, 2:24  PM

## 2013-02-04 DIAGNOSIS — E871 Hypo-osmolality and hyponatremia: Secondary | ICD-10-CM

## 2013-02-04 DIAGNOSIS — D649 Anemia, unspecified: Secondary | ICD-10-CM

## 2013-02-04 LAB — BASIC METABOLIC PANEL
BUN: 7 mg/dL (ref 6–23)
GFR calc Af Amer: >90 mL/min (ref >90)
GFR calc non Af Amer: 78 mL/min — ABNORMAL LOW (ref >90)
Potassium: 3.8 mEq/L (ref 3.5–5.1)

## 2013-02-04 LAB — CBC
Hemoglobin: 9.1 g/dL — ABNORMAL LOW (ref 13.0–17.0)
MCHC: 34.5 g/dL (ref 30.0–36.0)
Platelets: 388 10*3/uL (ref 150–400)
RDW: 12.8 % (ref 11.5–15.5)

## 2013-02-04 MED ORDER — OXYCODONE HCL 5 MG PO CAPS: 5.0000 mg | ORAL_CAPSULE | Freq: Two times a day (BID) | ORAL | Status: DC

## 2013-02-04 MED ORDER — DOXYCYCLINE HYCLATE 100 MG PO TABS: 100.0000 mg | ORAL_TABLET | Freq: Two times a day (BID) | ORAL | Status: DC

## 2013-02-04 MED ORDER — TRAMADOL HCL 50 MG PO TABS: 50.0000 mg | ORAL_TABLET | Freq: Four times a day (QID) | ORAL | Status: DC | PRN

## 2013-02-04 MED ORDER — DOXYCYCLINE HYCLATE 100 MG PO TABS
100.0000 mg | ORAL_TABLET | Freq: Two times a day (BID) | ORAL | Status: DC
  Filled 2013-02-04 (×2): qty 1

## 2013-02-04 MED ORDER — METHOCARBAMOL 500 MG PO TABS: 500.0000 mg | ORAL_TABLET | Freq: Four times a day (QID) | ORAL | Status: DC | PRN

## 2013-02-04 MED ORDER — CEPHALEXIN 250 MG PO CAPS: 250.0000 mg | ORAL_CAPSULE | Freq: Three times a day (TID) | ORAL | Status: DC

## 2013-02-04 NOTE — Progress Notes (Signed)
Pt was active with [facility] and will continue with [facility] at discharge.

## 2013-02-04 NOTE — Progress Notes (Signed)
CSW consulted for SNF placement. CSW met with pt's daughter 7/17 to assist with d/c planning. Pt will be d/c to daughter's home following hospital d/c. Pt/family declining SNF at this time. RNCM has been notified and will assist with d/c planning needs.  Cori Razor LCSW [phone]

## 2013-02-04 NOTE — Progress Notes (Signed)
   Subjective: 2 Days Post-Op Procedure(s) (LRB): IRRIGATION AND DEBRIDEMENT KNEE (Right) Patient reports pain as mild.   Patient seen in rounds with Dr. Lequita Halt.  Doing better today. Patient is well, and has had no acute complaints or problems Patient is ready to go home today.  Objective: Vital signs in last 24 hours: Temp:  [97.7 F (36.5 C)-98.5 F (36.9 C)] 98.5 F (36.9 C) (07/18 4098) Pulse Rate:  [56-71] 56 (07/18 0633) Resp:  [14-16] 14 (07/18 0633) BP: (118-137)/(65-77) 128/74 mmHg (07/18 0633) SpO2:  [97 %-99 %] 99 % (07/18 0633)  Intake/Output from previous day:  Intake/Output Summary (Last 24 hours) at 02/04/13 0922 Last data filed at 02/04/13 0634  Gross per 24 hour  Intake 993.33 ml  Output   1980 ml  Net -986.67 ml    Intake/Output this shift:    Labs:  Recent Labs  02/02/13 1520 02/03/13 0433 02/04/13 0435  HGB 9.8* 8.7* 9.1*    Recent Labs  02/03/13 0433 02/04/13 0435  WBC 7.6 8.0  RBC 2.85* 2.97*  HCT 25.3* 26.4*  PLT 373 388    Recent Labs  02/02/13 1520 02/04/13 0435  NA 136 133*  K 3.6 3.8  CL 99 100  CO2 25 26  BUN 9 7  CREATININE 0.91 0.88  GLUCOSE 99 108*  CALCIUM 9.9 8.9    Recent Labs  02/02/13 1520  INR 1.21    EXAM: General - Patient is Alert, Appropriate and Oriented Extremity - Neurovascular intact Sensation intact distally Dorsiflexion/Plantar flexion intact Swelling has improved. Incision - clean, dry, healing Motor Function - intact, moving foot and toes well on exam.   Assessment/Plan: 2 Days Post-Op Procedure(s) (LRB): IRRIGATION AND DEBRIDEMENT KNEE (Right) Procedure(s) (LRB): IRRIGATION AND DEBRIDEMENT KNEE (Right) Past Medical History  Diagnosis Date  . Impaired hearing     NO HEARING AIDS  . Anxiety   . Glaucoma     BILATERAL  . Cataract     LEFT EYE  . Hypertension   . GERD (gastroesophageal reflux disease)   . Arthritis   . Infection 02/01/13    RIGHT KNEE  S/P RT UNIKNEE  REPLACEMENT ON 01/03/13 - IS TO HAVE SURGERY 7/16 I&D   Principal Problem:   Right knee skin infection Active Problems:   Hyponatremia   Anemia  Estimated body mass index is 25.25 kg/(m^2) as calculated from the following:   Height as of this encounter: 6' (1.829 m).   Weight as of this encounter: 84.482 kg (186 lb 4 oz). Up with therapy Discharge home with home health Diet - Cardiac diet Follow up - in 4 days, next Tuesday 02/08/2013 Activity - WBAT Disposition - Home Condition Upon Discharge - Stable D/C Meds - See DC Summary DVT Prophylaxis - Aspirin  Lennex Pietila 02/04/2013, 9:22 AM

## 2013-02-04 NOTE — Progress Notes (Signed)
Physical Therapy Treatment Patient Details Name: Roger Cameron MRN: [id number] DOB: [birth year] Today's Date: 02/04/2013 Time: 0940-1000 PT Time Calculation (min): 20 min  PT Assessment / Plan / Recommendation  PT Comments   Pt much improved in gait stability. Pt now oplans to DC to his home. He will have 24/7 caregivers. Pt is excited.  Follow Up Recommendations  Home health PT;Supervision/Assistance - 24 hour (pt to DC to his home.)     Does the patient have the potential to tolerate intense rehabilitation     Barriers to Discharge        Equipment Recommendations       Recommendations for Other Services    Frequency 7X/week   Progress towards PT Goals Progress towards PT goals: Progressing toward goals  Plan Current plan remains appropriate    Precautions / Restrictions Precautions Precautions: Knee;Fall Precaution Comments: no ROM until 7/21. Required Braces or Orthoses: Knee Immobilizer - Right Knee Immobilizer - Right: On at all times   Pertinent Vitals/Pain <4 after meds. Ice applied.    Mobility  Bed Mobility Bed Mobility: Supine to Sit Supine to Sit: 4: Min assist;HOB elevated;With rails Details for Bed Mobility Assistance: pt sleeps in recliner at home. Transfers Sit to Stand: 4: Min guard;From bed;With upper extremity assist Stand to Sit: With upper extremity assist;To chair/3-in-1;5: Supervision Details for Transfer Assistance: cues for hand placement and R leg.pt recalled to step RLE forward and reach back Ambulation/Gait Ambulation/Gait Assistance: 4: Min guard Ambulation Distance (Feet): 200 Feet Ambulation/Gait Assistance Details: pt initially is tremulous but improves with mobility. Gait Pattern: Step-through pattern General Gait Details: les antalgia.    Exercises     PT Diagnosis:    PT Problem List:   PT Treatment Interventions:     PT Goals (current goals can now be found in the care plan section)    Visit Information  Last PT Received  On: 02/04/13 Assistance Needed: +1    Subjective Data      Cognition  Cognition Arousal/Alertness: Awake/alert Behavior During Therapy: [facility] for tasks assessed/performed    Balance     End of Session PT - End of Session Activity Tolerance: Patient tolerated treatment well Patient left: in chair;with call bell/phone within reach;with family/visitor present Nurse Communication: Mobility status   GP     [name] 02/04/2013, 10:14 AM

## 2013-02-04 NOTE — Discharge Summary (Signed)
Physician Discharge Summary   Patient ID: Roger Cameron MRN: [id number] DOB/AGE: 77-Nov-1930 77 y.o.  Admit date: 02/02/2013 Discharge date: 02/04/2013  Primary Diagnosis:  Soft tissue infection right knee.  Admission Diagnoses:  Past Medical History  Diagnosis Date  . Impaired hearing     NO HEARING AIDS  . Anxiety   . Glaucoma     BILATERAL  . Cataract     LEFT EYE  . Hypertension   . GERD (gastroesophageal reflux disease)   . Arthritis   . Infection 02/01/13    RIGHT KNEE  S/P RT UNIKNEE REPLACEMENT ON 01/03/13 - IS TO HAVE SURGERY 7/16 I&D   Discharge Diagnoses:   Principal Problem:   Right knee skin infection Active Problems:   Hyponatremia   Anemia  Estimated body mass index is 25.25 kg/(m^2) as calculated from the following:   Height as of this encounter: 6' (1.829 m).   Weight as of this encounter: 84.482 kg (186 lb 4 oz).  Procedure:  Procedure(s) (LRB): IRRIGATION AND DEBRIDEMENT KNEE (Right)   Consults: None  HPI: Mr. Formby is an 79 year old male who underwent a  right knee unicompartmental replacement approximately 3 weeks ago. He  had developed a hematoma postop from anticoagulation. He had wound  drainage and upon evaluation in the office yesterday I was concerned  about the possibility of soft tissue infection. He was taken to the  operating room today for operative irrigation and debridement.  Laboratory Data: Admission on 02/02/2013  Component Date Value Range Status  . WBC 02/02/2013 8.9  4.0 - 10.5 K/uL Final  . RBC 02/02/2013 3.25* 4.22 - 5.81 MIL/uL Final  . Hemoglobin 02/02/2013 9.8* 13.0 - 17.0 g/dL Final  . HCT 44/09/4740 28.8* 39.0 - 52.0 % Final  . MCV 02/02/2013 88.6  78.0 - 100.0 fL Final  . MCH 02/02/2013 30.2  26.0 - 34.0 pg Final  . MCHC 02/02/2013 34.0  30.0 - 36.0 g/dL Final  . RDW 59/56/3875 12.6  11.5 - 15.5 % Final  . Platelets 02/02/2013 486* 150 - 400 K/uL Final  . Sodium 02/02/2013 136  135 - 145 mEq/L Final  .  Potassium 02/02/2013 3.6  3.5 - 5.1 mEq/L Final  . Chloride 02/02/2013 99  96 - 112 mEq/L Final  . CO2 02/02/2013 25  19 - 32 mEq/L Final  . Glucose, Bld 02/02/2013 99  70 - 99 mg/dL Final  . BUN 64/33/2951 9  6 - 23 mg/dL Final  . Creatinine, Ser 02/02/2013 0.91  0.50 - 1.35 mg/dL Final  . Calcium 88/41/6606 9.9  8.4 - 10.5 mg/dL Final  . GFR calc non Af Amer 02/02/2013 77* >90 mL/min Final  . GFR calc Af Amer 02/02/2013 89* >90 mL/min Final   Comment:                                 The eGFR has been calculated                          using the CKD EPI equation.                          This calculation has not been                          validated in all clinical  situations.                          eGFR's persistently                          <90 mL/min signify                          possible Chronic Kidney Disease.  [name] Prothrombin Time 02/02/2013 15.0  11.6 - 15.2 seconds Final  . INR 02/02/2013 1.21  0.00 - 1.49 Final  . aPTT 02/02/2013 41* 24 - 37 seconds Final   Comment:                                 IF BASELINE aPTT IS ELEVATED,                          SUGGEST PATIENT RISK ASSESSMENT                          BE USED TO DETERMINE APPROPRIATE                          ANTICOAGULANT THERAPY.  . WBC 02/03/2013 7.6  4.0 - 10.5 K/uL Final  . RBC 02/03/2013 2.85* 4.22 - 5.81 MIL/uL Final  . Hemoglobin 02/03/2013 8.7* 13.0 - 17.0 g/dL Final  . HCT 16/04/9603 25.3* 39.0 - 52.0 % Final  . MCV 02/03/2013 88.8  78.0 - 100.0 fL Final  . MCH 02/03/2013 30.5  26.0 - 34.0 pg Final  . MCHC 02/03/2013 34.4  30.0 - 36.0 g/dL Final  . RDW 54/03/8118 12.7  11.5 - 15.5 % Final  . Platelets 02/03/2013 373  150 - 400 K/uL Final   Comment: SPECIMEN CHECKED FOR CLOTS                          DELTA CHECK NOTED  . Specimen Description 02/02/2013 WOUND RIGHT KNEE   Final  . Special Requests 02/02/2013 NONE   Final  . Gram Stain 02/02/2013    Final                    Value:FEW WBC PRESENT, PREDOMINANTLY PMN                         NO SQUAMOUS EPITHELIAL CELLS SEEN                         NO ORGANISMS SEEN  . Culture 02/02/2013 NO GROWTH 1 DAY   Final  . Report Status 02/02/2013 PENDING   Incomplete  . Specimen Description 02/02/2013 WOUND RIGHT KNEE   Final  . Special Requests 02/02/2013 NONE   Final  . Gram Stain 02/02/2013    Final                   Value:FEW WBC PRESENT, PREDOMINANTLY PMN                         NO SQUAMOUS EPITHELIAL CELLS SEEN  NO ORGANISMS SEEN  . Culture 02/02/2013 NO ANAEROBES ISOLATED; CULTURE IN PROGRESS FOR 5 DAYS   Final  . Report Status 02/02/2013 PENDING   Incomplete  . Sodium 02/04/2013 133* 135 - 145 mEq/L Final  . Potassium 02/04/2013 3.8  3.5 - 5.1 mEq/L Final  . Chloride 02/04/2013 100  96 - 112 mEq/L Final  . CO2 02/04/2013 26  19 - 32 mEq/L Final  . Glucose, Bld 02/04/2013 108* 70 - 99 mg/dL Final  . BUN 16/04/9603 7  6 - 23 mg/dL Final  . Creatinine, Ser 02/04/2013 0.88  0.50 - 1.35 mg/dL Final  . Calcium 54/03/8118 8.9  8.4 - 10.5 mg/dL Final  . GFR calc non Af Amer 02/04/2013 78* >90 mL/min Final  . GFR calc Af Amer 02/04/2013 >90  >90 mL/min Final   Comment:                                 The eGFR has been calculated                          using the CKD EPI equation.                          This calculation has not been                          validated in all clinical                          situations.                          eGFR's persistently                          <90 mL/min signify                          possible Chronic Kidney Disease.  . WBC 02/04/2013 8.0  4.0 - 10.5 K/uL Final  . RBC 02/04/2013 2.97* 4.22 - 5.81 MIL/uL Final  . Hemoglobin 02/04/2013 9.1* 13.0 - 17.0 g/dL Final  . HCT 14/78/2956 26.4* 39.0 - 52.0 % Final  . MCV 02/04/2013 88.9  78.0 - 100.0 fL Final  . MCH 02/04/2013 30.6  26.0 - 34.0 pg Final  . MCHC 02/04/2013 34.5  30.0 - 36.0 g/dL Final   . RDW 21/30/8657 12.8  11.5 - 15.5 % Final  . Platelets 02/04/2013 388  150 - 400 K/uL Final  Admission on 01/03/2013, Discharged on 01/04/2013  Component Date Value Range Status  . WBC 01/04/2013 7.2  4.0 - 10.5 K/uL Final  . RBC 01/04/2013 4.32  4.22 - 5.81 MIL/uL Final  . Hemoglobin 01/04/2013 13.6  13.0 - 17.0 g/dL Final  . HCT 84/69/6295 38.9* 39.0 - 52.0 % Final  . MCV 01/04/2013 90.0  78.0 - 100.0 fL Final  . MCH 01/04/2013 31.5  26.0 - 34.0 pg Final  . MCHC 01/04/2013 35.0  30.0 - 36.0 g/dL Final  . RDW 28/41/3244 12.8  11.5 - 15.5 % Final  . Platelets 01/04/2013 196  150 - 400 K/uL Final  . Sodium 01/04/2013 135  135 - 145 mEq/L Final  .  Potassium 01/04/2013 4.3  3.5 - 5.1 mEq/L Final  . Chloride 01/04/2013 102  96 - 112 mEq/L Final  . CO2 01/04/2013 26  19 - 32 mEq/L Final  . Glucose, Bld 01/04/2013 160* 70 - 99 mg/dL Final  . BUN 16/04/9603 8  6 - 23 mg/dL Final  . Creatinine, Ser 01/04/2013 0.78  0.50 - 1.35 mg/dL Final  . Calcium 54/03/8118 8.9  8.4 - 10.5 mg/dL Final  . GFR calc non Af Amer 01/04/2013 82* >90 mL/min Final  . GFR calc Af Amer 01/04/2013 >90  >90 mL/min Final   Comment:                                 The eGFR has been calculated                          using the CKD EPI equation.                          This calculation has not been                          validated in all clinical                          situations.                          eGFR's persistently                          <90 mL/min signify                          possible Chronic Kidney Disease.  Hospital Outpatient Visit on 12/29/2012  Component Date Value Range Status  . MRSA, PCR 12/29/2012 NEGATIVE  NEGATIVE Final  . Staphylococcus aureus 12/29/2012 NEGATIVE  NEGATIVE Final   Comment:                                 The Xpert SA Assay (FDA                          approved for NASAL specimens                          in patients over 75 years of age),                           is one component of                          a comprehensive surveillance                          program.  Test performance has                          been validated by [facility] for patients greater                          than or equal to 75 year old.                          It is not intended                          to diagnose infection nor to                          guide or monitor treatment.  [name] aPTT 12/29/2012 34  24 - 37 seconds Final  . WBC 12/29/2012 8.6  4.0 - 10.5 K/uL Final  . RBC 12/29/2012 4.94  4.22 - 5.81 MIL/uL Final  . Hemoglobin 12/29/2012 16.0  13.0 - 17.0 g/dL Final  . HCT 91/47/8295 44.5  39.0 - 52.0 % Final  . MCV 12/29/2012 90.1  78.0 - 100.0 fL Final  . MCH 12/29/2012 32.4  26.0 - 34.0 pg Final  . MCHC 12/29/2012 36.0  30.0 - 36.0 g/dL Final  . RDW 62/13/0865 12.9  11.5 - 15.5 % Final  . Platelets 12/29/2012 216  150 - 400 K/uL Final  . Sodium 12/29/2012 136  135 - 145 mEq/L Final  . Potassium 12/29/2012 4.5  3.5 - 5.1 mEq/L Final  . Chloride 12/29/2012 99  96 - 112 mEq/L Final  . CO2 12/29/2012 27  19 - 32 mEq/L Final  . Glucose, Bld 12/29/2012 97  70 - 99 mg/dL Final  . BUN 78/46/9629 9  6 - 23 mg/dL Final  . Creatinine, Ser 12/29/2012 0.83  0.50 - 1.35 mg/dL Final  . Calcium 52/84/1324 10.0  8.4 - 10.5 mg/dL Final  . Total Protein 12/29/2012 7.8  6.0 - 8.3 g/dL Final  . Albumin 40/04/2724 4.2  3.5 - 5.2 g/dL Final  . AST 36/64/4034 21  0 - 37 U/L Final  . ALT 12/29/2012 12  0 - 53 U/L Final  . Alkaline Phosphatase 12/29/2012 64  39 - 117 U/L Final  . Total Bilirubin 12/29/2012 0.8  0.3 - 1.2 mg/dL Final  . GFR calc non Af Amer 12/29/2012 80* >90 mL/min Final  . GFR calc Af Amer 12/29/2012 >90  >90 mL/min Final   Comment:                                 The eGFR has been calculated                          using the CKD EPI equation.                          This calculation has not been                           validated in all clinical                          situations.                          eGFR's persistently                          <  90 mL/min signify                          possible Chronic Kidney Disease.  [name] Prothrombin Time 12/29/2012 14.3  11.6 - 15.2 seconds Final  . INR 12/29/2012 1.13  0.00 - 1.49 Final  . ABO/RH(D) 12/29/2012 O POS   Final  . Antibody Screen 12/29/2012 NEG   Final  . Sample Expiration 12/29/2012 01/06/2013   Final  . Color, Urine 12/29/2012 YELLOW  YELLOW Final  . APPearance 12/29/2012 CLEAR  CLEAR Final  . Specific Gravity, Urine 12/29/2012 1.010  1.005 - 1.030 Final  . pH 12/29/2012 6.5  5.0 - 8.0 Final  . Glucose, UA 12/29/2012 NEGATIVE  NEGATIVE mg/dL Final  . Hgb urine dipstick 12/29/2012 NEGATIVE  NEGATIVE Final  . Bilirubin Urine 12/29/2012 NEGATIVE  NEGATIVE Final  . Ketones, ur 12/29/2012 NEGATIVE  NEGATIVE mg/dL Final  . Protein, ur 86/57/8469 NEGATIVE  NEGATIVE mg/dL Final  . Urobilinogen, UA 12/29/2012 0.2  0.0 - 1.0 mg/dL Final  . Nitrite 62/95/2841 NEGATIVE  NEGATIVE Final  . Leukocytes, UA 12/29/2012 TRACE* NEGATIVE Final  . Squamous Epithelial / LPF 12/29/2012 RARE  RARE Final  . WBC, UA 12/29/2012 0-2  <3 WBC/hpf Final  . RBC / HPF 12/29/2012 0-2  <3 RBC/hpf Final  . Bacteria, UA 12/29/2012 RARE  RARE Final  . ABO/RH(D) 12/29/2012 O POS   Final     X-Rays:No results found.  EKG: Orders placed during the hospital encounter of 01/03/13  . EKG     Hospital Course: ATHANASIOS HELDMAN is a 79 y.o. who was admitted to [facility]. They were brought to the operating room on 02/02/2013 and underwent Procedure(s): IRRIGATION AND DEBRIDEMENT KNEE.  Patient tolerated the procedure well and was later transferred to the recovery room and then to the orthopaedic floor for postoperative care.  They were given PO and IV analgesics for pain control following their surgery.  They were given 24 hours of postoperative antibiotics of  Anti-infectives    Start     Dose/Rate Route Frequency Ordered Stop   02/04/13 1000  doxycycline (VIBRA-TABS) tablet 100 mg     100 mg Oral Every 12 hours 02/04/13 0925     02/04/13 0000  cephALEXin (KEFLEX) 250 MG capsule     250 mg Oral 3 times daily 02/04/13 0928     02/04/13 0000  doxycycline (VIBRA-TABS) 100 MG tablet     100 mg Oral Every 12 hours 02/04/13 0928     02/03/13 2359  ceFAZolin (ANCEF) IVPB 2 g/50 mL premix  Status:  Discontinued     2 g 100 mL/hr over 30 Minutes Intravenous Every 6 hours 02/02/13 2135 02/03/13 0704   02/03/13 0900  ceFAZolin (ANCEF) IVPB 2 g/50 mL premix     2 g 100 mL/hr over 30 Minutes Intravenous Every 6 hours 02/03/13 0837     02/03/13 0730  ceFAZolin (ANCEF) IVPB 2 g/50 mL premix  Status:  Discontinued     2 g 100 mL/hr over 30 Minutes Intravenous Every 6 hours 02/03/13 0704 02/03/13 0837   02/02/13 1445  ceFAZolin (ANCEF) 3 g in dextrose 5 % 50 mL IVPB     3 g 160 mL/hr over 30 Minutes Intravenous 30 min pre-op 02/02/13 1443 02/02/13 1908     and started on DVT prophylaxis in the form of Aspirin.   PT was ordered for mobility.  Discharge planning consulted to help  with postop disposition and equipment needs.  Patient had a decent night on the evening of surgery.  They started to get up OOB with therapy on day one walking about 150 feet.  Continued to work with therapy into day two.  Dressing was changed on day two and the incision was healing well and swelling had improved.  Patient was seen in rounds and was ready to go home later on POD 2.   Discharge Medications: Prior to Admission medications   Medication Sig Start Date End Date Taking? Authorizing Provider  acetaminophen (TYLENOL) 500 MG tablet Take 500 mg by mouth every 6 (six) hours as needed for pain.   Yes Historical Provider, MD  ALPRAZolam Prudy Feeler) 0.5 MG tablet Take 0.5 mg by mouth daily as needed for anxiety.   Yes Historical Provider, MD  aspirin EC 81 MG tablet Take 81 mg by mouth. If needed once a  day for h/a   Yes Historical Provider, MD  atenolol (TENORMIN) 50 MG tablet Take 25 mg by mouth every morning. Takes 1/2 tablet   Yes Historical Provider, MD  hydrochlorothiazide (HYDRODIURIL) 25 MG tablet Take 25 mg by mouth every morning.   Yes Historical Provider, MD  lansoprazole (PREVACID) 30 MG capsule Take 30 mg by mouth daily.    Yes Historical Provider, MD  latanoprost (XALATAN) 0.005 % ophthalmic solution Place 1 drop into both eyes at bedtime.   Yes Historical Provider, MD  valsartan (DIOVAN) 320 MG tablet Take 320 mg by mouth every morning.   Yes Historical Provider, MD  cephALEXin (KEFLEX) 250 MG capsule Take 1 capsule (250 mg total) by mouth 3 (three) times daily. Continuous until procedure 02/04/13   Alexzandrew Julien Girt, PA-C  doxycycline (VIBRA-TABS) 100 MG tablet Take 1 tablet (100 mg total) by mouth every 12 (twelve) hours. 02/04/13   Alexzandrew Julien Girt, PA-C  methocarbamol (ROBAXIN) 500 MG tablet Take 1 tablet (500 mg total) by mouth every 6 (six) hours as needed (muscle spasm). 02/04/13   Alexzandrew Perkins, PA-C  oxycodone (OXY-IR) 5 MG capsule Take 1 capsule (5 mg total) by mouth 2 (two) times daily. 02/04/13   Alexzandrew Julien Girt, PA-C  traMADol (ULTRAM) 50 MG tablet Take 1-2 tablets (50-100 mg total) by mouth every 6 (six) hours as needed (mild pain). 02/04/13   Alexzandrew Julien Girt, PA-C    Diet: Cardiac diet Activity:WBAT Follow-up:in 4 days, Next Thursday 02/08/2013 Disposition - Home Discharged Condition: stable   Discharge Orders   Future Orders Complete By Expires     Call MD / Call 911  As directed     Comments:      If you experience chest pain or shortness of breath, CALL 911 and be transported to the hospital emergency room.  If you develope a fever above 101 F, pus (white drainage) or increased drainage or redness at the wound, or calf pain, call your surgeon's office.    Change dressing  As directed     Comments:      Change dressing daily with sterile 4 x 4  inch gauze dressing and apply TED hose. Do not submerge the incision under water.    Constipation Prevention  As directed     Comments:      Drink plenty of fluids.  Prune juice may be helpful.  You may use a stool softener, such as Colace (over the counter) 100 mg twice a day.  Use MiraLax (over the counter) for constipation as needed.    Diet - low sodium heart  healthy  As directed     Discharge instructions  As directed     Comments:      Pick up stool softner and laxative for home. Do not submerge incision under water. May shower. Continue to use ice for pain and swelling from surgery.    Do not put a pillow under the knee. Place it under the heel.  As directed     Do not sit on low chairs, stoools or toilet seats, as it may be difficult to get up from low surfaces  As directed     Driving restrictions  As directed     Comments:      No driving until released by the physician.    Increase activity slowly as tolerated  As directed     Lifting restrictions  As directed     Comments:      No lifting until released by the physician.    Patient may shower  As directed     Comments:      You may shower without a dressing once there is no drainage.  Do not wash over the wound.  If drainage remains, do not shower until drainage stops.    TED hose  As directed     Comments:      Use stockings (TED hose) for 3 weeks on both leg(s).  You may remove them at night for sleeping.    Weight bearing as tolerated  As directed         Medication List         acetaminophen 500 MG tablet  Commonly known as:  TYLENOL  Take 500 mg by mouth every 6 (six) hours as needed for pain.     ALPRAZolam 0.5 MG tablet  Commonly known as:  XANAX  Take 0.5 mg by mouth daily as needed for anxiety.     aspirin EC 81 MG tablet  Take 81 mg by mouth. If needed once a day for h/a     atenolol 50 MG tablet  Commonly known as:  TENORMIN  Take 25 mg by mouth every morning. Takes 1/2 tablet     cephALEXin 250  MG capsule  Commonly known as:  KEFLEX  Take 1 capsule (250 mg total) by mouth 3 (three) times daily. Continuous until procedure     doxycycline 100 MG tablet  Commonly known as:  VIBRA-TABS  Take 1 tablet (100 mg total) by mouth every 12 (twelve) hours.     hydrochlorothiazide 25 MG tablet  Commonly known as:  HYDRODIURIL  Take 25 mg by mouth every morning.     lansoprazole 30 MG capsule  Commonly known as:  PREVACID  Take 30 mg by mouth daily.     latanoprost 0.005 % ophthalmic solution  Commonly known as:  XALATAN  Place 1 drop into both eyes at bedtime.     methocarbamol 500 MG tablet  Commonly known as:  ROBAXIN  Take 1 tablet (500 mg total) by mouth every 6 (six) hours as needed (muscle spasm).     oxycodone 5 MG capsule  Commonly known as:  OXY-IR  Take 1 capsule (5 mg total) by mouth 2 (two) times daily.     traMADol 50 MG tablet  Commonly known as:  ULTRAM  Take 1-2 tablets (50-100 mg total) by mouth every 6 (six) hours as needed (mild pain).     valsartan 320 MG tablet  Commonly known as:  DIOVAN  Take 320 mg by  mouth every morning.           Follow-up Information   Follow up with Loanne Drilling, MD On 02/08/2013. (Needs to be seen on Tuesday as per Dr. Lequita Halt.)    Contact information:   [address] [place] [address] [phone]       Signed: Patrica Duel 02/04/2013, 9:30 AM

## 2013-02-04 NOTE — Progress Notes (Signed)
Physical Therapy Treatment Patient Details Name: Roger Cameron MRN: [id number] DOB: July 22, 1928 Today's Date: 02/04/2013 Time: 4098-1191 PT Time Calculation (min): 9 min  PT Assessment / Plan / Recommendation  PT Comments   Instructed pt and daughter on how to prop R heel up to facilitate knee extension.   Follow Up Recommendations  Home health PT;Supervision/Assistance - 24 hour (pt to DC to his home.)     Does the patient have the potential to tolerate intense rehabilitation     Barriers to Discharge        Equipment Recommendations       Recommendations for Other Services    Frequency 7X/week   Progress towards PT Goals Progress towards PT goals: Progressing toward goals  Plan Current plan remains appropriate    Precautions / Restrictions Precautions Precautions: Knee;Fall Precaution Comments: no ROM until 7/21. Required Braces or Orthoses: Knee Immobilizer - Right Knee Immobilizer - Right: On at all times   Pertinent Vitals/Pain sore    Mobility  Transfers Sit to Stand: 4: Min guard;From bed;With upper extremity assist Stand to Sit: With upper extremity assist;To chair/3-in-1;5: Supervision Details for Transfer Assistance: cues for hand placement and R leg.pt recalled to step RLE forward and reach back Ambulation/Gait Ambulation/Gait Assistance: 5: Supervision Ambulation Distance (Feet): 100 Feet Assistive device: Rolling walker Ambulation/Gait Assistance Details: pt initially is tremulous but improves with mobility. Gait Pattern: Step-through pattern General Gait Details: les antalgia.    Exercises     PT Diagnosis:    PT Problem List:   PT Treatment Interventions:     PT Goals (current goals can now be found in the care plan section)    Visit Information  Last PT Received On: 02/04/13 Assistance Needed: +1    Subjective Data      Cognition  Cognition Arousal/Alertness: Awake/alert Behavior During Therapy: [facility] for tasks assessed/performed     Balance     End of Session PT - End of Session Activity Tolerance: Patient tolerated treatment well Patient left: in chair;with call bell/phone within reach;with family/visitor present Nurse Communication: Mobility status   GP     [name] 02/04/2013, 11:24 AM

## 2013-02-05 LAB — WOUND CULTURE: Culture: NO GROWTH

## 2013-02-07 LAB — ANAEROBIC CULTURE

## 2013-02-08 DIAGNOSIS — Z96659 Presence of unspecified artificial knee joint: Secondary | ICD-10-CM | POA: Diagnosis not present

## 2013-02-15 DIAGNOSIS — Z96659 Presence of unspecified artificial knee joint: Secondary | ICD-10-CM | POA: Diagnosis not present

## 2013-02-22 DIAGNOSIS — Z96659 Presence of unspecified artificial knee joint: Secondary | ICD-10-CM | POA: Diagnosis not present

## 2013-03-09 DIAGNOSIS — Z23 Encounter for immunization: Secondary | ICD-10-CM | POA: Diagnosis not present

## 2013-03-09 DIAGNOSIS — E785 Hyperlipidemia, unspecified: Secondary | ICD-10-CM | POA: Diagnosis not present

## 2013-03-09 DIAGNOSIS — K219 Gastro-esophageal reflux disease without esophagitis: Secondary | ICD-10-CM | POA: Diagnosis not present

## 2013-03-09 DIAGNOSIS — Z6825 Body mass index (BMI) 25.0-25.9, adult: Secondary | ICD-10-CM | POA: Diagnosis not present

## 2013-03-09 DIAGNOSIS — R7301 Impaired fasting glucose: Secondary | ICD-10-CM | POA: Diagnosis not present

## 2013-03-09 DIAGNOSIS — F341 Dysthymic disorder: Secondary | ICD-10-CM | POA: Diagnosis not present

## 2013-03-09 DIAGNOSIS — M109 Gout, unspecified: Secondary | ICD-10-CM | POA: Diagnosis not present

## 2013-03-09 DIAGNOSIS — I10 Essential (primary) hypertension: Secondary | ICD-10-CM | POA: Diagnosis not present

## 2013-03-10 DIAGNOSIS — Z96659 Presence of unspecified artificial knee joint: Secondary | ICD-10-CM | POA: Diagnosis not present

## 2013-04-07 DIAGNOSIS — Z96659 Presence of unspecified artificial knee joint: Secondary | ICD-10-CM | POA: Diagnosis not present

## 2013-04-15 DIAGNOSIS — Z96659 Presence of unspecified artificial knee joint: Secondary | ICD-10-CM | POA: Diagnosis not present

## 2013-04-19 DIAGNOSIS — Z471 Aftercare following joint replacement surgery: Secondary | ICD-10-CM | POA: Diagnosis not present

## 2013-05-10 DIAGNOSIS — Z471 Aftercare following joint replacement surgery: Secondary | ICD-10-CM | POA: Diagnosis not present

## 2013-05-13 ENCOUNTER — Encounter (HOSPITAL_COMMUNITY): Payer: Self-pay | Admitting: Pharmacy Technician

## 2013-05-13 NOTE — Progress Notes (Signed)
Chest x-ray 12/29/12 on EPIC, EKG 12/29/12 on EPIC

## 2013-05-13 NOTE — Progress Notes (Signed)
Need orders please - pt surgery is TUES 05/17/13 - thank you

## 2013-05-13 NOTE — Patient Instructions (Addendum)
20 GARMON DEHN  05/13/2013   Your procedure is scheduled on: 05/17/13  Report to [facility] at 12:30 PM.  Call this number if you have problems the morning of surgery [phone]: [phone]   Remember:   Do not eat food After Midnight, clear liquids from midnight until 9:00am on 05/17/13 then nothing.      Take these medicines the morning of surgery with A SIP OF WATER: alprazolam if needed, atenolol, prevacid if needed   Do not wear jewelry, make-up or nail polish.  Do not wear lotions, powders, or perfumes. You may wear deodorant.  Do not shave 48 hours prior to surgery. Men may shave face and neck.  Do not bring valuables to the hospital.  Contacts, dentures or bridgework may not be worn into surgery.  Leave suitcase in the car. After surgery it may be brought to your room.  For patients admitted to the hospital, checkout time is 11:00 AM the day of discharge.   Please read over the following fact sheets that you were given: blood fact sheet, clear liquids fact sheet Birdie Sons, RN  pre op nurse call if needed [phone]    FAILURE TO FOLLOW THESE INSTRUCTIONS MAY RESULT IN CANCELLATION OF YOUR SURGERY   Patient Signature: ___________________________________________

## 2013-05-16 ENCOUNTER — Encounter (HOSPITAL_COMMUNITY)
Admission: RE | Admit: 2013-05-16 | Discharge: 2013-05-16 | Disposition: A | Discharge status: Home / Self Care | Payer: Medicare Other | Source: Ambulatory Visit | Attending: Orthopedic Surgery | Admitting: Orthopedic Surgery

## 2013-05-16 ENCOUNTER — Encounter (HOSPITAL_COMMUNITY): Payer: Self-pay

## 2013-05-16 ENCOUNTER — Encounter (INDEPENDENT_AMBULATORY_CARE_PROVIDER_SITE_OTHER): Payer: Self-pay

## 2013-05-16 DIAGNOSIS — M25569 Pain in unspecified knee: Secondary | ICD-10-CM | POA: Diagnosis not present

## 2013-05-16 DIAGNOSIS — T8450XA Infection and inflammatory reaction due to unspecified internal joint prosthesis, initial encounter: Secondary | ICD-10-CM | POA: Diagnosis not present

## 2013-05-16 DIAGNOSIS — K219 Gastro-esophageal reflux disease without esophagitis: Secondary | ICD-10-CM | POA: Diagnosis present

## 2013-05-16 DIAGNOSIS — H9190 Unspecified hearing loss, unspecified ear: Secondary | ICD-10-CM | POA: Diagnosis present

## 2013-05-16 DIAGNOSIS — L089 Local infection of the skin and subcutaneous tissue, unspecified: Secondary | ICD-10-CM | POA: Diagnosis not present

## 2013-05-16 DIAGNOSIS — F411 Generalized anxiety disorder: Secondary | ICD-10-CM | POA: Diagnosis not present

## 2013-05-16 DIAGNOSIS — T847XXA Infection and inflammatory reaction due to other internal orthopedic prosthetic devices, implants and grafts, initial encounter: Secondary | ICD-10-CM | POA: Diagnosis not present

## 2013-05-16 DIAGNOSIS — Z01812 Encounter for preprocedural laboratory examination: Secondary | ICD-10-CM | POA: Diagnosis not present

## 2013-05-16 DIAGNOSIS — Z96659 Presence of unspecified artificial knee joint: Secondary | ICD-10-CM | POA: Diagnosis not present

## 2013-05-16 DIAGNOSIS — I10 Essential (primary) hypertension: Secondary | ICD-10-CM | POA: Diagnosis not present

## 2013-05-16 LAB — CBC
HCT: 37.9 % — ABNORMAL LOW (ref 39.0–52.0)
Hemoglobin: 13 g/dL (ref 13.0–17.0)
WBC: 10.6 10*3/uL — ABNORMAL HIGH (ref 4.0–10.5)

## 2013-05-16 LAB — TYPE AND SCREEN

## 2013-05-16 LAB — BASIC METABOLIC PANEL
BUN: 24 mg/dL — ABNORMAL HIGH (ref 6–23)
CO2: 24 mEq/L (ref 19–32)
Chloride: 96 mEq/L (ref 96–112)
Glucose, Bld: 104 mg/dL — ABNORMAL HIGH (ref 70–99)
Potassium: 5.4 mEq/L — ABNORMAL HIGH (ref 3.5–5.1)

## 2013-05-16 NOTE — Progress Notes (Signed)
05/16/13 1117  OBSTRUCTIVE SLEEP APNEA  Have you ever been diagnosed with sleep apnea through a sleep study? No  Do you snore loudly (loud enough to be heard through closed doors)?  1  Do you often feel tired, fatigued, or sleepy during the daytime? 0  Has anyone observed you stop breathing during your sleep? 0  Do you have, or are you being treated for high blood pressure? 1  BMI more than 35 kg/m2? 0  Age over 77 years old? 1  Neck circumference greater than 40 cm/18 inches? 0  Gender: 1  Obstructive Sleep Apnea Score 4  Score 4 or greater  Results sent to PCP

## 2013-05-17 ENCOUNTER — Inpatient Hospital Stay (HOSPITAL_COMMUNITY)
Admission: RE | Admit: 2013-05-17 | Discharge: 2013-05-19 | DRG: 487 | Disposition: A | Discharge status: Home Health | Payer: Medicare Other | Source: Ambulatory Visit | Attending: Orthopedic Surgery | Admitting: Orthopedic Surgery

## 2013-05-17 ENCOUNTER — Ambulatory Visit (HOSPITAL_COMMUNITY): Payer: Medicare Other | Admitting: Anesthesiology

## 2013-05-17 ENCOUNTER — Encounter (HOSPITAL_COMMUNITY)
Admission: RE | Disposition: A | Discharge status: Home Health | Payer: Self-pay | Source: Ambulatory Visit | Attending: Orthopedic Surgery

## 2013-05-17 ENCOUNTER — Other Ambulatory Visit: Payer: Self-pay | Admitting: Orthopedic Surgery

## 2013-05-17 ENCOUNTER — Encounter (HOSPITAL_COMMUNITY): Payer: Self-pay | Admitting: Anesthesiology

## 2013-05-17 ENCOUNTER — Encounter (HOSPITAL_COMMUNITY): Payer: Medicare Other | Admitting: Anesthesiology

## 2013-05-17 DIAGNOSIS — F411 Generalized anxiety disorder: Secondary | ICD-10-CM | POA: Diagnosis not present

## 2013-05-17 DIAGNOSIS — Y831 Surgical operation with implant of artificial internal device as the cause of abnormal reaction of the patient, or of later complication, without mention of misadventure at the time of the procedure: Secondary | ICD-10-CM | POA: Diagnosis present

## 2013-05-17 DIAGNOSIS — H9190 Unspecified hearing loss, unspecified ear: Secondary | ICD-10-CM

## 2013-05-17 DIAGNOSIS — Z96659 Presence of unspecified artificial knee joint: Secondary | ICD-10-CM

## 2013-05-17 DIAGNOSIS — K219 Gastro-esophageal reflux disease without esophagitis: Secondary | ICD-10-CM | POA: Diagnosis present

## 2013-05-17 DIAGNOSIS — T8450XA Infection and inflammatory reaction due to unspecified internal joint prosthesis, initial encounter: Secondary | ICD-10-CM | POA: Diagnosis not present

## 2013-05-17 DIAGNOSIS — L089 Local infection of the skin and subcutaneous tissue, unspecified: Secondary | ICD-10-CM | POA: Diagnosis not present

## 2013-05-17 DIAGNOSIS — Z01812 Encounter for preprocedural laboratory examination: Secondary | ICD-10-CM

## 2013-05-17 DIAGNOSIS — I10 Essential (primary) hypertension: Secondary | ICD-10-CM | POA: Diagnosis not present

## 2013-05-17 DIAGNOSIS — H409 Unspecified glaucoma: Secondary | ICD-10-CM | POA: Insufficient documentation

## 2013-05-17 DIAGNOSIS — T847XXA Infection and inflammatory reaction due to other internal orthopedic prosthetic devices, implants and grafts, initial encounter: Secondary | ICD-10-CM | POA: Diagnosis not present

## 2013-05-17 HISTORY — PX: IRRIGATION AND DEBRIDEMENT KNEE: SHX5185

## 2013-05-17 LAB — URINALYSIS, ROUTINE W REFLEX MICROSCOPIC
Bilirubin Urine: NEGATIVE
Glucose, UA: NEGATIVE mg/dL
Hgb urine dipstick: NEGATIVE
Ketones, ur: NEGATIVE mg/dL
Nitrite: NEGATIVE
Protein, ur: NEGATIVE mg/dL
Specific Gravity, Urine: 1.021 (ref 1.005–1.030)
Urobilinogen, UA: 1 mg/dL (ref 0.0–1.0)
pH: 7 (ref 5.0–8.0)

## 2013-05-17 LAB — COMPREHENSIVE METABOLIC PANEL
ALT: 29 U/L (ref 0–53)
AST: 25 U/L (ref 0–37)
Albumin: 3.5 g/dL (ref 3.5–5.2)
Alkaline Phosphatase: 145 U/L — ABNORMAL HIGH (ref 39–117)
BUN: 17 mg/dL (ref 6–23)
CO2: 24 mEq/L (ref 19–32)
Calcium: 10 mg/dL (ref 8.4–10.5)
Chloride: 100 mEq/L (ref 96–112)
Creatinine, Ser: 0.85 mg/dL (ref 0.50–1.35)
GFR calc Af Amer: >90 mL/min (ref >90)
GFR calc non Af Amer: 79 mL/min — ABNORMAL LOW (ref >90)
Glucose, Bld: 101 mg/dL — ABNORMAL HIGH (ref 70–99)
Potassium: 4 mEq/L (ref 3.5–5.1)
Sodium: 133 mEq/L — ABNORMAL LOW (ref 135–145)
Total Bilirubin: 0.6 mg/dL (ref 0.3–1.2)
Total Protein: 8 g/dL (ref 6.0–8.3)

## 2013-05-17 LAB — PROTIME-INR
INR: 1.21 (ref 0.00–1.49)
Prothrombin Time: 15 seconds (ref 11.6–15.2)

## 2013-05-17 LAB — APTT: aPTT: 34 seconds (ref 24–37)

## 2013-05-17 LAB — URINE MICROSCOPIC-ADD ON

## 2013-05-17 SURGERY — IRRIGATION AND DEBRIDEMENT KNEE
Anesthesia: General | Site: Knee | Laterality: Right | Wound class: Dirty or Infected

## 2013-05-17 MED ORDER — MORPHINE SULFATE 10 MG/ML IJ SOLN: 1.0000 mg | INTRAMUSCULAR | Status: DC | PRN

## 2013-05-17 MED ORDER — VANCOMYCIN HCL 1000 MG IV SOLR
INTRAVENOUS | Status: DC | PRN
  Administered 2013-05-17: 1000 mg

## 2013-05-17 MED ORDER — IRBESARTAN 300 MG PO TABS
300.0000 mg | ORAL_TABLET | Freq: Every day | ORAL | Status: DC
  Administered 2013-05-18 – 2013-05-19 (×2): 300 mg via ORAL
  Filled 2013-05-17 (×3): qty 1

## 2013-05-17 MED ORDER — KETOROLAC TROMETHAMINE 30 MG/ML IJ SOLN
INTRAMUSCULAR | Status: AC
  Filled 2013-05-17: qty 1

## 2013-05-17 MED ORDER — ACETAMINOPHEN 650 MG RE SUPP: 650.0000 mg | Freq: Four times a day (QID) | RECTAL | Status: DC | PRN

## 2013-05-17 MED ORDER — PROMETHAZINE HCL 25 MG/ML IJ SOLN: 6.2500 mg | INTRAMUSCULAR | Status: DC | PRN

## 2013-05-17 MED ORDER — DIPHENHYDRAMINE HCL 12.5 MG/5ML PO ELIX: 12.5000 mg | ORAL_SOLUTION | ORAL | Status: DC | PRN

## 2013-05-17 MED ORDER — FENTANYL CITRATE 0.05 MG/ML IJ SOLN
INTRAMUSCULAR | Status: AC
  Filled 2013-05-17: qty 2

## 2013-05-17 MED ORDER — TRAMADOL HCL 50 MG PO TABS: 50.0000 mg | ORAL_TABLET | Freq: Four times a day (QID) | ORAL | Status: DC | PRN

## 2013-05-17 MED ORDER — DOCUSATE SODIUM 100 MG PO CAPS
100.0000 mg | ORAL_CAPSULE | Freq: Two times a day (BID) | ORAL | Status: DC
  Administered 2013-05-17 – 2013-05-19 (×4): 100 mg via ORAL
  Filled 2013-05-17 (×5): qty 1

## 2013-05-17 MED ORDER — ONDANSETRON HCL 4 MG/2ML IJ SOLN
INTRAMUSCULAR | Status: DC | PRN
  Administered 2013-05-17: 4 mg via INTRAVENOUS

## 2013-05-17 MED ORDER — BISACODYL 10 MG RE SUPP: 10.0000 mg | Freq: Every day | RECTAL | Status: DC | PRN

## 2013-05-17 MED ORDER — ASPIRIN EC 325 MG PO TBEC
325.0000 mg | DELAYED_RELEASE_TABLET | Freq: Every day | ORAL | Status: DC
  Administered 2013-05-18 – 2013-05-19 (×2): 325 mg via ORAL
  Filled 2013-05-17 (×3): qty 1

## 2013-05-17 MED ORDER — PHENYLEPHRINE HCL 10 MG/ML IJ SOLN
INTRAMUSCULAR | Status: DC | PRN
  Administered 2013-05-17 (×2): 80 ug via INTRAVENOUS

## 2013-05-17 MED ORDER — METHOCARBAMOL 100 MG/ML IJ SOLN
500.0000 mg | Freq: Four times a day (QID) | INTRAVENOUS | Status: DC | PRN
  Administered 2013-05-17: 500 mg via INTRAVENOUS
  Filled 2013-05-17: qty 5

## 2013-05-17 MED ORDER — METHOCARBAMOL 500 MG PO TABS: 500.0000 mg | ORAL_TABLET | Freq: Four times a day (QID) | ORAL | Status: DC | PRN

## 2013-05-17 MED ORDER — CEFAZOLIN SODIUM-DEXTROSE 2-3 GM-% IV SOLR
2.0000 g | Freq: Four times a day (QID) | INTRAVENOUS | Status: DC
  Administered 2013-05-17 – 2013-05-19 (×6): 2 g via INTRAVENOUS
  Filled 2013-05-17 (×8): qty 50

## 2013-05-17 MED ORDER — VANCOMYCIN HCL 1000 MG IV SOLR
INTRAVENOUS | Status: AC
  Filled 2013-05-17: qty 1000

## 2013-05-17 MED ORDER — CEFAZOLIN SODIUM-DEXTROSE 2-3 GM-% IV SOLR
2.0000 g | INTRAVENOUS | Status: AC
  Administered 2013-05-17: 2 g via INTRAVENOUS

## 2013-05-17 MED ORDER — ACETAMINOPHEN 500 MG PO TABS
1000.0000 mg | ORAL_TABLET | Freq: Four times a day (QID) | ORAL | Status: AC
  Administered 2013-05-17 – 2013-05-18 (×3): 1000 mg via ORAL
  Filled 2013-05-17 (×4): qty 2

## 2013-05-17 MED ORDER — FLEET ENEMA 7-19 GM/118ML RE ENEM: 1.0000 | ENEMA | Freq: Once | RECTAL | Status: AC | PRN

## 2013-05-17 MED ORDER — ALPRAZOLAM 0.5 MG PO TABS
0.5000 mg | ORAL_TABLET | Freq: Every day | ORAL | Status: DC | PRN
  Administered 2013-05-19: 0.5 mg via ORAL
  Filled 2013-05-17: qty 1

## 2013-05-17 MED ORDER — HYDROMORPHONE HCL PF 1 MG/ML IJ SOLN
INTRAMUSCULAR | Status: AC
  Filled 2013-05-17: qty 1

## 2013-05-17 MED ORDER — POLYETHYLENE GLYCOL 3350 17 G PO PACK
17.0000 g | PACK | Freq: Every day | ORAL | Status: DC | PRN
  Filled 2013-05-17: qty 1

## 2013-05-17 MED ORDER — PHENOL 1.4 % MT LIQD: 1.0000 | OROMUCOSAL | Status: DC | PRN

## 2013-05-17 MED ORDER — HYDROCHLOROTHIAZIDE 25 MG PO TABS
25.0000 mg | ORAL_TABLET | Freq: Every morning | ORAL | Status: DC
Start: 2013-05-18 — End: 2013-05-19
  Administered 2013-05-18 – 2013-05-19 (×2): 25 mg via ORAL
  Filled 2013-05-17 (×4): qty 1

## 2013-05-17 MED ORDER — SODIUM CHLORIDE 0.9 % IV SOLN
INTRAVENOUS | Status: DC
  Administered 2013-05-17 – 2013-05-18 (×2): via INTRAVENOUS

## 2013-05-17 MED ORDER — MENTHOL 3 MG MT LOZG
1.0000 | LOZENGE | OROMUCOSAL | Status: DC | PRN
  Filled 2013-05-17: qty 9

## 2013-05-17 MED ORDER — OXYCODONE HCL 5 MG PO TABS
5.0000 mg | ORAL_TABLET | ORAL | Status: DC | PRN
  Administered 2013-05-18 – 2013-05-19 (×5): 5 mg via ORAL
  Administered 2013-05-19: 10 mg via ORAL
  Filled 2013-05-17: qty 1
  Filled 2013-05-17: qty 2
  Filled 2013-05-17 (×4): qty 1
  Filled 2013-05-17: qty 2

## 2013-05-17 MED ORDER — FENTANYL CITRATE 0.05 MG/ML IJ SOLN
25.0000 ug | INTRAMUSCULAR | Status: DC | PRN
  Administered 2013-05-17 (×2): 50 ug via INTRAVENOUS

## 2013-05-17 MED ORDER — METOCLOPRAMIDE HCL 5 MG/ML IJ SOLN: 5.0000 mg | Freq: Three times a day (TID) | INTRAMUSCULAR | Status: DC | PRN

## 2013-05-17 MED ORDER — PANTOPRAZOLE SODIUM 40 MG PO TBEC
40.0000 mg | DELAYED_RELEASE_TABLET | Freq: Every day | ORAL | Status: DC
  Administered 2013-05-18 – 2013-05-19 (×2): 40 mg via ORAL
  Filled 2013-05-17 (×4): qty 1

## 2013-05-17 MED ORDER — HYDROMORPHONE HCL PF 1 MG/ML IJ SOLN
INTRAMUSCULAR | Status: DC | PRN
  Administered 2013-05-17 (×2): 1 mg via INTRAVENOUS

## 2013-05-17 MED ORDER — CHLORHEXIDINE GLUCONATE 4 % EX LIQD: 60.0000 mL | Freq: Once | CUTANEOUS | Status: DC

## 2013-05-17 MED ORDER — LIDOCAINE HCL (CARDIAC) 10 MG/ML IV SOLN
INTRAVENOUS | Status: DC | PRN
  Administered 2013-05-17: 50 mg via INTRAVENOUS

## 2013-05-17 MED ORDER — ONDANSETRON HCL 4 MG/2ML IJ SOLN: 4.0000 mg | Freq: Four times a day (QID) | INTRAMUSCULAR | Status: DC | PRN

## 2013-05-17 MED ORDER — ATENOLOL 25 MG PO TABS
25.0000 mg | ORAL_TABLET | Freq: Every morning | ORAL | Status: DC
  Administered 2013-05-18 – 2013-05-19 (×2): 25 mg via ORAL
  Filled 2013-05-17 (×4): qty 1

## 2013-05-17 MED ORDER — ONDANSETRON HCL 4 MG PO TABS: 4.0000 mg | ORAL_TABLET | Freq: Four times a day (QID) | ORAL | Status: DC | PRN

## 2013-05-17 MED ORDER — KETOROLAC TROMETHAMINE 30 MG/ML IJ SOLN
15.0000 mg | Freq: Once | INTRAMUSCULAR | Status: AC | PRN
  Administered 2013-05-17: 30 mg via INTRAVENOUS

## 2013-05-17 MED ORDER — LATANOPROST 0.005 % OP SOLN
1.0000 [drp] | Freq: Every day | OPHTHALMIC | Status: DC
  Administered 2013-05-17 – 2013-05-18 (×2): 1 [drp] via OPHTHALMIC
  Filled 2013-05-17: qty 2.5

## 2013-05-17 MED ORDER — LACTATED RINGERS IV SOLN: INTRAVENOUS | Status: DC

## 2013-05-17 MED ORDER — CEFAZOLIN SODIUM-DEXTROSE 2-3 GM-% IV SOLR
INTRAVENOUS | Status: AC
  Filled 2013-05-17: qty 50

## 2013-05-17 MED ORDER — FENTANYL CITRATE 0.05 MG/ML IJ SOLN
INTRAMUSCULAR | Status: DC | PRN
  Administered 2013-05-17 (×5): 50 ug via INTRAVENOUS

## 2013-05-17 MED ORDER — METOCLOPRAMIDE HCL 5 MG PO TABS
5.0000 mg | ORAL_TABLET | Freq: Three times a day (TID) | ORAL | Status: DC | PRN
  Filled 2013-05-17: qty 2

## 2013-05-17 MED ORDER — LACTATED RINGERS IV SOLN
INTRAVENOUS | Status: DC
  Administered 2013-05-17: 1000 mL via INTRAVENOUS
  Administered 2013-05-17: 17:00:00 via INTRAVENOUS

## 2013-05-17 MED ORDER — PROPOFOL 10 MG/ML IV BOLUS
INTRAVENOUS | Status: DC | PRN
  Administered 2013-05-17: 70 mg via INTRAVENOUS
  Administered 2013-05-17: 130 mg via INTRAVENOUS

## 2013-05-17 MED ORDER — ACETAMINOPHEN 325 MG PO TABS: 650.0000 mg | ORAL_TABLET | Freq: Four times a day (QID) | ORAL | Status: DC | PRN

## 2013-05-17 MED ORDER — SODIUM CHLORIDE 0.9 % IR SOLN
Status: DC | PRN
  Administered 2013-05-17: 7000 mL

## 2013-05-17 SURGICAL SUPPLY — 45 items
BAG SPEC THK2 15X12 ZIP CLS (MISCELLANEOUS) ×1
BAG ZIPLOCK 12X15 (MISCELLANEOUS) ×2 IMPLANT
BANDAGE ELASTIC 6 VELCRO ST LF (GAUZE/BANDAGES/DRESSINGS) ×2 IMPLANT
BANDAGE ESMARK 6X9 LF (GAUZE/BANDAGES/DRESSINGS) ×1 IMPLANT
BNDG CMPR 9X6 STRL LF SNTH (GAUZE/BANDAGES/DRESSINGS) ×1
BNDG ESMARK 6X9 LF (GAUZE/BANDAGES/DRESSINGS) ×2
CLOTH BEACON ORANGE TIMEOUT ST (SAFETY) ×2 IMPLANT
CONT SPECI 4OZ STER CLIK (MISCELLANEOUS) ×2 IMPLANT
CUFF TOURN SGL QUICK 34 (TOURNIQUET CUFF) ×2
CUFF TRNQT CYL 34X4X40X1 (TOURNIQUET CUFF) ×1 IMPLANT
DRAPE EXTREMITY T 121X128X90 (DRAPE) ×2 IMPLANT
DRAPE POUCH INSTRU U-SHP 10X18 (DRAPES) ×2 IMPLANT
DRAPE U-SHAPE 47X51 STRL (DRAPES) ×2 IMPLANT
DRSG ADAPTIC 3X8 NADH LF (GAUZE/BANDAGES/DRESSINGS) ×2 IMPLANT
DRSG PAD ABDOMINAL 8X10 ST (GAUZE/BANDAGES/DRESSINGS) ×2 IMPLANT
DURAPREP 26ML APPLICATOR (WOUND CARE) ×2 IMPLANT
ELECT REM PT RETURN 9FT ADLT (ELECTROSURGICAL) ×2
ELECTRODE REM PT RTRN 9FT ADLT (ELECTROSURGICAL) ×1 IMPLANT
EVACUATOR 1/8 PVC DRAIN (DRAIN) ×2 IMPLANT
GLOVE BIO SURGEON STRL SZ7.5 (GLOVE) ×2 IMPLANT
GLOVE BIO SURGEON STRL SZ8 (GLOVE) ×4 IMPLANT
GLOVE BIOGEL PI IND STRL 8 (GLOVE) ×2 IMPLANT
GLOVE BIOGEL PI INDICATOR 8 (GLOVE) ×2
GLOVE ECLIPSE 8.0 STRL XLNG CF (GLOVE) IMPLANT
GOWN PREVENTION PLUS LG XLONG (DISPOSABLE) ×2 IMPLANT
GOWN STRL REIN XL XLG (GOWN DISPOSABLE) ×4 IMPLANT
HANDPIECE INTERPULSE COAX TIP (DISPOSABLE) ×2
KIT BASIN OR (CUSTOM PROCEDURE TRAY) ×2 IMPLANT
KIT STIMULAN RAPID CURE  10CC (Orthopedic Implant) ×1 IMPLANT
KIT STIMULAN RAPID CURE 10CC (Orthopedic Implant) IMPLANT
MANIFOLD NEPTUNE II (INSTRUMENTS) ×2 IMPLANT
PACK TOTAL JOINT (CUSTOM PROCEDURE TRAY) ×2 IMPLANT
PADDING CAST COTTON 6X4 STRL (CAST SUPPLIES) ×5 IMPLANT
POSITIONER SURGICAL ARM (MISCELLANEOUS) ×2 IMPLANT
SET HNDPC FAN SPRY TIP SCT (DISPOSABLE) ×1 IMPLANT
SPONGE GAUZE 4X4 12PLY (GAUZE/BANDAGES/DRESSINGS) ×2 IMPLANT
STAPLER VISISTAT 35W (STAPLE) ×2 IMPLANT
STRIP CLOSURE SKIN 1/2X4 (GAUZE/BANDAGES/DRESSINGS) ×4 IMPLANT
SUT MNCRL AB 4-0 PS2 18 (SUTURE) ×1 IMPLANT
SUT VIC AB 2-0 CT1 27 (SUTURE) ×6
SUT VIC AB 2-0 CT1 TAPERPNT 27 (SUTURE) ×3 IMPLANT
SUT VLOC 180 0 24IN GS25 (SUTURE) ×1 IMPLANT
SWAB COLLECTION DEVICE MRSA (MISCELLANEOUS) ×2 IMPLANT
TOWEL OR 17X26 10 PK STRL BLUE (TOWEL DISPOSABLE) ×4 IMPLANT
TUBE ANAEROBIC SPECIMEN COL (MISCELLANEOUS) ×2 IMPLANT

## 2013-05-17 NOTE — Transfer of Care (Signed)
Immediate Anesthesia Transfer of Care Note  Patient: [name]  Procedure(s) Performed: Procedure(s) with comments: IRRIGATION AND DEBRIDEMENT RIGHT KNEE (Right) - STIMULIN BEADS  Patient Location: PACU  Anesthesia Type:General  Level of Consciousness: awake, alert  and patient cooperative  Airway & Oxygen Therapy: Patient Spontanous Breathing and Patient connected to face mask oxygen  Post-op Assessment: Report given to PACU RN, Post -op Vital signs reviewed and stable and Patient moving all extremities X 4  Post vital signs: stable  Complications: No apparent anesthesia complications

## 2013-05-17 NOTE — H&P (View-Only) (Signed)
KNEE ADMISSION H&P  Patient is being admitted for right irrigation and debridement of the unicompartmental knee arthroplasty.  Subjective:  Chief Complaint:right knee pain.  HPI: Roger Cameron, 79 y.o. male, has a history of pain and functional disability in the right knee(s) due to increased pain and swelling and patient has failed non-surgical conservative treatments for greater than 12 weeks to include and surgical I&D of the knee. The indications for the revision of the total knee arthroplasty are increased WBC and an elevated CRP.Roger Cameron Onset of symptoms was gradual  with gradually worsening course since that time.  Prior procedures on the right knee(s) include unicompartmental arthroplasty.  Patient currently rates pain in the right knee(s) is moderate with activity with increasing swelling. There is worsening of pain with activity and weight bearing. This condition presents safety issues increasing the risk of falls. This patient has had previous I&D procedure and a concern for possible active infection.  Patient Active Problem List   Diagnosis Date Noted  . Impaired hearing 05/17/2013  . Generalized anxiety disorder 05/17/2013  . Glaucoma 05/17/2013  . GERD (gastroesophageal reflux disease) 05/17/2013  . Unspecified essential hypertension 05/17/2013  . Hyponatremia 02/04/2013  . Anemia 02/04/2013  . Right knee skin infection 02/01/2013  . OA (osteoarthritis) of knee 01/03/2013   Past Medical History  Diagnosis Date  . Impaired hearing     NO HEARING AIDS  . Anxiety   . Glaucoma     BILATERAL  . Cataract     LEFT EYE  . Hypertension   . GERD (gastroesophageal reflux disease)   . Arthritis   . Infection 02/01/13    RIGHT KNEE  S/P RT UNIKNEE REPLACEMENT ON 01/03/13 - IS TO HAVE SURGERY 7/16 I&D    Past Surgical History  Procedure Laterality Date  . Right knee arthroscopy  x 2    . Hernia repair      RIGHT INGUINAL HERNAI REPAIR  . Tonsillectomy      AS A CHILD  . Eye  surgery      RIGHT EYE CATARACT REMOVED  . Partial knee arthroplasty Right 01/03/2013    Procedure: RIGHT KNEE MEDIAL COMPARTMENT UNI COMPARMENTAL ARTHROPLASTY;  Surgeon: Loanne Drilling, MD;  Location: [facility];  Service: Orthopedics;  Laterality: Right;  . Irrigation and debridement knee Right 02/02/2013    Procedure: IRRIGATION AND DEBRIDEMENT KNEE;  Surgeon: Loanne Drilling, MD;  Location: [facility];  Service: Orthopedics;  Laterality: Right;     (Not in a hospital admission) Allergies  Allergen Reactions  . Adhesive [Tape] Rash    blisters    History  Substance Use Topics  . Smoking status: Never Smoker   . Smokeless tobacco: Never Used  . Alcohol Use: Yes     Comment: RARE BEER    Family History Mother - cancer Sister - heart disease   Review of Systems  Constitutional: Negative.   HENT: Negative.   Eyes: Negative.   Respiratory: Negative.   Genitourinary: Negative.   Musculoskeletal: Positive for joint pain (with increase swelling).  Neurological: Negative.      Objective:  Physical Exam  Constitutional: He is oriented to person, place, and time. He appears well-developed and well-nourished. No distress.  HENT:  Head: Normocephalic and atraumatic.  Upper and lower dentures  Eyes: EOM are normal. Pupils are equal, round, and reactive to light.  Neck: Neck supple.  Cardiovascular: Normal rate and regular rhythm.   Murmur (best heard over the aortic area, II/VI, systolic  murmur) heard. Respiratory: Breath sounds normal.  GI: Soft. Bowel sounds are normal.  Musculoskeletal:       Right knee: He exhibits decreased range of motion and swelling. Tenderness found.       Legs: Neurological: He is alert and oriented to person, place, and time.    Vital signs in last 24 hours: @VSRANGES @  Labs:  Estimated body mass index is 25.63 kg/(m^2) as calculated from the following:   Height as of 05/16/13: 6' (1.829 m).   Weight as of 05/16/13: 85.73 kg (189 lb).  Imaging  Review RADIOGRAPHS: (taken on 04/12/2013 AP and lateral views of the right knee show the prosthesis in excellent position. No periprosthetic abnormalities.  Assessment/Plan: right knee unicompartmental arthroplasty.  Recurrent right knee effusions Possible infection   The patient history, physical examination, clinical judgment of the provider and imaging studies are consistent with concern for infection of the right knee(s), previous unicompartmental knee arthroplasty.  Irrigation and debridement of the right knee  is deemed medically necessary. The treatment options including medical management, oral antibiotic therapy, arthroscopy and I&D of the arthroplasty were discussed at length. The risks and benefits of the I&D right knee arthroplasty were presented and reviewed. The risks due to aseptic loosening, infection, stiffness, patella tracking problems, thromboembolic complications and other imponderables were discussed. The patient acknowledged the explanation, agreed to proceed with the plan and consent was signed. Patient is being admitted for inpatient treatment for surgery, pain control, PT, OT, post operative antibiotics, VTE prophylaxis, progressive ambulation and ADL's and discharge planning.The patient is planning to be discharged home with home health services and possible IV antibiotics if deemed necessary.

## 2013-05-17 NOTE — Anesthesia Preprocedure Evaluation (Signed)
Anesthesia Evaluation  Patient identified by MRN, date of birth, ID band Patient awake    Reviewed: Allergy & Precautions, H&P , NPO status , Patient's Chart, lab work & pertinent test results  Airway Mallampati: II TM Distance: >3 FB Neck ROM: Full    Dental no notable dental hx.    Pulmonary neg pulmonary ROS,  breath sounds clear to auscultation  Pulmonary exam normal       Cardiovascular hypertension, Pt. on medications Rhythm:Regular Rate:Normal     Neuro/Psych negative neurological ROS  negative psych ROS   GI/Hepatic negative GI ROS, Neg liver ROS,   Endo/Other  negative endocrine ROS  Renal/GU negative Renal ROS  negative genitourinary   Musculoskeletal negative musculoskeletal ROS (+)   Abdominal   Peds negative pediatric ROS (+)  Hematology negative hematology ROS (+)   Anesthesia Other Findings   Reproductive/Obstetrics negative OB ROS                           Anesthesia Physical Anesthesia Plan  ASA: II  Anesthesia Plan: General   Post-op Pain Management:    Induction: Intravenous  Airway Management Planned: LMA  Additional Equipment:   Intra-op Plan:   Post-operative Plan:   Informed Consent: I have reviewed the patients History and Physical, chart, labs and discussed the procedure including the risks, benefits and alternatives for the proposed anesthesia with the patient or authorized representative who has indicated his/her understanding and acceptance.   Dental advisory given  Plan Discussed with: CRNA and Surgeon  Anesthesia Plan Comments:         Anesthesia Quick Evaluation  

## 2013-05-17 NOTE — Brief Op Note (Signed)
05/17/2013  5:04 PM  PATIENT:  Roger Cameron  79 y.o. male  PRE-OPERATIVE DIAGNOSIS:  INFECTED RIGHT KNEE UNICOMPARTAMENTAL REPLACEMENT  POST-OPERATIVE DIAGNOSIS:  INFECTED RIGHT KNEE UNICOMPARTAMENTAL REPLACEMENT  PROCEDURE:  Procedure(s) with comments: IRRIGATION AND DEBRIDEMENT RIGHT KNEE (Right) - STIMULIN BEADS  SURGEON:  Surgeon(s) and Role:    * Loanne Drilling, MD - Primary  PHYSICIAN ASSISTANT:   ASSISTANTS: Avel Peace, PA-C   ANESTHESIA:   general  EBL:  Total I/O In: 1000 [I.V.:1000] Out: 200 [Urine:150; Blood:50]  BLOOD ADMINISTERED:none  DRAINS: (Medium) Hemovact drain(s) in the right knee with  Suction Open   LOCAL MEDICATIONS USED:  NONE  SPECIMEN:  No Specimen  DISPOSITION OF SPECIMEN:  N/A  COUNTS:  YES  TOURNIQUET:   Total Tourniquet Time Documented: Thigh (Right) - 31 minutes Total: Thigh (Right) - 31 minutes   DICTATION: .Other Dictation: Dictation Number [id number]  PLAN OF CARE: Admit to inpatient   PATIENT DISPOSITION:  PACU - hemodynamically stable.

## 2013-05-17 NOTE — Interval H&P Note (Signed)
History and Physical Interval Note:  05/17/2013 3:24 PM  Roger Cameron  has presented today for surgery, with the diagnosis of INFECTED RIGHT KNEE UNICOMPARTAMENTAL REPLACEMENT  The various methods of treatment have been discussed with the patient and family. After consideration of risks, benefits and other options for treatment, the patient has consented to  Procedure(s) with comments: IRRIGATION AND DEBRIDEMENT RIGHT KNEE (Right) - STIMULIN BEADS as a surgical intervention .  The patient's history has been reviewed, patient examined, no change in status, stable for surgery.  I have reviewed the patient's chart and labs.  Questions were answered to the patient's satisfaction.     Loanne Drilling

## 2013-05-17 NOTE — H&P (Signed)
KNEE ADMISSION H&P  Patient is being admitted for right irrigation and debridement of the unicompartmental knee arthroplasty.  Subjective:  Chief Complaint:right knee pain.  HPI: Roger Cameron, 79 y.o. male, has a history of pain and functional disability in the right knee(s) due to increased pain and swelling and patient has failed non-surgical conservative treatments for greater than 12 weeks to include and surgical I&D of the knee. The indications for the revision of the total knee arthroplasty are increased WBC and an elevated CRP.. Onset of symptoms was gradual  with gradually worsening course since that time.  Prior procedures on the right knee(s) include unicompartmental arthroplasty.  Patient currently rates pain in the right knee(s) is moderate with activity with increasing swelling. There is worsening of pain with activity and weight bearing. This condition presents safety issues increasing the risk of falls. This patient has had previous I&D procedure and a concern for possible active infection.  Patient Active Problem List   Diagnosis Date Noted  . Impaired hearing 05/17/2013  . Generalized anxiety disorder 05/17/2013  . Glaucoma 05/17/2013  . GERD (gastroesophageal reflux disease) 05/17/2013  . Unspecified essential hypertension 05/17/2013  . Hyponatremia 02/04/2013  . Anemia 02/04/2013  . Right knee skin infection 02/01/2013  . OA (osteoarthritis) of knee 01/03/2013   Past Medical History  Diagnosis Date  . Impaired hearing     NO HEARING AIDS  . Anxiety   . Glaucoma     BILATERAL  . Cataract     LEFT EYE  . Hypertension   . GERD (gastroesophageal reflux disease)   . Arthritis   . Infection 02/01/13    RIGHT KNEE  S/P RT UNIKNEE REPLACEMENT ON 01/03/13 - IS TO HAVE SURGERY 7/16 I&D    Past Surgical History  Procedure Laterality Date  . Right knee arthroscopy  x 2    . Hernia repair      RIGHT INGUINAL HERNAI REPAIR  . Tonsillectomy      AS A CHILD  . Eye  surgery      RIGHT EYE CATARACT REMOVED  . Partial knee arthroplasty Right 01/03/2013    Procedure: RIGHT KNEE MEDIAL COMPARTMENT UNI COMPARMENTAL ARTHROPLASTY;  Surgeon: Frank V Aluisio, MD;  Location: [facility];  Service: Orthopedics;  Laterality: Right;  . Irrigation and debridement knee Right 02/02/2013    Procedure: IRRIGATION AND DEBRIDEMENT KNEE;  Surgeon: Frank V Aluisio, MD;  Location: [facility];  Service: Orthopedics;  Laterality: Right;     (Not in a hospital admission) Allergies  Allergen Reactions  . Adhesive [Tape] Rash    blisters    History  Substance Use Topics  . Smoking status: Never Smoker   . Smokeless tobacco: Never Used  . Alcohol Use: Yes     Comment: RARE BEER    Family History Mother - cancer Sister - heart disease   Review of Systems  Constitutional: Negative.   HENT: Negative.   Eyes: Negative.   Respiratory: Negative.   Genitourinary: Negative.   Musculoskeletal: Positive for joint pain (with increase swelling).  Neurological: Negative.      Objective:  Physical Exam  Constitutional: He is oriented to person, place, and time. He appears well-developed and well-nourished. No distress.  HENT:  Head: Normocephalic and atraumatic.  Upper and lower dentures  Eyes: EOM are normal. Pupils are equal, round, and reactive to light.  Neck: Neck supple.  Cardiovascular: Normal rate and regular rhythm.   Murmur (best heard over the aortic area, II/VI, systolic   murmur) heard. Respiratory: Breath sounds normal.  GI: Soft. Bowel sounds are normal.  Musculoskeletal:       Right knee: He exhibits decreased range of motion and swelling. Tenderness found.       Legs: Neurological: He is alert and oriented to person, place, and time.    Vital signs in last 24 hours: @VSRANGES@  Labs:  Estimated body mass index is 25.63 kg/(m^2) as calculated from the following:   Height as of 05/16/13: 6' (1.829 m).   Weight as of 05/16/13: 85.73 kg (189 lb).  Imaging  Review RADIOGRAPHS: (taken on 04/12/2013 AP and lateral views of the right knee show the prosthesis in excellent position. No periprosthetic abnormalities.  Assessment/Plan: right knee unicompartmental arthroplasty.  Recurrent right knee effusions Possible infection   The patient history, physical examination, clinical judgment of the provider and imaging studies are consistent with concern for infection of the right knee(s), previous unicompartmental knee arthroplasty.  Irrigation and debridement of the right knee  is deemed medically necessary. The treatment options including medical management, oral antibiotic therapy, arthroscopy and I&D of the arthroplasty were discussed at length. The risks and benefits of the I&D right knee arthroplasty were presented and reviewed. The risks due to aseptic loosening, infection, stiffness, patella tracking problems, thromboembolic complications and other imponderables were discussed. The patient acknowledged the explanation, agreed to proceed with the plan and consent was signed. Patient is being admitted for inpatient treatment for surgery, pain control, PT, OT, post operative antibiotics, VTE prophylaxis, progressive ambulation and ADL's and discharge planning.The patient is planning to be discharged home with home health services and possible IV antibiotics if deemed necessary.   

## 2013-05-18 ENCOUNTER — Encounter (HOSPITAL_COMMUNITY): Payer: Self-pay | Admitting: Orthopedic Surgery

## 2013-05-18 LAB — BASIC METABOLIC PANEL
BUN: 18 mg/dL (ref 6–23)
Chloride: 100 mEq/L (ref 96–112)
Creatinine, Ser: 0.81 mg/dL (ref 0.50–1.35)
GFR calc Af Amer: >90 mL/min (ref >90)
GFR calc non Af Amer: 81 mL/min — ABNORMAL LOW (ref >90)
Glucose, Bld: 93 mg/dL (ref 70–99)
Potassium: 4.2 mEq/L (ref 3.5–5.1)

## 2013-05-18 LAB — CBC
HCT: 32.7 % — ABNORMAL LOW (ref 39.0–52.0)
Hemoglobin: 11.2 g/dL — ABNORMAL LOW (ref 13.0–17.0)
MCH: 28.8 pg (ref 26.0–34.0)
MCHC: 34.3 g/dL (ref 30.0–36.0)
MCV: 84.1 fL (ref 78.0–100.0)
RDW: 13.2 % (ref 11.5–15.5)
WBC: 7.2 10*3/uL (ref 4.0–10.5)

## 2013-05-18 NOTE — Care Management Note (Signed)
    Page 1 of 1   05/18/2013     10:58:32 AM   CARE MANAGEMENT NOTE 05/18/2013  Patient:  Roger Cameron, Roger Cameron   Account Number:  [id number]  Date Initiated:  05/18/2013  Documentation initiated by:  Lorenda Ishihara  Subjective/Objective Assessment:   79 yo male admitted s/p I&D of knee. PTA lived at home alone.     Action/Plan:   Home when stable   Anticipated DC Date:  05/21/2013   Anticipated DC Plan:  HOME W HOME HEALTH SERVICES      DC Planning Services  CM consult      Choice offered to / List presented to:             [facility] agency  [facility]   Status of service:  Completed, signed off Medicare Important Message given?   (If response is "NO", the following Medicare IM given date fields will be blank) Date Medicare IM given:   Date Additional Medicare IM given:    Discharge Disposition:  HOME W HOME HEALTH SERVICES  Per UR Regulation:  Reviewed for med. necessity/level of care/duration of stay  If discussed at Long Length of Stay Meetings, dates discussed:    Comments:  05-18-13 Lorenda Ishihara RN CM 1000 Spoke with patient at bedside. Unsure if he will need HH yet, awaiting PT evals and wants to speak to surgeon first. Patient lives at home alone but is very active and independent. Daughter is in the room and states she is available to assist as needed. Patient wants to use [facility] for [facility] if needed, has used them in the past. Contacted Debbie with [facility] to make her aware.

## 2013-05-18 NOTE — Op Note (Signed)
NAME:  Roger Cameron, Roger Cameron NO.:  [id number]  MEDICAL RECORD NO.:  [id number]  LOCATION:  1536                         FACILITY:  [facility]  PHYSICIAN:  [name], M.D.    DATE OF BIRTH:  Dec 02, 1928  DATE OF PROCEDURE:  05/17/2013 DATE OF DISCHARGE:                              OPERATIVE REPORT   PREOPERATIVE DIAGNOSIS:  Infected right knee unicompartment replacement.  POSTOPERATIVE DIAGNOSIS:  Infected right knee unicompartment replacement.  PROCEDURE:  Irrigation and debridement, right knee.  SURGEON:  [name], MD  ASSISTANT:  Alexzandrew L. Perkins, PA-C  ANESTHESIA:  General.  ESTIMATED BLOOD LOSS:  Minimal.  DRAINS:  Hemovac x1.  TOURNIQUET TIME:  30 minutes at 300 mmHg.  COMPLICATIONS:  None.  CONDITION:  Stable to recovery.  BRIEF CLINICAL NOTE:  Roger Cameron is an 79 year old male who has had problems with his right knee since undergoing unicompartment replacement in June of this year.  Initially, he had wound healing problems requiring an irrigation and debridement.  For the past 3-4 weeks, he has had swelling.  Aspirations have been negative.  Due to persistent swelling, lab work has also been performed which did show elevated white count, C-reactive protein and sed rate.  I felt that he most likely had an undetected deeper infection and thus presents now for irrigation and debridement.  PROCEDURE IN DETAIL:  After successful administration of general anesthetic, a tourniquet was placed high on his right thigh.  Right lower extremity was prepped and draped in the usual sterile fashion. Extremity was wrapped in Esmarch, knee flexed, and tourniquet inflated to 300 mmHg.  A midline incision was made with a 10 blade through subcutaneous tissue to the extensor mechanism.  Fresh blade was used to make a medial arthrotomy.  He had a lot of fibrinous tissue in the joint and a very small amount of fluid.  That fluid was sent for Gram  stain, culture and sensitivity.  We used a fresh knife to elevate the tissue on the proximal medial tibia to reveal the medial unicompartmental replacement.  It is in good position and well fixed.  I placed through range of motion, no signs of loosening.  I did a thorough synovectomy of the entire joint.  I also removed scar tissue from under the extensor mechanism.  We then thoroughly irrigated the joint with 3 liters of saline.  I then placed about 500 mL of Betadine into the joint and let it rest there for about 5 minutes.  We then irrigated again with another 3 liters of saline using pulsatile lavage.  The cartilage in the trochlea, patella and lateral compartment remained normal looking. There was no evidence of any cartilage damage or erosion.  Then, we placed 10 g stimulant beads and mixed with a gram of vancomycin.  I placed in the suprapatellar region in the medial and lateral gutters. Hemovac drain was placed.  The arthrotomy was closed with a running #1 Quill suture.  Flexion against gravity was 135 degrees and the patella tracks normally.  The tourniquet was then released at a total time of 30 minutes.  The drain was also placed in the subcu and the subcu closed  with interrupted 2-0 Vicryl.  The drains were hooked to suction.  The skin was closed with staples.  The incision was cleaned and dried and a bulky sterile dressing applied.  He was placed into a knee immobilizer, awakened and transported to recovery in stable condition.  Please note that a surgical assistant was a medical necessity in this procedure in order to perform safe retraction of ligaments and vital neurovascular structures to prevent damage to them while doing a thorough synovectomy.  Assistance was also necessary for the wound closure.     [name], M.D.     FA/MEDQ  D:  05/17/2013  T:  05/18/2013  Job:  [id number]

## 2013-05-18 NOTE — Evaluation (Signed)
Physical Therapy Evaluation Patient Details Name: Roger Cameron MRN: [id number] DOB: 08-10-1928 Today's Date: 05/18/2013 Time: 4098-1191 PT Time Calculation (min): 37 min  PT Assessment / Plan / Recommendation History of Present Illness     Clinical Impression  Pt s/p I&D of R knee replacement presents with decreased R LE strength/ROM and post op pain limiting functional mobility.  Pt should progress to d/c home with family assist and would benefit from follow up HHPT    PT Assessment  Patient needs continued PT services    Follow Up Recommendations  Home health PT    Does the patient have the potential to tolerate intense rehabilitation      Barriers to Discharge        Equipment Recommendations  None recommended by PT    Recommendations for Other Services OT consult   Frequency 7X/week    Precautions / Restrictions Precautions Precautions: Knee;Fall Restrictions Weight Bearing Restrictions: No Other Position/Activity Restrictions: WBAT   Pertinent Vitals/Pain 6/10; premed, ice packs provided      Mobility  Bed Mobility Bed Mobility: Supine to Sit Supine to Sit: 4: Min assist;With rails Details for Bed Mobility Assistance: cues for sequence and use of L LE to self assist Transfers Transfers: Sit to Stand;Stand to Sit Sit to Stand: 4: Min assist;From bed Stand to Sit: 4: Min assist;To chair/3-in-1 Details for Transfer Assistance: cues for LE management and use of UEs to self assist Ambulation/Gait Ambulation/Gait Assistance: 4: Min assist Ambulation Distance (Feet): 75 Feet Assistive device: Rolling walker Ambulation/Gait Assistance Details: cues for sequence, posture and position from RW Gait Pattern: Step-to pattern;Shuffle;Antalgic;Decreased step length - right;Decreased step length - left General Gait Details: ltd WB tolerated 2* pain Stairs: No    Exercises General Exercises - Lower Extremity Ankle Circles/Pumps: AROM;Both;10 reps;Supine Quad Sets:  AROM;Both;10 reps;Supine Heel Slides: AAROM;5 reps;Left;Supine Straight Leg Raises: AAROM;10 reps;Left;Supine   PT Diagnosis: Difficulty walking  PT Problem List: Decreased strength;Decreased range of motion;Decreased activity tolerance;Decreased mobility;Decreased knowledge of use of DME;Pain PT Treatment Interventions: DME instruction;Gait training;Functional mobility training;Therapeutic activities;Therapeutic exercise;Patient/family education     PT Goals(Current goals can be found in the care plan section) Acute Rehab PT Goals Patient Stated Goal: Resume previous active lifestyle with decreased pain PT Goal Formulation: With patient Time For Goal Achievement: 05/25/13 Potential to Achieve Goals: Good  Visit Information  Last PT Received On: 05/18/13 Assistance Needed: +1       Prior Functioning  Home Living Family/patient expects to be discharged to:: Private residence Living Arrangements: Alone Available Help at Discharge: Family Type of Home: House Home Access: Level entry Home Layout: One level Home Equipment: Environmental consultant - 2 wheels;Bedside commode Prior Function Level of Independence: Independent with assistive device(s);Independent Communication Communication: HOH    Cognition  Cognition Arousal/Alertness: Awake/alert Behavior During Therapy: WFL for tasks assessed/performed Overall Cognitive Status: Within Functional Limits for tasks assessed    Extremity/Trunk Assessment Upper Extremity Assessment Upper Extremity Assessment: Overall WFL for tasks assessed Lower Extremity Assessment Lower Extremity Assessment: RLE deficits/detail RLE Deficits / Details: 2+/5 quads with AAROM to -10 ext and min flex at knee - pain limited   Balance    End of Session PT - End of Session Equipment Utilized During Treatment: Gait belt Activity Tolerance: Patient tolerated treatment well Patient left: in chair;with call bell/phone within reach;with family/visitor present Nurse  Communication: Mobility status  GP     [name] 05/18/2013, 1:01 PM

## 2013-05-18 NOTE — Progress Notes (Signed)
Subjective: 1 Day Post-Op Procedure(s) (LRB): IRRIGATION AND DEBRIDEMENT RIGHT KNEE (Right) Patient reports pain as mild.   Patient seen in rounds with Dr. Lequita Halt. Patient is well, but has had some minor complaints of pain in the knee, requiring pain medications We will start therapy today.  Plan is to go Home after hospital stay.  Objective: Vital signs in last 24 hours: Temp:  [96.6 F (35.9 C)-98.4 F (36.9 C)] 97.4 F (36.3 C) (10/29 0540) Pulse Rate:  [53-65] 58 (10/29 0540) Resp:  [14-22] 20 (10/29 1200) BP: (110-168)/(58-78) 139/68 mmHg (10/29 0540) SpO2:  [97 %-100 %] 100 % (10/29 1200) Weight:  [85.73 kg (189 lb)] 85.73 kg (189 lb) (10/28 1829)  Intake/Output from previous day: 10/28 0701 - 10/29 0700 In: 3820 [P.O.:840; I.V.:2825; IV Piggyback:155] Out: 1075 [Urine:1025; Blood:50] Intake/Output this shift: Total I/O In: 570 [P.O.:120; I.V.:400; IV Piggyback:50] Out: 150 [Urine:150]   Recent Labs  05/16/13 1200 05/18/13 0505  HGB 13.0 11.2*    Recent Labs  05/16/13 1200 05/18/13 0505  WBC 10.6* 7.2  RBC 4.49 3.89*  HCT 37.9* 32.7*  PLT 322 288    Recent Labs  05/17/13 1225 05/18/13 0505  NA 133* 133*  K 4.0 4.2  CL 100 100  CO2 24 25  BUN 17 18  CREATININE 0.85 0.81  GLUCOSE 101* 93  CALCIUM 10.0 9.1    Recent Labs  05/17/13 1225  INR 1.21   Cultures ae no growth so far.  EXAM General - Patient is Alert, Appropriate and Oriented Extremity - Neurovascular intact Sensation intact distally Dorsiflexion/Plantar flexion intact Dressing - dressing C/D/I Motor Function - intact, moving foot and toes well on exam.  Hemovac left in place.  Past Medical History  Diagnosis Date  . Impaired hearing     NO HEARING AIDS  . Anxiety   . Glaucoma     BILATERAL  . Cataract     LEFT EYE  . Hypertension   . GERD (gastroesophageal reflux disease)   . Arthritis   . Infection 02/01/13    RIGHT KNEE  S/P RT UNIKNEE REPLACEMENT ON 01/03/13 -  IS TO HAVE SURGERY 7/16 I&D    Assessment/Plan: 1 Day Post-Op Procedure(s) (LRB): IRRIGATION AND DEBRIDEMENT RIGHT KNEE (Right) Active Problems:   * No active hospital problems. *  Estimated body mass index is 25.63 kg/(m^2) as calculated from the following:   Height as of this encounter: 6' (1.829 m).   Weight as of this encounter: 85.73 kg (189 lb). Up with therapy Plan for discharge tomorrow Discharge home with home health  DVT Prophylaxis - Aspirin Weight-Bearing as tolerated to right leg D/C O2 and Pulse OX and try on Room Air  Roger Cameron 05/18/2013, 1:15 PM

## 2013-05-18 NOTE — Evaluation (Signed)
Occupational Therapy Evaluation Patient Details Name: Roger Cameron MRN: [id number] DOB: 11-04-1928 Today's Date: 05/18/2013 Time: 6644-0347 OT Time Calculation (min): 25 min  OT Assessment / Plan / Recommendation History of present illness s/p I & D of R knee due to infection of TKA.   Clinical Impression   Pt has been dealing with knee pain and limited mobility since 2012.  Requiring some assist for safety with mobility, but expect that will improve for pt to return home with assist of his daughter.  All equipment needs are met and education completed    OT Assessment  Patient does not need any further OT services    Follow Up Recommendations  No OT follow up    Barriers to Discharge      Equipment Recommendations  None recommended by OT    Recommendations for Other Services    Frequency       Precautions / Restrictions Precautions Precautions: Knee;Fall Restrictions Weight Bearing Restrictions: No Other Position/Activity Restrictions: WBAT   Pertinent Vitals/Pain R Knee soreness, repositioned    ADL  Eating/Feeding: Independent Where Assessed - Eating/Feeding: Edge of bed Grooming: Min guard Where Assessed - Grooming: Unsupported standing Upper Body Bathing: Set up Where Assessed - Upper Body Bathing: Unsupported sitting Lower Body Bathing: Min guard Where Assessed - Lower Body Bathing: Unsupported sitting;Supported sit to stand Upper Body Dressing: Set up Where Assessed - Upper Body Dressing: Unsupported sitting Lower Body Dressing: Min guard Where Assessed - Lower Body Dressing: Unsupported sitting;Supported sit to stand Toilet Transfer: Min Musician Method: Sit to Barista:  (to and from recliner) Toileting - Architect and Hygiene: Independent Where Assessed - Engineer, mining and Hygiene: Sit on 3-in-1 or toilet Equipment Used: Rolling walker Transfers/Ambulation Related to ADLs:  supervision with RW for safety ADL Comments: Pt is able to reach down to donn R sock with ease.  Plans to stand to shower.      OT Diagnosis:    OT Problem List:   OT Treatment Interventions:     OT Goals(Current goals can be found in the care plan section) Acute Rehab OT Goals Patient Stated Goal: Resume previous active lifestyle with decreased pain  Visit Information  Last OT Received On: 05/18/13 Assistance Needed: +1 History of Present Illness: s/p I & D of R knee due to infection of TKA.       Prior Functioning     Home Living Family/patient expects to be discharged to:: Private residence Living Arrangements: Alone Available Help at Discharge: Family;Neighbor Type of Home: House Home Access: Level entry Home Layout: One level Home Equipment: Walker - 2 wheels;Bedside commode;Grab bars - toilet;Shower seat (does not use shower seat, keeps 3 in1 over toilet) Prior Function Level of Independence: Independent with assistive device(s);Independent Communication Communication: HOH Dominant Hand: Right         Vision/Perception Vision - History Baseline Vision: Wears glasses all the time   Cognition  Cognition Arousal/Alertness: Awake/alert Behavior During Therapy: WFL for tasks assessed/performed Overall Cognitive Status: Within Functional Limits for tasks assessed    Extremity/Trunk Assessment Upper Extremity Assessment Upper Extremity Assessment: Overall WFL for tasks assessed Lower Extremity Assessment Lower Extremity Assessment: Defer to PT evaluation RLE Deficits / Details:      Mobility Bed Mobility Bed Mobility: Supine to Sit;Sit to Supine Supine to Sit: HOB elevated;4: Min guard Sit to Supine: HOB elevated;4: Min guard Details for Bed Mobility Assistance: cues for sequence and use of L LE  to self assist Transfers Transfers: Sit to Stand;Stand to Sit Sit to Stand: 4: Min guard;With upper extremity assist Stand to Sit: 4: Min assist;To  chair/3-in-1 Details for Transfer Assistance: cues for LE management and use of UEs to self assist     Exercise   Balance     End of Session OT - End of Session Activity Tolerance: Patient tolerated treatment well Patient left: in bed;with call bell/phone within reach;with family/visitor present  GO     Evern Bio 05/18/2013, 3:05 PM

## 2013-05-18 NOTE — Progress Notes (Signed)
Physical Therapy Treatment Patient Details Name: Roger Cameron MRN: [id number] DOB: March 08, 1929 Today's Date: 05/18/2013 Time: 9811-9147 PT Time Calculation (min): 21 min  PT Assessment / Plan / Recommendation  History of Present Illness s/p I & D of R knee due to infection of TKA.   PT Comments     Follow Up Recommendations  Home health PT     Does the patient have the potential to tolerate intense rehabilitation     Barriers to Discharge        Equipment Recommendations  None recommended by PT    Recommendations for Other Services OT consult  Frequency 7X/week   Progress towards PT Goals Progress towards PT goals: Progressing toward goals  Plan Current plan remains appropriate    Precautions / Restrictions Precautions Precautions: Knee;Fall Restrictions Weight Bearing Restrictions: No Other Position/Activity Restrictions: WBAT   Pertinent Vitals/Pain 4/10; premed, ice packs provided    Mobility  Bed Mobility Bed Mobility: Supine to Sit;Sit to Supine Supine to Sit: 4: Min guard Sit to Supine: 5: Supervision Details for Bed Mobility Assistance: cues for sequence and use of L LE to self assist Transfers Transfers: Sit to Stand;Stand to Sit Sit to Stand: 4: Min guard;With upper extremity assist Stand to Sit: 4: Min guard Details for Transfer Assistance: cues for LE management and use of UEs to self assist Ambulation/Gait Ambulation/Gait Assistance: 4: Min assist;4: Min guard Ambulation Distance (Feet): 200 Feet Assistive device: Rolling walker Ambulation/Gait Assistance Details: cues for pacing, posture, sequence and position from RW Gait Pattern: Step-to pattern;Shuffle;Decreased step length - right;Decreased step length - left;Step-through pattern General Gait Details: ltd WB tolerated 2* pain Stairs: No    Exercises     PT Diagnosis:    PT Problem List:   PT Treatment Interventions:     PT Goals (current goals can now be found in the care plan  section) Acute Rehab PT Goals Patient Stated Goal: Resume previous active lifestyle with decreased pain PT Goal Formulation: With patient Time For Goal Achievement: 05/25/13 Potential to Achieve Goals: Good  Visit Information  Last PT Received On: 05/18/13 Assistance Needed: +1 History of Present Illness: s/p I & D of R knee due to infection of TKA.    Subjective Data  Subjective: I'm doing better than this morning Patient Stated Goal: Resume previous active lifestyle with decreased pain   Cognition  Cognition Arousal/Alertness: Awake/alert Behavior During Therapy: WFL for tasks assessed/performed Overall Cognitive Status: Within Functional Limits for tasks assessed    Balance     End of Session PT - End of Session Equipment Utilized During Treatment: Gait belt Activity Tolerance: Patient tolerated treatment well Patient left: in bed;with call bell/phone within reach;with family/visitor present Nurse Communication: Mobility status   GP     Kiersten Coss 05/18/2013, 5:33 PM

## 2013-05-19 LAB — BASIC METABOLIC PANEL
BUN: 10 mg/dL (ref 6–23)
CO2: 23 mEq/L (ref 19–32)
Calcium: 9.7 mg/dL (ref 8.4–10.5)
Creatinine, Ser: 0.72 mg/dL (ref 0.50–1.35)
GFR calc Af Amer: >90 mL/min (ref >90)
GFR calc non Af Amer: 85 mL/min — ABNORMAL LOW (ref >90)
Glucose, Bld: 93 mg/dL (ref 70–99)

## 2013-05-19 LAB — CBC
HCT: 33.6 % — ABNORMAL LOW (ref 39.0–52.0)
Hemoglobin: 11.5 g/dL — ABNORMAL LOW (ref 13.0–17.0)
MCH: 28.5 pg (ref 26.0–34.0)
MCHC: 34.2 g/dL (ref 30.0–36.0)
MCV: 83.4 fL (ref 78.0–100.0)
RDW: 13.1 % (ref 11.5–15.5)

## 2013-05-19 MED ORDER — TRAMADOL HCL 50 MG PO TABS: 50.0000 mg | ORAL_TABLET | Freq: Four times a day (QID) | ORAL | Status: DC | PRN

## 2013-05-19 MED ORDER — DOXYCYCLINE HYCLATE 50 MG PO CAPS: 100.0000 mg | ORAL_CAPSULE | Freq: Two times a day (BID) | ORAL | Status: DC

## 2013-05-19 MED ORDER — AMOXICILLIN-POT CLAVULANATE 875-125 MG PO TABS: 1.0000 | ORAL_TABLET | Freq: Two times a day (BID) | ORAL | Status: DC

## 2013-05-19 MED ORDER — METHOCARBAMOL 500 MG PO TABS: 500.0000 mg | ORAL_TABLET | Freq: Four times a day (QID) | ORAL | Status: DC | PRN

## 2013-05-19 MED ORDER — ASPIRIN 325 MG PO TBEC: 325.0000 mg | DELAYED_RELEASE_TABLET | Freq: Every day | ORAL | Status: DC

## 2013-05-19 MED ORDER — OXYCODONE HCL 5 MG PO TABS: 5.0000 mg | ORAL_TABLET | ORAL | Status: DC | PRN

## 2013-05-19 NOTE — Progress Notes (Signed)
Physical Therapy Treatment Patient Details Name: TALAL FRITCHMAN MRN: [id number] DOB: 1928/08/29 Today's Date: 05/19/2013 Time: 4010-2725 PT Time Calculation (min): 20 min  PT Assessment / Plan / Recommendation  History of Present Illness s/p I & D of R knee due to infection of TKA.   PT Comments     Follow Up Recommendations        Does the patient have the potential to tolerate intense rehabilitation     Barriers to Discharge        Equipment Recommendations  None recommended by PT    Recommendations for Other Services    Frequency 7X/week   Progress towards PT Goals Progress towards PT goals: Progressing toward goals  Plan Current plan remains appropriate    Precautions / Restrictions Precautions Precautions: Knee;Fall Restrictions Weight Bearing Restrictions: No Other Position/Activity Restrictions: WBAT   Pertinent Vitals/Pain 5/10; premed,     Mobility  Bed Mobility Bed Mobility: Supine to Sit;Sit to Supine Supine to Sit: 5: Supervision Sit to Supine: 5: Supervision Transfers Transfers: Sit to Stand;Stand to Sit Sit to Stand: 5: Supervision Stand to Sit: 5: Supervision Details for Transfer Assistance: cues for LE management and use of UEs to self assist Ambulation/Gait Ambulation/Gait Assistance: 5: Supervision Ambulation Distance (Feet): 400 Feet Assistive device: Rolling walker Ambulation/Gait Assistance Details: min cues for position from RW Gait Pattern: Step-to pattern;Shuffle;Step-through pattern Stairs: No    Exercises General Exercises - Lower Extremity Ankle Circles/Pumps: AROM;Both;10 reps;Supine Quad Sets: AROM;Both;Supine;15 reps Heel Slides: AAROM;Left;Supine;10 reps Straight Leg Raises: AAROM;Left;Supine;AROM;15 reps   PT Diagnosis:    PT Problem List:   PT Treatment Interventions:     PT Goals (current goals can now be found in the care plan section) Acute Rehab PT Goals PT Goal Formulation: With patient Time For Goal  Achievement: 05/25/13 Potential to Achieve Goals: Good  Visit Information  Last PT Received On: 05/19/13 Assistance Needed: +1 History of Present Illness: s/p I & D of R knee due to infection of TKA.    Subjective Data      Cognition  Cognition Arousal/Alertness: Awake/alert Behavior During Therapy: WFL for tasks assessed/performed Overall Cognitive Status: Within Functional Limits for tasks assessed    Balance     End of Session PT - End of Session Activity Tolerance: Patient tolerated treatment well Patient left: in chair;with call bell/phone within reach;with family/visitor present Nurse Communication: Mobility status   GP     Zyiah Withington 05/19/2013, 12:50 PM

## 2013-05-19 NOTE — Progress Notes (Signed)
   Subjective: 2 Days Post-Op Procedure(s) (LRB): IRRIGATION AND DEBRIDEMENT RIGHT KNEE (Right) Patient reports pain as mild.   Patient seen in rounds with Dr. Lequita Halt. Patient is well, and has had no acute complaints or problems Patient is ready to go home today.  Objective: Vital signs in last 24 hours: Temp:  [97.7 F (36.5 C)-98 F (36.7 C)] 97.7 F (36.5 C) (10/30 0518) Pulse Rate:  [60-68] 68 (10/30 0518) Resp:  [16-20] 16 (10/30 0518) BP: (123-132)/(54-70) 131/67 mmHg (10/30 0518) SpO2:  [96 %-100 %] 96 % (10/30 0518)  Intake/Output from previous day:  Intake/Output Summary (Last 24 hours) at 05/19/13 0743 Last data filed at 05/19/13 0709  Gross per 24 hour  Intake   1150 ml  Output   2725 ml  Net  -1575 ml    Intake/Output this shift: Total I/O In: -  Out: 250 [Urine:250]  Labs:  Recent Labs  05/16/13 1200 05/18/13 0505 05/19/13 0420  HGB 13.0 11.2* 11.5*    Recent Labs  05/18/13 0505 05/19/13 0420  WBC 7.2 8.0  RBC 3.89* 4.03*  HCT 32.7* 33.6*  PLT 288 320    Recent Labs  05/18/13 0505 05/19/13 0420  NA 133* 135  K 4.2 3.9  CL 100 100  CO2 25 23  BUN 18 10  CREATININE 0.81 0.72  GLUCOSE 93 93  CALCIUM 9.1 9.7    Recent Labs  05/17/13 1225  INR 1.21    EXAM: General - Patient is Alert, Appropriate and Oriented Extremity - Neurovascular intact Sensation intact distally Dorsiflexion/Plantar flexion intact No cellulitis present Drains removed without difficulty. Incision - clean, dry, no drainage, healing, staples intact. Motor Function - intact, moving foot and toes well on exam.  Expected some drainage from the upper lateral area where the drains were removed.  Reinforce and change dressing as necessary.  Assessment/Plan: 2 Days Post-Op Procedure(s) (LRB): IRRIGATION AND DEBRIDEMENT RIGHT KNEE (Right) Procedure(s) (LRB): IRRIGATION AND DEBRIDEMENT RIGHT KNEE (Right) Past Medical History  Diagnosis Date  . Impaired  hearing     NO HEARING AIDS  . Anxiety   . Glaucoma     BILATERAL  . Cataract     LEFT EYE  . Hypertension   . GERD (gastroesophageal reflux disease)   . Arthritis   . Infection 02/01/13    RIGHT KNEE  S/P RT UNIKNEE REPLACEMENT ON 01/03/13 - IS TO HAVE SURGERY 7/16 I&D   Active Problems:   * No active hospital problems. *  Estimated body mass index is 25.63 kg/(m^2) as calculated from the following:   Height as of this encounter: 6' (1.829 m).   Weight as of this encounter: 85.73 kg (189 lb). Up with therapy Discharge home with home health Diet - Regular diet Follow up - in 1 week, next Thursday - 05/26/2013 Activity - WBAT Disposition - Home Condition Upon Discharge - Good D/C Meds - See DC Summary DVT Prophylaxis - Aspirin  PERKINS, ALEXZANDREW 05/19/2013, 7:43 AM

## 2013-05-19 NOTE — Progress Notes (Signed)
DC instructions were reviewed with the pt and the daughter at the bedside; teach method used. All questions and concerns were addressed, VSS, medicated for pain before dc, NAD, DSG clean dry and intact, all tubes and drains removed. Pt to follow up with MD in one week he will call and verify his appt. All belongings sent home with the pt. All other skin intact with the exception of his incision site.

## 2013-05-19 NOTE — Discharge Summary (Signed)
Physician Discharge Summary   Patient ID: Roger Cameron MRN: [id number] DOB/AGE: [id number] 77 y.o.  Admit date: 05/17/2013 Discharge date: 05/19/2013  Primary Diagnosis:  Infected right knee unicompartment replacement.  Admission Diagnoses:  Past Medical History  Diagnosis Date  . Impaired hearing     NO HEARING AIDS  . Anxiety   . Glaucoma     BILATERAL  . Cataract     LEFT EYE  . Hypertension   . GERD (gastroesophageal reflux disease)   . Arthritis   . Infection 02/01/13    RIGHT KNEE  S/P RT UNIKNEE REPLACEMENT ON 01/03/13 - IS TO HAVE SURGERY 7/16 I&D   Discharge Diagnoses:   Active Problems:   * No active hospital problems. *  Estimated body mass index is 25.63 kg/(m^2) as calculated from the following:   Height as of this encounter: 6' (1.829 m).   Weight as of this encounter: 85.73 kg (189 lb).  Procedure:  Procedure(s) (LRB): IRRIGATION AND DEBRIDEMENT RIGHT KNEE (Right)   Consults: None  HPI: Roger Cameron is an 79 year old male who has had  problems with his right knee since undergoing unicompartment replacement  in June of this year. Initially, he had wound healing problems  requiring an irrigation and debridement. For the past 3-4 weeks, he has  had swelling. Aspirations have been negative. Due to persistent  swelling, lab work has also been performed which did show elevated white  count, C-reactive protein and sed rate. I felt that he most likely had  an undetected deeper infection and thus presents now for irrigation and  debridement.  Laboratory Data: Admission on 05/17/2013, Discharged on 05/19/2013  Component Date Value Range Status  . Sodium 05/17/2013 133* 135 - 145 mEq/L Final  . Potassium 05/17/2013 4.0  3.5 - 5.1 mEq/L Final   Comment: REPEATED TO VERIFY                          DELTA CHECK NOTED  . Chloride 05/17/2013 100  96 - 112 mEq/L Final  . CO2 05/17/2013 24  19 - 32 mEq/L Final  . Glucose, Bld 05/17/2013 101* 70 - 99 mg/dL  Final  . BUN 14/78/2956 17  6 - 23 mg/dL Final  . Creatinine, Ser 05/17/2013 0.85  0.50 - 1.35 mg/dL Final  . Calcium 21/30/8657 10.0  8.4 - 10.5 mg/dL Final  . Total Protein 05/17/2013 8.0  6.0 - 8.3 g/dL Final  . Albumin 84/69/6295 3.5  3.5 - 5.2 g/dL Final  . AST 28/41/3244 25  0 - 37 U/L Final  . ALT 05/17/2013 29  0 - 53 U/L Final  . Alkaline Phosphatase 05/17/2013 145* 39 - 117 U/L Final  . Total Bilirubin 05/17/2013 0.6  0.3 - 1.2 mg/dL Final  . GFR calc non Af Amer 05/17/2013 79* >90 mL/min Final  . GFR calc Af Amer 05/17/2013 >90  >90 mL/min Final   Comment: (NOTE)                          The eGFR has been calculated using the CKD EPI equation.                          This calculation has not been validated in all clinical situations.  eGFR's persistently <90 mL/min signify possible Chronic Kidney                          Disease.  [name] Prothrombin Time 05/17/2013 15.0  11.6 - 15.2 seconds Final  . INR 05/17/2013 1.21  0.00 - 1.49 Final  . aPTT 05/17/2013 34  24 - 37 seconds Final  . Color, Urine 05/17/2013 YELLOW  YELLOW Final  . APPearance 05/17/2013 CLEAR  CLEAR Final  . Specific Gravity, Urine 05/17/2013 1.021  1.005 - 1.030 Final  . pH 05/17/2013 7.0  5.0 - 8.0 Final  . Glucose, UA 05/17/2013 NEGATIVE  NEGATIVE mg/dL Final  . Hgb urine dipstick 05/17/2013 NEGATIVE  NEGATIVE Final  . Bilirubin Urine 05/17/2013 NEGATIVE  NEGATIVE Final  . Ketones, ur 05/17/2013 NEGATIVE  NEGATIVE mg/dL Final  . Protein, ur 16/04/9603 NEGATIVE  NEGATIVE mg/dL Final  . Urobilinogen, UA 05/17/2013 1.0  0.0 - 1.0 mg/dL Final  . Nitrite 54/03/8118 NEGATIVE  NEGATIVE Final  . Leukocytes, UA 05/17/2013 LARGE* NEGATIVE Final  . WBC, UA 05/17/2013 11-20  <3 WBC/hpf Final  . Specimen Description 05/17/2013 SYNOVIAL RIGHT KNEE   Final  . Special Requests 05/17/2013 NONE   Final  . Gram Stain 05/17/2013    Final                   Value:MODERATE WBC PRESENT,BOTH PMN AND  MONONUCLEAR                         NO ORGANISMS SEEN                         Performed at [facility]  . Culture 05/17/2013    Final                   Value:NO GROWTH 3 DAYS                         Performed at [facility]  . Report Status 05/17/2013 05/20/2013 FINAL   Final  . Specimen Description 05/17/2013 KNEE RIGHT   Final  . Special Requests 05/17/2013 NONE   Final  . Gram Stain 05/17/2013    Final                   Value:MODERATE WBC PRESENT,BOTH PMN AND MONONUCLEAR                         NO ORGANISMS SEEN                         Performed at [facility]  . Culture 05/17/2013    Final                   Value:NO ANAEROBES ISOLATED                         Performed at [facility]  . Report Status 05/17/2013 05/23/2013 FINAL   Final  . WBC 05/18/2013 7.2  4.0 - 10.5 K/uL Final  . RBC 05/18/2013 3.89* 4.22 - 5.81 MIL/uL Final  . Hemoglobin 05/18/2013 11.2* 13.0 - 17.0 g/dL Final  . HCT 14/78/2956 32.7* 39.0 - 52.0 % Final  . MCV 05/18/2013 84.1  78.0 - 100.0 fL Final  .  MCH 05/18/2013 28.8  26.0 - 34.0 pg Final  . MCHC 05/18/2013 34.3  30.0 - 36.0 g/dL Final  . RDW 16/04/9603 13.2  11.5 - 15.5 % Final  . Platelets 05/18/2013 288  150 - 400 K/uL Final  . Sodium 05/18/2013 133* 135 - 145 mEq/L Final  . Potassium 05/18/2013 4.2  3.5 - 5.1 mEq/L Final  . Chloride 05/18/2013 100  96 - 112 mEq/L Final  . CO2 05/18/2013 25  19 - 32 mEq/L Final  . Glucose, Bld 05/18/2013 93  70 - 99 mg/dL Final  . BUN 54/03/8118 18  6 - 23 mg/dL Final  . Creatinine, Ser 05/18/2013 0.81  0.50 - 1.35 mg/dL Final  . Calcium 14/78/2956 9.1  8.4 - 10.5 mg/dL Final  . GFR calc non Af Amer 05/18/2013 81* >90 mL/min Final  . GFR calc Af Amer 05/18/2013 >90  >90 mL/min Final   Comment: (NOTE)                          The eGFR has been calculated using the CKD EPI equation.                          This calculation has not been validated in all clinical situations.                           eGFR's persistently <90 mL/min signify possible Chronic Kidney                          Disease.  . WBC 05/19/2013 8.0  4.0 - 10.5 K/uL Final  . RBC 05/19/2013 4.03* 4.22 - 5.81 MIL/uL Final  . Hemoglobin 05/19/2013 11.5* 13.0 - 17.0 g/dL Final  . HCT 21/30/8657 33.6* 39.0 - 52.0 % Final  . MCV 05/19/2013 83.4  78.0 - 100.0 fL Final  . MCH 05/19/2013 28.5  26.0 - 34.0 pg Final  . MCHC 05/19/2013 34.2  30.0 - 36.0 g/dL Final  . RDW 84/69/6295 13.1  11.5 - 15.5 % Final  . Platelets 05/19/2013 320  150 - 400 K/uL Final  . Sodium 05/19/2013 135  135 - 145 mEq/L Final  . Potassium 05/19/2013 3.9  3.5 - 5.1 mEq/L Final  . Chloride 05/19/2013 100  96 - 112 mEq/L Final  . CO2 05/19/2013 23  19 - 32 mEq/L Final  . Glucose, Bld 05/19/2013 93  70 - 99 mg/dL Final  . BUN 28/41/3244 10  6 - 23 mg/dL Final  . Creatinine, Ser 05/19/2013 0.72  0.50 - 1.35 mg/dL Final  . Calcium 07/23/7251 9.7  8.4 - 10.5 mg/dL Final  . GFR calc non Af Amer 05/19/2013 85* >90 mL/min Final  . GFR calc Af Amer 05/19/2013 >90  >90 mL/min Final   Comment: (NOTE)                          The eGFR has been calculated using the CKD EPI equation.                          This calculation has not been validated in all clinical situations.                          eGFR's persistently <  90 mL/min signify possible Chronic Kidney                          Disease.  Hospital Outpatient Visit on 05/16/2013  Component Date Value Range Status  . WBC 05/16/2013 10.6* 4.0 - 10.5 K/uL Final  . RBC 05/16/2013 4.49  4.22 - 5.81 MIL/uL Final  . Hemoglobin 05/16/2013 13.0  13.0 - 17.0 g/dL Final  . HCT 54/03/8118 37.9* 39.0 - 52.0 % Final  . MCV 05/16/2013 84.4  78.0 - 100.0 fL Final  . MCH 05/16/2013 29.0  26.0 - 34.0 pg Final  . MCHC 05/16/2013 34.3  30.0 - 36.0 g/dL Final  . RDW 14/78/2956 13.3  11.5 - 15.5 % Final  . Platelets 05/16/2013 322  150 - 400 K/uL Final  . Sodium 05/16/2013 131* 135 - 145 mEq/L Final  .  Potassium 05/16/2013 5.4* 3.5 - 5.1 mEq/L Final  . Chloride 05/16/2013 96  96 - 112 mEq/L Final  . CO2 05/16/2013 24  19 - 32 mEq/L Final  . Glucose, Bld 05/16/2013 104* 70 - 99 mg/dL Final  . BUN 21/30/8657 24* 6 - 23 mg/dL Final  . Creatinine, Ser 05/16/2013 0.88  0.50 - 1.35 mg/dL Final  . Calcium 84/69/6295 10.2  8.4 - 10.5 mg/dL Final  . GFR calc non Af Amer 05/16/2013 78* >90 mL/min Final  . GFR calc Af Amer 05/16/2013 >90  >90 mL/min Final   Comment: (NOTE)                          The eGFR has been calculated using the CKD EPI equation.                          This calculation has not been validated in all clinical situations.                          eGFR's persistently <90 mL/min signify possible Chronic Kidney                          Disease.  . ABO/RH(D) 05/16/2013 O POS   Final  . Antibody Screen 05/16/2013 NEG   Final  . Sample Expiration 05/16/2013 05/19/2013   Final     X-Rays:No results found.  EKG: Orders placed during the hospital encounter of 01/03/13  . EKG     Hospital Course: Roger Cameron is a 79 y.o. who was admitted to [facility]. They were brought to the operating room on 05/17/2013 and underwent Procedure(s): IRRIGATION AND DEBRIDEMENT RIGHT KNEE.  Patient tolerated the procedure well and was later transferred to the recovery room and then to the orthopaedic floor for postoperative care.  They were given PO and IV analgesics for pain control following their surgery.  They were given 24 hours of postoperative antibiotics of  Anti-infectives   Start     Dose/Rate Route Frequency Ordered Stop   05/19/13 0000  amoxicillin-clavulanate (AUGMENTIN) 875-125 MG per tablet     1 tablet Oral 2 times daily 05/19/13 0752     05/19/13 0000  doxycycline (VIBRAMYCIN) 50 MG capsule     100 mg Oral 2 times daily 05/19/13 0752     05/17/13 2200  ceFAZolin (ANCEF) IVPB 2 g/50 mL premix  Status:  Discontinued     2  g 100 mL/hr over 30 Minutes Intravenous Every  6 hours 05/17/13 1841 05/19/13 1238   05/17/13 1637  vancomycin (VANCOCIN) powder  Status:  Discontinued       As needed 05/17/13 1637 05/17/13 1716   05/17/13 1230  ceFAZolin (ANCEF) IVPB 2 g/50 mL premix     2 g 100 mL/hr over 30 Minutes Intravenous On call to O.R. 05/17/13 1208 05/17/13 1600     and started on DVT prophylaxis in the form of Aspirin.   PT and OT were ordered for total joint protocol.  Discharge planning consulted to help with postop disposition and equipment needs.  Patient had a decent night on the evening of surgery.  They started to get up OOB with therapy on day one. Hemovac drain was left in place that day.  Continued to work with therapy into day two.  Dressing was changed on day two and the incision was clean, dry, no drainage, healing, staples intact.  Patient was seen in rounds and was ready to go home later that day after getting up and about.   Discharge Medications: Prior to Admission medications   Medication Sig Start Date End Date Taking? Authorizing Provider  ALPRAZolam Prudy Feeler) 0.5 MG tablet Take 0.5 mg by mouth daily as needed for anxiety.   Yes Historical Provider, MD  atenolol (TENORMIN) 50 MG tablet Take 25 mg by mouth every morning. Takes 1/2 tablet   Yes Historical Provider, MD  hydrochlorothiazide (HYDRODIURIL) 25 MG tablet Take 25 mg by mouth every morning.   Yes Historical Provider, MD  latanoprost (XALATAN) 0.005 % ophthalmic solution Place 1 drop into both eyes at bedtime.   Yes Historical Provider, MD  valsartan (DIOVAN) 320 MG tablet Take 320 mg by mouth every morning.   Yes Historical Provider, MD  acetaminophen (TYLENOL) 500 MG tablet Take 500 mg by mouth every 6 (six) hours as needed for pain.    Historical Provider, MD  amoxicillin-clavulanate (AUGMENTIN) 875-125 MG per tablet Take 1 tablet by mouth 2 (two) times daily. 05/19/13   Hanaa Payes Julien Girt, PA-C  aspirin EC 325 MG EC tablet Take 1 tablet (325 mg total) by mouth daily with breakfast.  05/19/13   Dianne Bady Julien Girt, PA-C  doxycycline (VIBRAMYCIN) 50 MG capsule Take 2 capsules (100 mg total) by mouth 2 (two) times daily. 05/19/13   Eliyohu Class, PA-C  lansoprazole (PREVACID) 30 MG capsule Take 30 mg by mouth daily.     Historical Provider, MD  methocarbamol (ROBAXIN) 500 MG tablet Take 1 tablet (500 mg total) by mouth every 6 (six) hours as needed. 05/19/13   Kim Lauver, PA-C  oxyCODONE (OXY IR/ROXICODONE) 5 MG immediate release tablet Take 1-2 tablets (5-10 mg total) by mouth every 3 (three) hours as needed. 05/19/13   Gonsalo Cuthbertson, PA-C  traMADol (ULTRAM) 50 MG tablet Take 1-2 tablets (50-100 mg total) by mouth every 6 (six) hours as needed (mild pain). 05/19/13   Ximena Todaro Julien Girt, PA-C   Discharge home with home health  Diet - Regular diet  Follow up - in 1 week, next Thursday - 05/26/2013  Activity - WBAT  Disposition - Home  Condition Upon Discharge - Good  D/C Meds - See DC Summary  DVT Prophylaxis - Aspirin       Discharge Orders   Future Orders Complete By Expires   Call MD / Call 911  As directed    Comments:     If you experience chest pain or shortness of breath, CALL 911 and  be transported to the hospital emergency room.  If you develope a fever above 101 F, pus (white drainage) or increased drainage or redness at the wound, or calf pain, call your surgeon's office.   Change dressing  As directed    Comments:     Change dressing daily with sterile 4 x 4 inch gauze dressing and apply TED hose. Do not submerge the incision under water.   Constipation Prevention  As directed    Comments:     Drink plenty of fluids.  Prune juice may be helpful.  You may use a stool softener, such as Colace (over the counter) 100 mg twice a day.  Use MiraLax (over the counter) for constipation as needed.   Diet general  As directed    Discharge instructions  As directed    Comments:     Pick up stool softner and laxative for home. Do not submerge  incision under water. May shower. Continue to use ice for pain and swelling from surgery.  Take a full dose 325 mg enteric coated aspirin daily for one month.  Follow up in one week, next Thursday, with Dr. Lequita Halt.  Call office for appointment time.   Do not put a pillow under the knee. Place it under the heel.  As directed    Do not sit on low chairs, stoools or toilet seats, as it may be difficult to get up from low surfaces  As directed    Driving restrictions  As directed    Comments:     No driving until released by the physician.   Increase activity slowly as tolerated  As directed    Lifting restrictions  As directed    Comments:     No lifting until released by the physician.   Patient may shower  As directed    Comments:     You may shower without a dressing once there is no drainage.  Do not wash over the wound.  If drainage remains, do not shower until drainage stops.   TED hose  As directed    Comments:     Use stockings (TED hose) for 3 weeks on both leg(s).  You may remove them at night for sleeping.   Weight bearing as tolerated  As directed    Questions:     Laterality:     Extremity:         Medication List         acetaminophen 500 MG tablet  Commonly known as:  TYLENOL  Take 500 mg by mouth every 6 (six) hours as needed for pain.     ALPRAZolam 0.5 MG tablet  Commonly known as:  XANAX  Take 0.5 mg by mouth daily as needed for anxiety.     amoxicillin-clavulanate 875-125 MG per tablet  Commonly known as:  AUGMENTIN  Take 1 tablet by mouth 2 (two) times daily.     aspirin 325 MG EC tablet  Take 1 tablet (325 mg total) by mouth daily with breakfast.     atenolol 50 MG tablet  Commonly known as:  TENORMIN  Take 25 mg by mouth every morning. Takes 1/2 tablet     doxycycline 50 MG capsule  Commonly known as:  VIBRAMYCIN  Take 2 capsules (100 mg total) by mouth 2 (two) times daily.     hydrochlorothiazide 25 MG tablet  Commonly known as:   HYDRODIURIL  Take 25 mg by mouth every morning.     lansoprazole  30 MG capsule  Commonly known as:  PREVACID  Take 30 mg by mouth daily.     latanoprost 0.005 % ophthalmic solution  Commonly known as:  XALATAN  Place 1 drop into both eyes at bedtime.     methocarbamol 500 MG tablet  Commonly known as:  ROBAXIN  Take 1 tablet (500 mg total) by mouth every 6 (six) hours as needed.     oxyCODONE 5 MG immediate release tablet  Commonly known as:  Oxy IR/ROXICODONE  Take 1-2 tablets (5-10 mg total) by mouth every 3 (three) hours as needed.     traMADol 50 MG tablet  Commonly known as:  ULTRAM  Take 1-2 tablets (50-100 mg total) by mouth every 6 (six) hours as needed (mild pain).     valsartan 320 MG tablet  Commonly known as:  DIOVAN  Take 320 mg by mouth every morning.       Follow-up Information   Follow up with Loanne Drilling, MD. Schedule an appointment as soon as possible for a visit on 05/26/2013. (Call the office for appointment time.)    Specialty:  Orthopedic Surgery   Contact information:   [address] [place] [address] [phone]       Signed: Patrica Duel 05/26/2013, 9:50 AM

## 2013-05-20 DIAGNOSIS — I10 Essential (primary) hypertension: Secondary | ICD-10-CM | POA: Diagnosis not present

## 2013-05-20 DIAGNOSIS — Z96659 Presence of unspecified artificial knee joint: Secondary | ICD-10-CM | POA: Diagnosis not present

## 2013-05-20 DIAGNOSIS — D649 Anemia, unspecified: Secondary | ICD-10-CM | POA: Diagnosis not present

## 2013-05-20 DIAGNOSIS — Z471 Aftercare following joint replacement surgery: Secondary | ICD-10-CM | POA: Diagnosis not present

## 2013-05-20 DIAGNOSIS — T8450XA Infection and inflammatory reaction due to unspecified internal joint prosthesis, initial encounter: Secondary | ICD-10-CM | POA: Diagnosis not present

## 2013-05-20 DIAGNOSIS — Z7982 Long term (current) use of aspirin: Secondary | ICD-10-CM | POA: Diagnosis not present

## 2013-05-20 LAB — BODY FLUID CULTURE: Culture: NO GROWTH

## 2013-05-20 NOTE — Anesthesia Postprocedure Evaluation (Signed)
  Anesthesia Post-op Note  Patient: Roger Cameron  Procedure(s) Performed: Procedure(s) (LRB): IRRIGATION AND DEBRIDEMENT RIGHT KNEE (Right)  Patient Location: PACU  Anesthesia Type: General  Level of Consciousness: awake and alert   Airway and Oxygen Therapy: Patient Spontanous Breathing  Post-op Pain: mild  Post-op Assessment: Post-op Vital signs reviewed, Patient's Cardiovascular Status Stable, Respiratory Function Stable, Patent Airway and No signs of Nausea or vomiting  Last Vitals:  Filed Vitals:   05/19/13 0933  BP: 135/81  Pulse:   Temp:   Resp:     Post-op Vital Signs: stable   Complications: No apparent anesthesia complications

## 2013-05-23 DIAGNOSIS — T8450XA Infection and inflammatory reaction due to unspecified internal joint prosthesis, initial encounter: Secondary | ICD-10-CM | POA: Diagnosis not present

## 2013-05-23 DIAGNOSIS — Z7982 Long term (current) use of aspirin: Secondary | ICD-10-CM | POA: Diagnosis not present

## 2013-05-23 DIAGNOSIS — D649 Anemia, unspecified: Secondary | ICD-10-CM | POA: Diagnosis not present

## 2013-05-23 DIAGNOSIS — Z96659 Presence of unspecified artificial knee joint: Secondary | ICD-10-CM | POA: Diagnosis not present

## 2013-05-23 DIAGNOSIS — I10 Essential (primary) hypertension: Secondary | ICD-10-CM | POA: Diagnosis not present

## 2013-05-23 LAB — ANAEROBIC CULTURE

## 2013-05-25 DIAGNOSIS — T8450XA Infection and inflammatory reaction due to unspecified internal joint prosthesis, initial encounter: Secondary | ICD-10-CM | POA: Diagnosis not present

## 2013-05-25 DIAGNOSIS — Z7982 Long term (current) use of aspirin: Secondary | ICD-10-CM | POA: Diagnosis not present

## 2013-05-25 DIAGNOSIS — D649 Anemia, unspecified: Secondary | ICD-10-CM | POA: Diagnosis not present

## 2013-05-25 DIAGNOSIS — Z96659 Presence of unspecified artificial knee joint: Secondary | ICD-10-CM | POA: Diagnosis not present

## 2013-05-25 DIAGNOSIS — I10 Essential (primary) hypertension: Secondary | ICD-10-CM | POA: Diagnosis not present

## 2013-05-26 ENCOUNTER — Other Ambulatory Visit: Payer: Self-pay

## 2013-05-26 DIAGNOSIS — L089 Local infection of the skin and subcutaneous tissue, unspecified: Secondary | ICD-10-CM | POA: Diagnosis not present

## 2013-05-30 DIAGNOSIS — D649 Anemia, unspecified: Secondary | ICD-10-CM | POA: Diagnosis not present

## 2013-05-30 DIAGNOSIS — I10 Essential (primary) hypertension: Secondary | ICD-10-CM | POA: Diagnosis not present

## 2013-05-30 DIAGNOSIS — T8450XA Infection and inflammatory reaction due to unspecified internal joint prosthesis, initial encounter: Secondary | ICD-10-CM | POA: Diagnosis not present

## 2013-05-30 DIAGNOSIS — Z96659 Presence of unspecified artificial knee joint: Secondary | ICD-10-CM | POA: Diagnosis not present

## 2013-05-30 DIAGNOSIS — Z7982 Long term (current) use of aspirin: Secondary | ICD-10-CM | POA: Diagnosis not present

## 2013-06-07 DIAGNOSIS — L089 Local infection of the skin and subcutaneous tissue, unspecified: Secondary | ICD-10-CM | POA: Diagnosis not present

## 2013-06-21 DIAGNOSIS — H409 Unspecified glaucoma: Secondary | ICD-10-CM | POA: Diagnosis not present

## 2013-06-21 DIAGNOSIS — H4011X0 Primary open-angle glaucoma, stage unspecified: Secondary | ICD-10-CM | POA: Diagnosis not present

## 2013-07-12 ENCOUNTER — Other Ambulatory Visit: Payer: Self-pay | Admitting: Orthopedic Surgery

## 2013-07-12 DIAGNOSIS — T8450XA Infection and inflammatory reaction due to unspecified internal joint prosthesis, initial encounter: Secondary | ICD-10-CM | POA: Diagnosis not present

## 2013-07-12 DIAGNOSIS — L089 Local infection of the skin and subcutaneous tissue, unspecified: Secondary | ICD-10-CM | POA: Diagnosis not present

## 2013-07-12 NOTE — Progress Notes (Signed)
Preoperative surgical orders have been place into the Epic hospital system for Lum Keas on 07/12/2013, 4:50 PM  by Patrica Duel for surgery on 07-18-2014.  Preop Knee orders including IV Tylenol, and IV Decadron as long as there are no contraindications to the above medications. Avel Peace, PA-C

## 2013-07-15 ENCOUNTER — Encounter (HOSPITAL_COMMUNITY): Payer: Self-pay | Admitting: *Deleted

## 2013-07-15 ENCOUNTER — Encounter (HOSPITAL_COMMUNITY): Payer: Self-pay | Admitting: Pharmacy Technician

## 2013-07-15 DIAGNOSIS — B999 Unspecified infectious disease: Secondary | ICD-10-CM

## 2013-07-15 HISTORY — DX: Unspecified infectious disease: B99.9

## 2013-07-15 NOTE — Progress Notes (Signed)
PT REQUESTED I SPEAK WITH HIS NEIGHBOR Roger LEHOUCK REGARDING ALL OF HIS INSTRUCTIONS FOR SURGERY - BECAUSE HE IS HARD OF HEARING.  Roger STATES [name] HAS HAD NO CHANGES IN HIS MEDICAL HISTORY- SHE REVIEWED ALL OF HIS CURRENT MEDICATIONS WITH ME.  PREOP INSTRUCTIONS AND CHLORHEXIDINE INSTRUCTIONS/ PRECAUTIONS REVIEWED WITH Roger.

## 2013-07-18 ENCOUNTER — Encounter (HOSPITAL_COMMUNITY)
Admission: RE | Disposition: A | Discharge status: Skilled Nursing Facility | Payer: Self-pay | Source: Ambulatory Visit | Attending: Orthopedic Surgery

## 2013-07-18 ENCOUNTER — Encounter (HOSPITAL_COMMUNITY): Payer: Medicare Other | Admitting: Anesthesiology

## 2013-07-18 ENCOUNTER — Inpatient Hospital Stay (HOSPITAL_COMMUNITY)
Admission: RE | Admit: 2013-07-18 | Discharge: 2013-07-21 | DRG: 465 | Disposition: A | Discharge status: Skilled Nursing Facility | Payer: Medicare Other | Source: Ambulatory Visit | Attending: Orthopedic Surgery | Admitting: Orthopedic Surgery

## 2013-07-18 ENCOUNTER — Inpatient Hospital Stay (HOSPITAL_COMMUNITY): Payer: Medicare Other | Admitting: Anesthesiology

## 2013-07-18 ENCOUNTER — Encounter (HOSPITAL_COMMUNITY): Payer: Self-pay | Admitting: *Deleted

## 2013-07-18 DIAGNOSIS — Z96659 Presence of unspecified artificial knee joint: Secondary | ICD-10-CM

## 2013-07-18 DIAGNOSIS — M6281 Muscle weakness (generalized): Secondary | ICD-10-CM | POA: Diagnosis not present

## 2013-07-18 DIAGNOSIS — I10 Essential (primary) hypertension: Secondary | ICD-10-CM | POA: Diagnosis not present

## 2013-07-18 DIAGNOSIS — S8990XA Unspecified injury of unspecified lower leg, initial encounter: Secondary | ICD-10-CM | POA: Diagnosis not present

## 2013-07-18 DIAGNOSIS — J8410 Pulmonary fibrosis, unspecified: Secondary | ICD-10-CM | POA: Diagnosis not present

## 2013-07-18 DIAGNOSIS — H409 Unspecified glaucoma: Secondary | ICD-10-CM | POA: Diagnosis present

## 2013-07-18 DIAGNOSIS — D649 Anemia, unspecified: Secondary | ICD-10-CM | POA: Diagnosis not present

## 2013-07-18 DIAGNOSIS — T8450XA Infection and inflammatory reaction due to unspecified internal joint prosthesis, initial encounter: Principal | ICD-10-CM | POA: Diagnosis present

## 2013-07-18 DIAGNOSIS — H9190 Unspecified hearing loss, unspecified ear: Secondary | ICD-10-CM | POA: Diagnosis not present

## 2013-07-18 DIAGNOSIS — R269 Unspecified abnormalities of gait and mobility: Secondary | ICD-10-CM | POA: Diagnosis not present

## 2013-07-18 DIAGNOSIS — Z452 Encounter for adjustment and management of vascular access device: Secondary | ICD-10-CM | POA: Diagnosis not present

## 2013-07-18 DIAGNOSIS — R262 Difficulty in walking, not elsewhere classified: Secondary | ICD-10-CM | POA: Diagnosis not present

## 2013-07-18 DIAGNOSIS — T8459XA Infection and inflammatory reaction due to other internal joint prosthesis, initial encounter: Secondary | ICD-10-CM

## 2013-07-18 DIAGNOSIS — IMO0002 Reserved for concepts with insufficient information to code with codable children: Secondary | ICD-10-CM

## 2013-07-18 DIAGNOSIS — F411 Generalized anxiety disorder: Secondary | ICD-10-CM | POA: Diagnosis not present

## 2013-07-18 DIAGNOSIS — Z89529 Acquired absence of unspecified knee: Secondary | ICD-10-CM

## 2013-07-18 DIAGNOSIS — K219 Gastro-esophageal reflux disease without esophagitis: Secondary | ICD-10-CM | POA: Diagnosis present

## 2013-07-18 DIAGNOSIS — T847XXA Infection and inflammatory reaction due to other internal orthopedic prosthetic devices, implants and grafts, initial encounter: Secondary | ICD-10-CM | POA: Diagnosis not present

## 2013-07-18 DIAGNOSIS — R279 Unspecified lack of coordination: Secondary | ICD-10-CM | POA: Diagnosis not present

## 2013-07-18 DIAGNOSIS — Z9849 Cataract extraction status, unspecified eye: Secondary | ICD-10-CM | POA: Diagnosis not present

## 2013-07-18 DIAGNOSIS — M009 Pyogenic arthritis, unspecified: Secondary | ICD-10-CM | POA: Diagnosis present

## 2013-07-18 DIAGNOSIS — M25569 Pain in unspecified knee: Secondary | ICD-10-CM | POA: Diagnosis not present

## 2013-07-18 DIAGNOSIS — Y831 Surgical operation with implant of artificial internal device as the cause of abnormal reaction of the patient, or of later complication, without mention of misadventure at the time of the procedure: Secondary | ICD-10-CM | POA: Diagnosis present

## 2013-07-18 HISTORY — PX: EXCISIONAL TOTAL KNEE ARTHROPLASTY WITH ANTIBIOTIC SPACERS: SHX5827

## 2013-07-18 LAB — CBC
HCT: 32.3 % — ABNORMAL LOW (ref 39.0–52.0)
MCHC: 35 g/dL (ref 30.0–36.0)
MCV: 82.8 fL (ref 78.0–100.0)
Platelets: 531 10*3/uL — ABNORMAL HIGH (ref 150–400)
RBC: 3.9 MIL/uL — ABNORMAL LOW (ref 4.22–5.81)
RDW: 14.2 % (ref 11.5–15.5)
WBC: 11.5 10*3/uL — ABNORMAL HIGH (ref 4.0–10.5)

## 2013-07-18 LAB — COMPREHENSIVE METABOLIC PANEL
AST: 16 U/L (ref 0–37)
Albumin: 3.1 g/dL — ABNORMAL LOW (ref 3.5–5.2)
Alkaline Phosphatase: 95 U/L (ref 39–117)
BUN: 12 mg/dL (ref 6–23)
Chloride: 102 mEq/L (ref 96–112)
GFR calc non Af Amer: 81 mL/min — ABNORMAL LOW (ref >90)
Potassium: 4.1 mEq/L (ref 3.5–5.1)
Sodium: 135 mEq/L (ref 135–145)
Total Bilirubin: 0.5 mg/dL (ref 0.3–1.2)
Total Protein: 7.6 g/dL (ref 6.0–8.3)

## 2013-07-18 LAB — PROTIME-INR: Prothrombin Time: 15.9 seconds — ABNORMAL HIGH (ref 11.6–15.2)

## 2013-07-18 LAB — APTT: aPTT: 33 seconds (ref 24–37)

## 2013-07-18 LAB — TYPE AND SCREEN
ABO/RH(D): O POS
Antibody Screen: NEGATIVE

## 2013-07-18 IMAGING — CT CT ANKLE*R* W/O CM
3 of 5 series · 7 of 14 positions shown, 8 images · non-contrast
Comparison: None.

CLINICAL DATA: Patient for knee replacement.  Planning examination.

CT OF THE RIGHT HIP, ANKLE AND KNEE WITHOUT CONTRAST
TECHNIQUE: Multidetector CT imaging was performed according to the
standard protocol. Multiplanar CT image reconstructions were also
generated.,Technique:  Multidetector CT imaging was performed
following the standard protocol during bolus administration

[Series 5: knee soft · axial · 0.39mm/px · z∈[-624,-512]mm · 2 of 136 slices shown]
[im 46/136  soft-tissue]
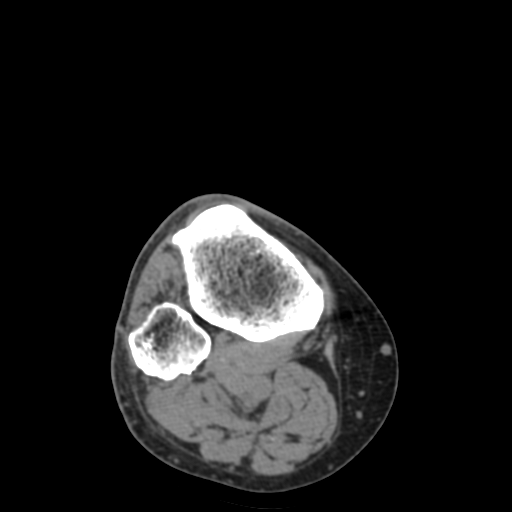
[im 91/136  soft-tissue]
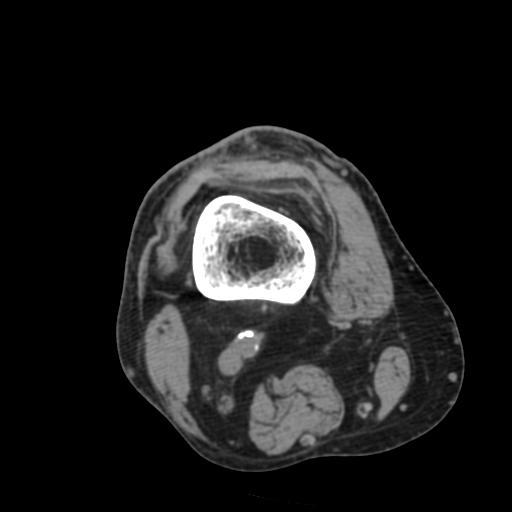

[Series 400: sag bone · axial · 0.39mm/px · z∈[-570,-396]mm · 3 of 111 slices shown, 4 images]
[im 1/111  soft-tissue]
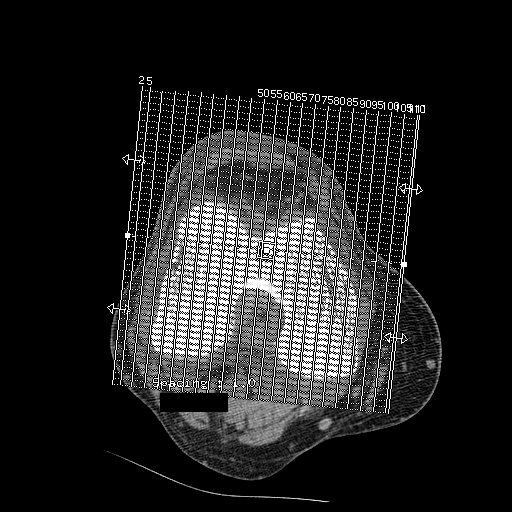
[im 1/111  bone]
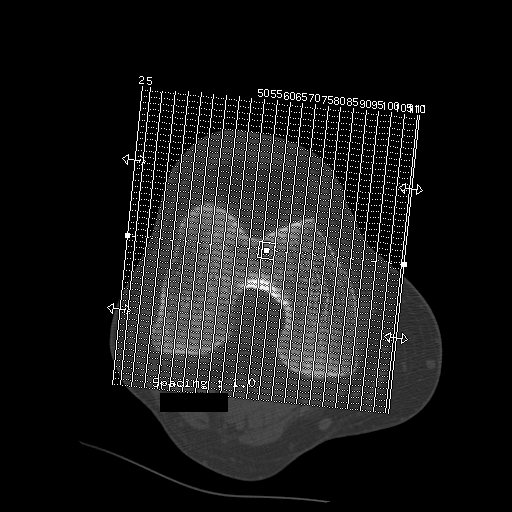
[im 56/111  bone]
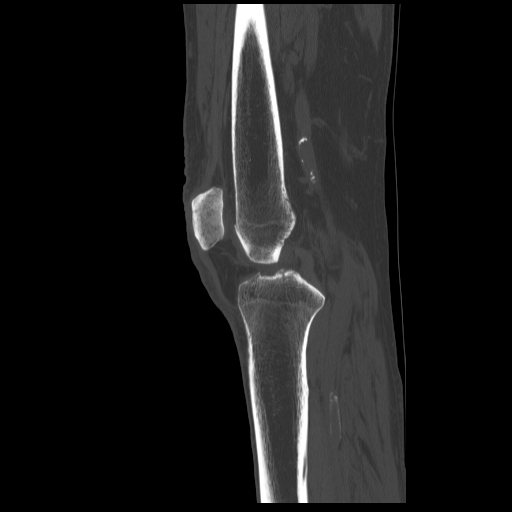
[im 111/111  bone]
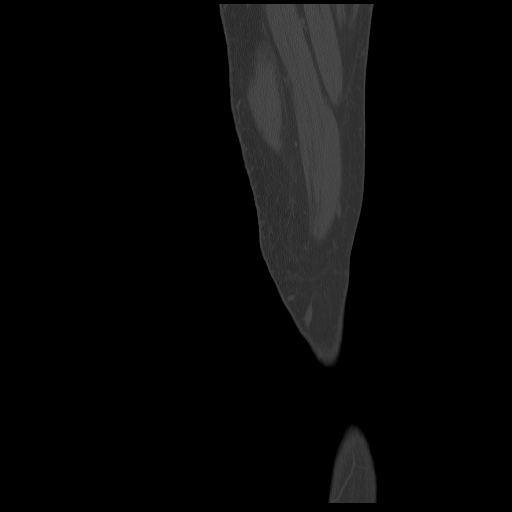

[Series 401: cor bone · coronal · 0.68mm/px · 2 of 146 slices shown]
[im 49/146  bone]
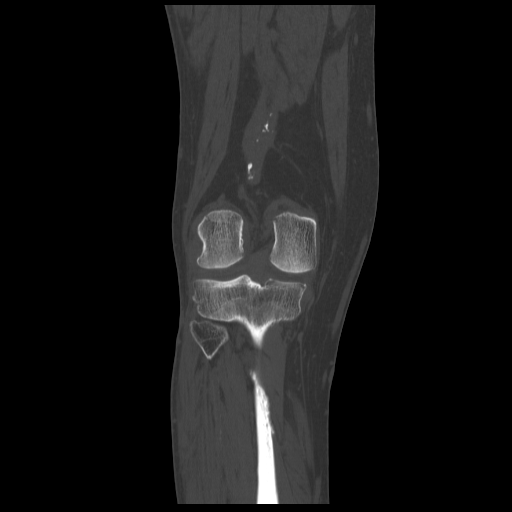
[im 97/146  bone]
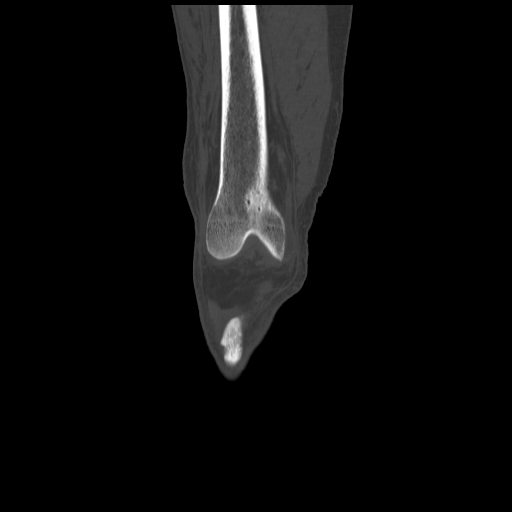

[7 of 14 positions shown; findings below may reference images not displayed]

FINDINGS: RIGHT HIP: The right hip is located.  No fracture is identified.
No joint effusion.  No notable degenerative change.  Imaged
intrapelvic contents demonstrate prostate calcifications.

RIGHT KNEE: No fracture or dislocation is identified.  There is
tricompartmental degenerative change appearing worst in the medial
compartment where there is near bone-on-bone joint space narrowing
and small osteophytes.  Small joint effusion is noted. There is
some atherosclerotic vascular disease.

RIGHT ANKLE: No focal bony abnormality is identified.  No
osteochondral lesion is seen.  Soft tissue structures are
unremarkable.
IMPRESSION: 1.  No acute finding.
2.  Degenerative change about the knee most notable in the medial
compartment as described.

## 2013-07-18 SURGERY — REMOVAL, TOTAL ARTHROPLASTY HARDWARE, KNEE, WITH ANTIBIOTIC SPACER INSERTION
Anesthesia: General | Site: Knee | Laterality: Right

## 2013-07-18 MED ORDER — TRAMADOL HCL 50 MG PO TABS
50.0000 mg | ORAL_TABLET | Freq: Four times a day (QID) | ORAL | Status: DC | PRN
  Administered 2013-07-19 – 2013-07-21 (×4): 100 mg via ORAL
  Filled 2013-07-18 (×5): qty 2

## 2013-07-18 MED ORDER — METHOCARBAMOL 100 MG/ML IJ SOLN
500.0000 mg | Freq: Four times a day (QID) | INTRAVENOUS | Status: DC | PRN
  Filled 2013-07-18: qty 5

## 2013-07-18 MED ORDER — METHOCARBAMOL 500 MG PO TABS
500.0000 mg | ORAL_TABLET | Freq: Four times a day (QID) | ORAL | Status: DC | PRN
  Administered 2013-07-19 – 2013-07-20 (×4): 500 mg via ORAL
  Filled 2013-07-18 (×4): qty 1

## 2013-07-18 MED ORDER — DEXAMETHASONE SODIUM PHOSPHATE 10 MG/ML IJ SOLN: 10.0000 mg | Freq: Once | INTRAMUSCULAR | Status: DC

## 2013-07-18 MED ORDER — HYDROMORPHONE HCL PF 1 MG/ML IJ SOLN
INTRAMUSCULAR | Status: DC | PRN
  Administered 2013-07-18 (×2): 1 mg via INTRAVENOUS

## 2013-07-18 MED ORDER — LIDOCAINE HCL (CARDIAC) 20 MG/ML IV SOLN
INTRAVENOUS | Status: AC
  Filled 2013-07-18: qty 5

## 2013-07-18 MED ORDER — HYDROMORPHONE HCL PF 2 MG/ML IJ SOLN
INTRAMUSCULAR | Status: AC
  Filled 2013-07-18: qty 1

## 2013-07-18 MED ORDER — ONDANSETRON HCL 4 MG/2ML IJ SOLN: 4.0000 mg | Freq: Four times a day (QID) | INTRAMUSCULAR | Status: DC | PRN

## 2013-07-18 MED ORDER — HYDROCHLOROTHIAZIDE 25 MG PO TABS
25.0000 mg | ORAL_TABLET | Freq: Every day | ORAL | Status: DC
  Administered 2013-07-19 – 2013-07-21 (×3): 25 mg via ORAL
  Filled 2013-07-18 (×4): qty 1

## 2013-07-18 MED ORDER — METOCLOPRAMIDE HCL 5 MG/ML IJ SOLN: 5.0000 mg | Freq: Three times a day (TID) | INTRAMUSCULAR | Status: DC | PRN

## 2013-07-18 MED ORDER — ACETAMINOPHEN 500 MG PO TABS
1000.0000 mg | ORAL_TABLET | Freq: Four times a day (QID) | ORAL | Status: AC
  Administered 2013-07-18 – 2013-07-19 (×3): 1000 mg via ORAL
  Filled 2013-07-18 (×5): qty 2

## 2013-07-18 MED ORDER — MEPERIDINE HCL 50 MG/ML IJ SOLN: 6.2500 mg | INTRAMUSCULAR | Status: DC | PRN

## 2013-07-18 MED ORDER — ATENOLOL 25 MG PO TABS
25.0000 mg | ORAL_TABLET | Freq: Every day | ORAL | Status: DC
  Administered 2013-07-19 – 2013-07-21 (×3): 25 mg via ORAL
  Filled 2013-07-18 (×4): qty 1

## 2013-07-18 MED ORDER — OXYCODONE HCL 5 MG/5ML PO SOLN
5.0000 mg | Freq: Once | ORAL | Status: DC | PRN
  Filled 2013-07-18: qty 5

## 2013-07-18 MED ORDER — DEXAMETHASONE SODIUM PHOSPHATE 10 MG/ML IJ SOLN
10.0000 mg | Freq: Every day | INTRAMUSCULAR | Status: AC
  Filled 2013-07-18: qty 1

## 2013-07-18 MED ORDER — CHLORHEXIDINE GLUCONATE 4 % EX LIQD: 60.0000 mL | Freq: Once | CUTANEOUS | Status: DC

## 2013-07-18 MED ORDER — FENTANYL CITRATE 0.05 MG/ML IJ SOLN
INTRAMUSCULAR | Status: AC
  Filled 2013-07-18: qty 5

## 2013-07-18 MED ORDER — PANTOPRAZOLE SODIUM 40 MG PO TBEC
40.0000 mg | DELAYED_RELEASE_TABLET | Freq: Every day | ORAL | Status: DC
  Administered 2013-07-19 – 2013-07-21 (×3): 40 mg via ORAL
  Filled 2013-07-18 (×3): qty 1

## 2013-07-18 MED ORDER — ALPRAZOLAM 0.5 MG PO TABS
0.5000 mg | ORAL_TABLET | Freq: Every day | ORAL | Status: DC | PRN
  Administered 2013-07-20 – 2013-07-21 (×2): 0.5 mg via ORAL
  Filled 2013-07-18 (×2): qty 1

## 2013-07-18 MED ORDER — CEFAZOLIN SODIUM-DEXTROSE 2-3 GM-% IV SOLR
2.0000 g | Freq: Four times a day (QID) | INTRAVENOUS | Status: DC
  Administered 2013-07-18 – 2013-07-19 (×3): 2 g via INTRAVENOUS
  Filled 2013-07-18 (×5): qty 50

## 2013-07-18 MED ORDER — DOCUSATE SODIUM 100 MG PO CAPS
100.0000 mg | ORAL_CAPSULE | Freq: Two times a day (BID) | ORAL | Status: DC
  Administered 2013-07-18 – 2013-07-21 (×6): 100 mg via ORAL

## 2013-07-18 MED ORDER — LACTATED RINGERS IV SOLN
INTRAVENOUS | Status: DC
  Administered 2013-07-18: 1 via INTRAVENOUS
  Administered 2013-07-18: 1000 mL via INTRAVENOUS

## 2013-07-18 MED ORDER — OXYCODONE HCL 5 MG PO TABS: 5.0000 mg | ORAL_TABLET | Freq: Once | ORAL | Status: DC | PRN

## 2013-07-18 MED ORDER — MENTHOL 3 MG MT LOZG: 1.0000 | LOZENGE | OROMUCOSAL | Status: DC | PRN

## 2013-07-18 MED ORDER — LATANOPROST 0.005 % OP SOLN
1.0000 [drp] | Freq: Every day | OPHTHALMIC | Status: DC
  Administered 2013-07-18 – 2013-07-20 (×3): 1 [drp] via OPHTHALMIC
  Filled 2013-07-18: qty 2.5

## 2013-07-18 MED ORDER — ASPIRIN EC 325 MG PO TBEC
325.0000 mg | DELAYED_RELEASE_TABLET | Freq: Every day | ORAL | Status: DC
  Administered 2013-07-19 – 2013-07-21 (×3): 325 mg via ORAL
  Filled 2013-07-18 (×4): qty 1

## 2013-07-18 MED ORDER — LIDOCAINE HCL (CARDIAC) 20 MG/ML IV SOLN
INTRAVENOUS | Status: DC | PRN
  Administered 2013-07-18: 60 mg via INTRAVENOUS

## 2013-07-18 MED ORDER — HYDROMORPHONE HCL PF 1 MG/ML IJ SOLN
INTRAMUSCULAR | Status: AC
  Filled 2013-07-18: qty 1

## 2013-07-18 MED ORDER — DEXAMETHASONE 6 MG PO TABS
10.0000 mg | ORAL_TABLET | Freq: Every day | ORAL | Status: AC
  Administered 2013-07-19: 10:00:00 10 mg via ORAL
  Filled 2013-07-18: qty 1

## 2013-07-18 MED ORDER — PROPOFOL 10 MG/ML IV BOLUS
INTRAVENOUS | Status: DC | PRN
  Administered 2013-07-18: 150 mg via INTRAVENOUS

## 2013-07-18 MED ORDER — IRBESARTAN 300 MG PO TABS
300.0000 mg | ORAL_TABLET | Freq: Every day | ORAL | Status: DC
  Administered 2013-07-18 – 2013-07-21 (×2): 300 mg via ORAL
  Filled 2013-07-18 (×4): qty 1

## 2013-07-18 MED ORDER — METOCLOPRAMIDE HCL 10 MG PO TABS: 5.0000 mg | ORAL_TABLET | Freq: Three times a day (TID) | ORAL | Status: DC | PRN

## 2013-07-18 MED ORDER — ACETAMINOPHEN 650 MG RE SUPP: 650.0000 mg | Freq: Four times a day (QID) | RECTAL | Status: DC | PRN

## 2013-07-18 MED ORDER — KCL IN DEXTROSE-NACL 20-5-0.9 MEQ/L-%-% IV SOLN
INTRAVENOUS | Status: DC
  Administered 2013-07-18 – 2013-07-19 (×2): via INTRAVENOUS
  Administered 2013-07-19: 100 mL/h via INTRAVENOUS
  Filled 2013-07-18 (×9): qty 1000

## 2013-07-18 MED ORDER — ACETAMINOPHEN 10 MG/ML IV SOLN
1000.0000 mg | Freq: Once | INTRAVENOUS | Status: AC
  Administered 2013-07-18: 1000 mg via INTRAVENOUS
  Filled 2013-07-18: qty 100

## 2013-07-18 MED ORDER — FLEET ENEMA 7-19 GM/118ML RE ENEM: 1.0000 | ENEMA | Freq: Once | RECTAL | Status: AC | PRN

## 2013-07-18 MED ORDER — BISACODYL 10 MG RE SUPP
10.0000 mg | Freq: Every day | RECTAL | Status: DC | PRN
  Administered 2013-07-21: 09:00:00 10 mg via RECTAL
  Filled 2013-07-18: qty 1

## 2013-07-18 MED ORDER — PROPOFOL 10 MG/ML IV BOLUS
INTRAVENOUS | Status: AC
  Filled 2013-07-18: qty 20

## 2013-07-18 MED ORDER — SODIUM CHLORIDE 0.9 % IV SOLN: INTRAVENOUS | Status: DC

## 2013-07-18 MED ORDER — ONDANSETRON HCL 4 MG/2ML IJ SOLN
INTRAMUSCULAR | Status: AC
  Filled 2013-07-18: qty 2

## 2013-07-18 MED ORDER — OXYCODONE HCL 5 MG PO TABS
5.0000 mg | ORAL_TABLET | ORAL | Status: DC | PRN
  Administered 2013-07-19 – 2013-07-20 (×6): 5 mg via ORAL
  Filled 2013-07-18 (×6): qty 1

## 2013-07-18 MED ORDER — ACETAMINOPHEN 325 MG PO TABS: 650.0000 mg | ORAL_TABLET | Freq: Four times a day (QID) | ORAL | Status: DC | PRN

## 2013-07-18 MED ORDER — SODIUM CHLORIDE 0.9 % IR SOLN
Status: DC | PRN
  Administered 2013-07-18: 1000 mL

## 2013-07-18 MED ORDER — ONDANSETRON HCL 4 MG PO TABS: 4.0000 mg | ORAL_TABLET | Freq: Four times a day (QID) | ORAL | Status: DC | PRN

## 2013-07-18 MED ORDER — ONDANSETRON HCL 4 MG/2ML IJ SOLN
INTRAMUSCULAR | Status: DC | PRN
  Administered 2013-07-18: 4 mg via INTRAVENOUS

## 2013-07-18 MED ORDER — DIPHENHYDRAMINE HCL 12.5 MG/5ML PO ELIX: 12.5000 mg | ORAL_SOLUTION | ORAL | Status: DC | PRN

## 2013-07-18 MED ORDER — CEFAZOLIN SODIUM-DEXTROSE 2-3 GM-% IV SOLR
INTRAVENOUS | Status: AC
  Filled 2013-07-18: qty 50

## 2013-07-18 MED ORDER — PROMETHAZINE HCL 25 MG/ML IJ SOLN: 6.2500 mg | INTRAMUSCULAR | Status: DC | PRN

## 2013-07-18 MED ORDER — FENTANYL CITRATE 0.05 MG/ML IJ SOLN
INTRAMUSCULAR | Status: DC | PRN
  Administered 2013-07-18 (×2): 75 ug via INTRAVENOUS
  Administered 2013-07-18 (×2): 50 ug via INTRAVENOUS

## 2013-07-18 MED ORDER — POLYETHYLENE GLYCOL 3350 17 G PO PACK
17.0000 g | PACK | Freq: Every day | ORAL | Status: DC | PRN
  Administered 2013-07-21: 17 g via ORAL

## 2013-07-18 MED ORDER — HYDROMORPHONE HCL PF 1 MG/ML IJ SOLN
0.2500 mg | INTRAMUSCULAR | Status: DC | PRN
  Administered 2013-07-18 (×4): 0.5 mg via INTRAVENOUS

## 2013-07-18 MED ORDER — PHENOL 1.4 % MT LIQD: 1.0000 | OROMUCOSAL | Status: DC | PRN

## 2013-07-18 MED ORDER — CEFAZOLIN SODIUM-DEXTROSE 2-3 GM-% IV SOLR
2.0000 g | INTRAVENOUS | Status: AC
  Administered 2013-07-18: 2 g via INTRAVENOUS

## 2013-07-18 MED ORDER — MORPHINE SULFATE 2 MG/ML IJ SOLN: 1.0000 mg | INTRAMUSCULAR | Status: DC | PRN

## 2013-07-18 SURGICAL SUPPLY — 56 items
BAG SPEC THK2 15X12 ZIP CLS (MISCELLANEOUS) ×1
BAG ZIPLOCK 12X15 (MISCELLANEOUS) ×2 IMPLANT
BANDAGE ELASTIC 6 VELCRO ST LF (GAUZE/BANDAGES/DRESSINGS) ×2 IMPLANT
BANDAGE ESMARK 6X9 LF (GAUZE/BANDAGES/DRESSINGS) ×1 IMPLANT
BLADE SAG 18X100X1.27 (BLADE) ×2 IMPLANT
BLADE SAW SGTL 11.0X1.19X90.0M (BLADE) ×4 IMPLANT
BNDG CMPR 9X6 STRL LF SNTH (GAUZE/BANDAGES/DRESSINGS) ×1
BNDG ESMARK 6X9 LF (GAUZE/BANDAGES/DRESSINGS) ×2
BONE CEMENT GENTAMICIN (Cement) ×4 IMPLANT
CEMENT BONE GENTAMICIN 40 (Cement) ×3 IMPLANT
CONT SPECI 4OZ STER CLIK (MISCELLANEOUS) ×3 IMPLANT
CUFF TOURN SGL QUICK 34 (TOURNIQUET CUFF) ×2
CUFF TRNQT CYL 34X4X40X1 (TOURNIQUET CUFF) ×2 IMPLANT
DRAPE EXTREMITY T 121X128X90 (DRAPE) ×2 IMPLANT
DRAPE POUCH INSTRU U-SHP 10X18 (DRAPES) ×2 IMPLANT
DRAPE U-SHAPE 47X51 STRL (DRAPES) ×2 IMPLANT
DRSG ADAPTIC 3X8 NADH LF (GAUZE/BANDAGES/DRESSINGS) ×2 IMPLANT
DRSG PAD ABDOMINAL 8X10 ST (GAUZE/BANDAGES/DRESSINGS) ×1 IMPLANT
DURAPREP 26ML APPLICATOR (WOUND CARE) ×2 IMPLANT
ELECT REM PT RETURN 9FT ADLT (ELECTROSURGICAL) ×2
ELECTRODE REM PT RTRN 9FT ADLT (ELECTROSURGICAL) ×1 IMPLANT
EVACUATOR 1/8 PVC DRAIN (DRAIN) ×2 IMPLANT
FACESHIELD LNG OPTICON STERILE (SAFETY) ×9 IMPLANT
GLOVE BIO SURGEON STRL SZ7.5 (GLOVE) ×3 IMPLANT
GLOVE BIO SURGEON STRL SZ8 (GLOVE) ×2 IMPLANT
GLOVE BIOGEL PI IND STRL 8 (GLOVE) ×1 IMPLANT
GLOVE BIOGEL PI INDICATOR 8 (GLOVE) ×1
GOWN PREVENTION PLUS LG XLONG (DISPOSABLE) ×2 IMPLANT
GOWN STRL REIN XL XLG (GOWN DISPOSABLE) ×4 IMPLANT
HANDPIECE INTERPULSE COAX TIP (DISPOSABLE) ×2
IMMOBILIZER KNEE 20 (SOFTGOODS) ×2
IMMOBILIZER KNEE 20 THIGH 36 (SOFTGOODS) IMPLANT
KIT BASIN OR (CUSTOM PROCEDURE TRAY) ×2 IMPLANT
MANIFOLD NEPTUNE II (INSTRUMENTS) ×2 IMPLANT
NS IRRIG 1000ML POUR BTL (IV SOLUTION) ×3 IMPLANT
PACK TOTAL JOINT (CUSTOM PROCEDURE TRAY) ×2 IMPLANT
PAD ABD 7.5X8 STRL (GAUZE/BANDAGES/DRESSINGS) ×1 IMPLANT
PADDING CAST ABS 6INX4YD NS (CAST SUPPLIES) ×2
PADDING CAST ABS COTTON 6X4 NS (CAST SUPPLIES) IMPLANT
PADDING CAST COTTON 6X4 STRL (CAST SUPPLIES) ×1 IMPLANT
POSITIONER SURGICAL ARM (MISCELLANEOUS) ×2 IMPLANT
SET HNDPC FAN SPRY TIP SCT (DISPOSABLE) ×1 IMPLANT
SPONGE GAUZE 4X4 12PLY (GAUZE/BANDAGES/DRESSINGS) ×2 IMPLANT
STAPLER VISISTAT 35W (STAPLE) ×2 IMPLANT
SUCTION FRAZIER TIP 10 FR DISP (SUCTIONS) ×2 IMPLANT
SUT VIC AB 1 CT1 27 (SUTURE) ×6
SUT VIC AB 1 CT1 27XBRD ANTBC (SUTURE) ×3 IMPLANT
SUT VIC AB 2-0 CT1 27 (SUTURE) ×6
SUT VIC AB 2-0 CT1 TAPERPNT 27 (SUTURE) ×3 IMPLANT
SWAB COLLECTION DEVICE MRSA (MISCELLANEOUS) ×3 IMPLANT
TOWEL OR 17X26 10 PK STRL BLUE (TOWEL DISPOSABLE) ×4 IMPLANT
TOWER CARTRIDGE SMART MIX (DISPOSABLE) ×2 IMPLANT
TRAY FOLEY CATH 14FRSI W/METER (CATHETERS) ×2 IMPLANT
TUBE ANAEROBIC SPECIMEN COL (MISCELLANEOUS) ×3 IMPLANT
WATER STERILE IRR 1500ML POUR (IV SOLUTION) ×3 IMPLANT
WRAP KNEE MAXI GEL POST OP (GAUZE/BANDAGES/DRESSINGS) ×3 IMPLANT

## 2013-07-18 NOTE — Anesthesia Postprocedure Evaluation (Signed)
Anesthesia Post Note  Patient: Roger Cameron  Procedure(s) Performed: Procedure(s) (LRB): RIGHT KNEE RESECTION ARTHROPLASTY WITH ANTIBIOTIC SPACERS  (Right)  Anesthesia type: General  Patient location: PACU  Post pain: Pain level controlled  Post assessment: Post-op Vital signs reviewed  Last Vitals: BP 140/66  Pulse 57  Temp(Src) 36.7 C (Oral)  Resp 20  Ht 6' (1.829 m)  Wt 177 lb 12.8 oz (80.65 kg)  BMI 24.11 kg/m2  SpO2 99%  Post vital signs: Reviewed  Level of consciousness: sedated  Complications: No apparent anesthesia complications

## 2013-07-18 NOTE — H&P (Signed)
Roger Cameron is an 79 y.o. male.   Chief Complaint: Right knee infection HPI: Roger Cameron is an 79 yo male with a long complex history related to his right knee. He had a right knee unicompartmental replacement in June and had a post-op hematoma due to anticoagulation. He had a subsequent irrigation and debridement on 02/02/13 and did well until October when the swelling recurred necessitating another Irrigation and debridement on 05/17/13. He did not have infection at that time. He presented to the office last week with a painful swollen right knee which upon aspiration, revealed purulent fluid. He presents today for resection arthroplasty and antibiotic spacer placement.  Past Medical History  Diagnosis Date  . Impaired hearing     NO HEARING AIDS  . Anxiety   . Glaucoma     BILATERAL  . Cataract     LEFT EYE  . Hypertension   . GERD (gastroesophageal reflux disease)   . Arthritis   . Infection 07/15/13    RIGHT KNEE INFECTION  S/P RT UNIKNEE REPLACEMENT ON 01/03/13 - STATES HIS KNEE IS VERY SWOLLEN     Past Surgical History  Procedure Laterality Date  . Right knee arthroscopy  x 2    . Hernia repair      RIGHT INGUINAL HERNAI REPAIR  . Tonsillectomy      AS A CHILD  . Eye surgery      RIGHT EYE CATARACT REMOVED  . Partial knee arthroplasty Right 01/03/2013    Procedure: RIGHT KNEE MEDIAL COMPARTMENT UNI COMPARMENTAL ARTHROPLASTY;  Surgeon: Loanne Drilling, MD;  Location: [facility];  Service: Orthopedics;  Laterality: Right;  . Irrigation and debridement knee Right 02/02/2013    Procedure: IRRIGATION AND DEBRIDEMENT KNEE;  Surgeon: Loanne Drilling, MD;  Location: [facility];  Service: Orthopedics;  Laterality: Right;  . Irrigation and debridement knee Right 05/17/2013    Procedure: IRRIGATION AND DEBRIDEMENT RIGHT KNEE;  Surgeon: Loanne Drilling, MD;  Location: [facility];  Service: Orthopedics;  Laterality: Right;  STIMULIN BEADS    History reviewed. No pertinent family history. Social  History:  reports that he has never smoked. He has never used smokeless tobacco. He reports that he drinks alcohol. He reports that he does not use illicit drugs.  Allergies:  Allergies  Allergen Reactions  . Adhesive [Tape] Rash    blisters    Medications Prior to Admission  Medication Sig Dispense Refill  . ALPRAZolam (XANAX) 0.5 MG tablet Take 0.5 mg by mouth daily as needed for anxiety.      [name] atenolol (TENORMIN) 50 MG tablet Take 25 mg by mouth every morning. Takes 1/2 tablet      . hydrochlorothiazide (HYDRODIURIL) 25 MG tablet Take 25 mg by mouth every morning.      . lansoprazole (PREVACID) 30 MG capsule Take 30 mg by mouth daily.       [name] latanoprost (XALATAN) 0.005 % ophthalmic solution Place 1 drop into both eyes at bedtime.      . valsartan (DIOVAN) 320 MG tablet Take 320 mg by mouth every morning.      [name] aspirin 325 MG EC tablet Take 325 mg by mouth every 6 (six) hours as needed (headache).        Results for orders placed during the hospital encounter of 07/18/13 (from the past 48 hour(s))  APTT     Status: None   Collection Time    07/18/13 12:35 PM      Result Value  Range   aPTT 33  24 - 37 seconds  CBC     Status: Abnormal   Collection Time    07/18/13 12:35 PM      Result Value Range   WBC 11.5 (*) 4.0 - 10.5 K/uL   RBC 3.90 (*) 4.22 - 5.81 MIL/uL   Hemoglobin 11.3 (*) 13.0 - 17.0 g/dL   HCT 95.2 (*) 84.1 - 32.4 %   MCV 82.8  78.0 - 100.0 fL   MCH 29.0  26.0 - 34.0 pg   MCHC 35.0  30.0 - 36.0 g/dL   RDW 40.1  02.7 - 25.3 %   Platelets 531 (*) 150 - 400 K/uL  COMPREHENSIVE METABOLIC PANEL     Status: Abnormal   Collection Time    07/18/13 12:35 PM      Result Value Range   Sodium 135  135 - 145 mEq/L   Potassium 4.1  3.5 - 5.1 mEq/L   Chloride 102  96 - 112 mEq/L   CO2 19  19 - 32 mEq/L   Glucose, Bld 82  70 - 99 mg/dL   BUN 12  6 - 23 mg/dL   Creatinine, Ser 6.64  0.50 - 1.35 mg/dL   Calcium 9.6  8.4 - 40.3 mg/dL   Total Protein 7.6  6.0 - 8.3 g/dL    Albumin 3.1 (*) 3.5 - 5.2 g/dL   AST 16  0 - 37 U/L   ALT 16  0 - 53 U/L   Alkaline Phosphatase 95  39 - 117 U/L   Total Bilirubin 0.5  0.3 - 1.2 mg/dL   GFR calc non Af Amer 81 (*) >90 mL/min   GFR calc Af Amer >90  >90 mL/min   Comment: (NOTE)     The eGFR has been calculated using the CKD EPI equation.     This calculation has not been validated in all clinical situations.     eGFR's persistently <90 mL/min signify possible Chronic Kidney     Disease.  PROTIME-INR     Status: Abnormal   Collection Time    07/18/13 12:35 PM      Result Value Range   Prothrombin Time 15.9 (*) 11.6 - 15.2 seconds   INR 1.30  0.00 - 1.49  TYPE AND SCREEN     Status: None   Collection Time    07/18/13 12:35 PM      Result Value Range   ABO/RH(D) O POS     Antibody Screen NEG     Sample Expiration 07/21/2013     No results found.  ROS  Blood pressure 121/64, pulse 61, temperature 97.4 F (36.3 C), temperature source Oral, resp. rate 16, height 6' (1.829 m), weight 177 lb 12.8 oz (80.65 kg), SpO2 100.00%. Physical Exam Physical Examination: General appearance - alert, well appearing, and in no distress Mental status - alert, oriented to person, place, and time Neck - supple, no significant adenopathy Chest - clear to auscultation, no wheezes, rales or rhonchi, symmetric air entry Heart - normal rate, regular rhythm, normal S1, S2, no murmurs, rubs, clicks or gallops Abdomen - soft, nontender, nondistended, no masses or organomegaly Neurological - alert, oriented, normal speech, no focal findings or movement disorder noted Right knee moderately swollen; no warmth or erythema; tender medially; ROM 0-95; effusion present   Assessment/Plan Right knee periprosthetic infection- Plan resection arthroplasty and antibiotic spacer placement. Discussed procedure, risks and potential complications with the patient who elects to proceed.  [name]  V 07/18/2013, 3:52 PM

## 2013-07-18 NOTE — Transfer of Care (Signed)
Immediate Anesthesia Transfer of Care Note  Patient: Roger Cameron  Procedure(s) Performed: Procedure(s): RIGHT KNEE RESECTION ARTHROPLASTY WITH ANTIBIOTIC SPACERS  (Right)  Patient Location: PACU  Anesthesia Type:General  Level of Consciousness: awake, alert  and oriented  Airway & Oxygen Therapy: Patient Spontanous Breathing and Patient connected to face mask oxygen  Post-op Assessment: Report given to PACU RN and Post -op Vital signs reviewed and stable  Post vital signs: Reviewed and stable  Complications: No apparent anesthesia complications

## 2013-07-18 NOTE — Interval H&P Note (Signed)
History and Physical Interval Note:  07/18/2013 3:58 PM  Roger Cameron  has presented today for surgery, with the diagnosis of INFECTED RIGHT KNEE UNICOMPARTAMENTAL REPLACEMENT   The various methods of treatment have been discussed with the patient and family. After consideration of risks, benefits and other options for treatment, the patient has consented to  Procedure(s): RIGHT KNEE RESECTION ARTHROPLASTY WITH ANTIBIOTIC SPACERS  (Right) as a surgical intervention .  The patient's history has been reviewed, patient examined, no change in status, stable for surgery.  I have reviewed the patient's chart and labs.  Questions were answered to the patient's satisfaction.     Loanne Drilling

## 2013-07-18 NOTE — Anesthesia Preprocedure Evaluation (Signed)
Anesthesia Evaluation  Patient identified by MRN, date of birth, ID band Patient awake    Reviewed: Allergy & Precautions, H&P , NPO status , Patient's Chart, lab work & pertinent test results, reviewed documented beta blocker date and time   Airway Mallampati: II TM Distance: >3 FB Neck ROM: Full    Dental no notable dental hx. (+) Dental Advisory Given   Pulmonary neg pulmonary ROS,  breath sounds clear to auscultation  Pulmonary exam normal       Cardiovascular hypertension, Pt. on medications and Pt. on home beta blockers Rhythm:Regular Rate:Normal     Neuro/Psych PSYCHIATRIC DISORDERS Anxiety negative neurological ROS     GI/Hepatic Neg liver ROS, GERD-  Medicated and Controlled,  Endo/Other  negative endocrine ROS  Renal/GU negative Renal ROS     Musculoskeletal negative musculoskeletal ROS (+)   Abdominal   Peds  Hematology  (+) Blood dyscrasia, anemia ,   Anesthesia Other Findings   Reproductive/Obstetrics                           Anesthesia Physical  Anesthesia Plan  ASA: II  Anesthesia Plan: General   Post-op Pain Management:    Induction:   Airway Management Planned: LMA  Additional Equipment:   Intra-op Plan:   Post-operative Plan: Extubation in OR  Informed Consent: I have reviewed the patients History and Physical, chart, labs and discussed the procedure including the risks, benefits and alternatives for the proposed anesthesia with the patient or authorized representative who has indicated his/her understanding and acceptance.   Dental advisory given  Plan Discussed with: CRNA  Anesthesia Plan Comments:         Anesthesia Quick Evaluation

## 2013-07-18 NOTE — Brief Op Note (Signed)
07/18/2013  5:44 PM  PATIENT:  Roger Cameron  79 y.o. male  PRE-OPERATIVE DIAGNOSIS:  INFECTED RIGHT KNEE UNICOMPARTAMENTAL REPLACEMENT   POST-OPERATIVE DIAGNOSIS:   INFECTED RIGHT KNEE UNICOMPARTAMENTAL REPLACEMENT  PROCEDURE:  Procedure(s): RIGHT KNEE RESECTION ARTHROPLASTY WITH ANTIBIOTIC SPACERS  (Right)  SURGEON:  Surgeon(s) and Role:    * Loanne Drilling, MD - Primary  PHYSICIAN ASSISTANT:   ASSISTANTS: Avel Peace, PA-C   ANESTHESIA:   general  EBL:     BLOOD ADMINISTERED:none  DRAINS: (Hemovac) Hemovact drain(s) in the right knee with  Suction Open   LOCAL MEDICATIONS USED:  NONE  SPECIMEN:  No Specimen  COUNTS:  YES  TOURNIQUET:   Total Tourniquet Time Documented: Thigh (Right) - 39 minutes Total: Thigh (Right) - 39 minutes   DICTATION: .Other Dictation: Dictation Number [id number]  PLAN OF CARE: Admit to inpatient   PATIENT DISPOSITION:  PACU - hemodynamically stable.

## 2013-07-19 ENCOUNTER — Encounter (HOSPITAL_COMMUNITY): Payer: Self-pay | Admitting: Orthopedic Surgery

## 2013-07-19 ENCOUNTER — Inpatient Hospital Stay (HOSPITAL_COMMUNITY): Payer: Medicare Other

## 2013-07-19 DIAGNOSIS — Z96659 Presence of unspecified artificial knee joint: Secondary | ICD-10-CM

## 2013-07-19 DIAGNOSIS — T8450XA Infection and inflammatory reaction due to unspecified internal joint prosthesis, initial encounter: Principal | ICD-10-CM

## 2013-07-19 LAB — CBC
HCT: 26.1 % — ABNORMAL LOW (ref 39.0–52.0)
Hemoglobin: 8.9 g/dL — ABNORMAL LOW (ref 13.0–17.0)
MCH: 28.9 pg (ref 26.0–34.0)
MCHC: 34.1 g/dL (ref 30.0–36.0)
MCV: 84.7 fL (ref 78.0–100.0)
Platelets: 384 K/uL (ref 150–400)
RBC: 3.08 MIL/uL — ABNORMAL LOW (ref 4.22–5.81)
RDW: 14.3 % (ref 11.5–15.5)
WBC: 9.6 K/uL (ref 4.0–10.5)

## 2013-07-19 LAB — URINE CULTURE
Colony Count: NO GROWTH
Culture: NO GROWTH

## 2013-07-19 LAB — BASIC METABOLIC PANEL
CO2: 22 mEq/L (ref 19–32)
Calcium: 8.2 mg/dL — ABNORMAL LOW (ref 8.4–10.5)
GFR calc Af Amer: >90 mL/min (ref >90)
GFR calc non Af Amer: 81 mL/min — ABNORMAL LOW (ref >90)
Potassium: 4.2 mEq/L (ref 3.7–5.3)
Sodium: 135 mEq/L — ABNORMAL LOW (ref 137–147)

## 2013-07-19 MED ORDER — CEFAZOLIN SODIUM-DEXTROSE 2-3 GM-% IV SOLR
2.0000 g | Freq: Three times a day (TID) | INTRAVENOUS | Status: DC
  Administered 2013-07-19 – 2013-07-21 (×5): 2 g via INTRAVENOUS
  Filled 2013-07-19 (×7): qty 50

## 2013-07-19 MED ORDER — SODIUM CHLORIDE 0.9 % IJ SOLN
10.0000 mL | INTRAMUSCULAR | Status: DC | PRN
  Administered 2013-07-20 – 2013-07-21 (×3): 10 mL

## 2013-07-19 NOTE — Progress Notes (Signed)
Clinical Social Work Department CLINICAL SOCIAL WORK PLACEMENT NOTE 07/19/2013  Patient:  Roger Cameron, Roger Cameron  Account Number:  [id number] Admit date:  07/18/2013  Clinical Social Worker:  Cori Razor, LCSW  Date/time:  07/19/2013 02:15 PM  Clinical Social Work is seeking post-discharge placement for this patient at the following level of care:   SKILLED NURSING   (*CSW will update this form in Epic as items are completed)   07/19/2013  Patient/family provided with [facility] Department of Clinical Social Work's list of facilities offering this level of care within the geographic area requested by the patient (or if unable, by the patient's family).  07/19/2013  Patient/family informed of their freedom to choose among providers that offer the needed level of care, that participate in Medicare, Medicaid or managed care program needed by the patient, have an available bed and are willing to accept the patient.    Patient/family informed of [facility]' ownership interest in [facility], as well as of the fact that they are under no obligation to receive care at this facility.  PASARR submitted to EDS on 07/19/2013 PASARR number received from [employer] on 07/19/2013  FL2 transmitted to all facilities in geographic area requested by pt/family on  07/19/2013 FL2 transmitted to all facilities within larger geographic area on   Patient informed that his/her managed care company has contracts with or will negotiate with  certain facilities, including the following:     Patient/family informed of bed offers received:  07/19/2013 Patient chooses bed at [facility] Physician recommends and patient chooses bed at    Patient to be transferred to [facility] on   Patient to be transferred to facility by   The following physician request were entered in Epic:   Additional Comments:   Cori Razor LCSW [phone]

## 2013-07-19 NOTE — Evaluation (Signed)
Physical Therapy Evaluation Patient Details Name: Roger Cameron MRN: [id number] DOB: 1928/12/02 Today's Date: 07/19/2013 Time: 4098-1191 PT Time Calculation (min): 13 min  PT Assessment / Plan / Recommendation History of Present Illness  s/p R knee resection arthoplasty and antibiotic spacer  Clinical Impression  Patient is s/p right knee resection arthoplasty with antibiotic spacer surgery resulting in functional limitations due to the deficits listed below (see PT Problem List).  Patient will benefit from skilled PT to increase their independence and safety with mobility to allow discharge to the venue listed below.  Pt with very limited mobility today due to pain however assisted to sitting EOB.    PT Assessment  Patient needs continued PT services    Follow Up Recommendations  SNF    Does the patient have the potential to tolerate intense rehabilitation      Barriers to Discharge        Equipment Recommendations  None recommended by PT    Recommendations for Other Services     Frequency 7X/week    Precautions / Restrictions Precautions Precautions: Fall;Knee Required Braces or Orthoses: Knee Immobilizer - Right Knee Immobilizer - Right: On at all times Restrictions Other Position/Activity Restrictions: WBAT, NO ROM to R knee   Pertinent Vitals/Pain Pt reports 10/10 R knee pain with any movement, repositioned to as comfortable as possible in supine, RN notified of pain, ice packs applied      Mobility  Bed Mobility Bed Mobility: Supine to Sit;Sit to Supine Supine to Sit: 1: +2 Total assist;HOB elevated Supine to Sit: Patient Percentage: 60% Sit to Supine: 1: +2 Total assist;HOB flat Sit to Supine: Patient Percentage: 70% Details for Bed Mobility Assistance: verbal cues for technique, assist and support for R LE at all times due to pain Transfers Transfers: Not assessed (pt reports too much pain to stand)    Exercises     PT Diagnosis: Difficulty  walking;Acute pain  PT Problem List: Decreased strength;Decreased mobility;Pain;Decreased knowledge of precautions PT Treatment Interventions: DME instruction;Gait training;Functional mobility training;Therapeutic activities;Therapeutic exercise;Patient/family education     PT Goals(Current goals can be found in the care plan section) Acute Rehab PT Goals PT Goal Formulation: With patient Time For Goal Achievement: 07/23/13 Potential to Achieve Goals: Good  Visit Information  Last PT Received On: 07/19/13 Assistance Needed: +2 History of Present Illness: s/p R knee resection arthoplasty and antibiotic spacer       Prior Functioning  Home Living Family/patient expects to be discharged to:: Skilled nursing facility Prior Function Level of Independence: Independent with assistive device(s) Communication Communication: No difficulties    Cognition  Cognition Arousal/Alertness: Awake/alert Behavior During Therapy: WFL for tasks assessed/performed Overall Cognitive Status: Within Functional Limits for tasks assessed    Extremity/Trunk Assessment Lower Extremity Assessment Lower Extremity Assessment: RLE deficits/detail RLE Deficits / Details: maintained KI, pt unable to move R LE without assist RLE: Unable to fully assess due to immobilization;Unable to fully assess due to pain   Balance    End of Session PT - End of Session Equipment Utilized During Treatment: Right knee immobilizer Activity Tolerance: Patient limited by pain Patient left: in bed;with call bell/phone within reach Nurse Communication: Patient requests pain meds  GP     Roger Cameron,Roger Cameron 07/19/2013, 12:09 PM [name], PT, DPT 07/19/2013 Pager: [phone]

## 2013-07-19 NOTE — Op Note (Signed)
NAME:  Roger Cameron, Roger Cameron NO.:  [id number]  MEDICAL RECORD NO.:  [id number]  LOCATION:  1619                         FACILITY:  [facility]  PHYSICIAN:  [name], M.D.    DATE OF BIRTH:  1928-12-03  DATE OF PROCEDURE:  07/18/2013 DATE OF DISCHARGE:                              OPERATIVE REPORT   PREOPERATIVE DIAGNOSIS:  Infected right knee unicompartmental replacement.  POSTOPERATIVE DIAGNOSIS:  Infected right knee unicompartmental replacement.  PROCEDURE:  Right knee resection arthroplasty with antibiotic spacer placement.  SURGEON:  [name], MD.  ASSISTANTLauraine Rinne, PAC.  ANESTHESIA:  General.  ESTIMATED BLOOD LOSS:  Minimal.  DRAINS:  Hemovac x1.  COMPLICATIONS:  None.  TOURNIQUET TIME:  35 minutes at 300 mmHg.  CONDITION:  Stable to recovery.  BRIEF CLINICAL NOTE:  Roger Cameron is an 79 year old male with a complex history in regard to his right knee.  He has an infected unicompartmental knee replacement.  He presents now for resection arthroplasty and antibiotic spacer placement.  PROCEDURE IN DETAIL:  After successful administration of general anesthetic, a tourniquet was placed high on his right thigh and his right lower extremity was prepped and draped in the usual sterile fashion.  Extremity was wrapped in an Esmarch, tourniquet inflated to 300 mmHg.  A midline incision was made with a 10 blade through subcutaneous tissue to the extensor mechanism.  Fresh blade was used to make a medial arthrotomy.  Purulent fluid was countered.  Sent for stat Gram stain, C and S, aerobic and anaerobic cultures.  We then gave 2 g of intravenous Ancef after the cultures were sent.  We then did a thorough synovectomy removing all the abnormal appearing tissue.  Soft tissue on the proximal medial tibia was then subperiosteally elevated to the joint line with a knife into the semimembranosus bursa with a Cobb elevator.  Soft tissue  laterally was elevated with attention being paid to avoid the patellar tendon or the tibial tubercle.  We everted the patella, flexed the knee 90 degrees.  ACL and PCL were removed.  The hemiarthroplasty head loosened.  I was able to easily remove the femoral and tibial components with minimal bone loss.  The remainder of the articular surface showed significant cartilage damage.  I decided to make the cuts for primary knee replacement and then placed a spacer in order to get excellent antibiotic penetration into the bone and to remove the dead cartilage.  Drill was used to create a starting hole and the distal femur canal was thoroughly irrigated.  A 5-degree right valgus alignment guide was placed and the block was pinned to remove 10 mm off the distal femur. Resection was made with an oscillating saw.  The tibia was then subluxed forward and the meniscal remnant removed.  The extramedullary tibial alignment guide was placed referencing proximally at the medial aspect of tibial tubercle and distally along the second metatarsal axis and tibial crest.  The block is pinned to come just under the previous cut from the unicompartment replacement.  Resection was made with an oscillating saw.  We then placed the sizing block on the femoral side, size 5 was most appropriate.  The block was pinned with the rotation matching the epicondylar axis.  The anterior and posterior chamfer cuts were made.  I then everted the patella and resected the articular surface off the patella.  Thorough synovectomy was completed.  The wound was thoroughly irrigated with 4 L of saline using pulsatile lavage. Gentamicin impregnated cement was mixed and once ready to be formulated, I made a spacer to fit the extension space and give him a stable extension space.  Once it was hardened, the spacer was placed.  Excess remnants were made into the antibiotic beads and placed into the joint. The wound was further  irrigated with a saline.  Hemovac drain was placed in deep tissues, and the arthrotomy closed over the drain with a running #1 V-Loc suture.  Tourniquet was released at total time of 39 minutes. The subcutaneous was then closed over a second limb of the Hemovac drain with interrupted 2-0 Vicryl and skin closed with staples.  Drains were hooked to suction.  Incision cleaned and dried and a bulky sterile dressing applied.  He was placed into a knee immobilizer, awakened, and transported to recovery in stable condition.  Note that, a surgical assistance was a medical necessity for this procedure in order to provide adequate exposure to safely remove the infected tissue without doing any neurovascular damage.  Also, important to have the assistant to help retract in order to appropriately place the antibiotic spacer.     [name], M.D.     FA/MEDQ  D:  07/18/2013  T:  07/19/2013  Job:  [id number]

## 2013-07-19 NOTE — Progress Notes (Signed)
OT Cancellation Note  Patient Details Name: Roger Cameron MRN: [id number] DOB: Sep 26, 1928   Cancelled Treatment:     Noted plan is for SNF  Will defer OT eval to that venue.   [name] 07/19/2013, 12:45 PM Marica Otter, OTR/L [phone] 07/19/2013

## 2013-07-19 NOTE — Progress Notes (Signed)
Clinical Social Work Department BRIEF PSYCHOSOCIAL ASSESSMENT 07/19/2013  Patient:  BRIEN, LOWE     Account Number:  [id number]     Admit date:  07/18/2013  Clinical Social Worker:  Candie Chroman  Date/Time:  07/19/2013 01:55 PM  Referred by:  Physician  Date Referred:  07/19/2013 Referred for  SNF Placement   Other Referral:   Interview type:  Patient Other interview type:    PSYCHOSOCIAL DATA Living Status:  ALONE Admitted from facility:   Level of care:   Primary support name:  Jerrel Ivory Primary support relationship to patient:  FRIEND Degree of support available:   unclear    CURRENT CONCERNS Current Concerns  Post-Acute Placement   Other Concerns:    SOCIAL WORK ASSESSMENT / PLAN Pt is an 79 yr old gentleman living at home prior to hospitalization. CSW met with pt to assist with d/c planning. ST Rehab is needed following hospital d/c. SNF search has been initiated and bed offers to be provided. CSW will continue to follow to assist with d/c planning.   Assessment/plan status:  Psychosocial Support/Ongoing Assessment of Needs Other assessment/ plan:   Information/referral to community resources:   SNF list provided. Insurance coverage for SNF reviewed.    PATIENT'S/FAMILY'S RESPONSE TO PLAN OF CARE: " The doctor said I needed rehab. Guess I better go. "    Cori Razor LCSW [phone]

## 2013-07-19 NOTE — Care Management Note (Signed)
    Page 1 of 1   07/19/2013     10:56:44 AM   CARE MANAGEMENT NOTE 07/19/2013  Patient:  Roger Cameron, Roger Cameron   Account Number:  [id number]  Date Initiated:  07/19/2013  Documentation initiated by:  Colleen Can  Subjective/Objective Assessment:   DX INFECTED RT KNEE UNICOMPARTMENTAL REPLACEMNT; RT KNEE RESECTION ARTHROPLASTY WITH ABX SPACER.     Action/Plan:   CM spoke with patient. Plans are for patient to go to SNF rehab when discharged from hospital.Pt states he lives alone.   Anticipated DC Date:  06/20/2013   Anticipated DC Plan:  SKILLED NURSING FACILITY  In-house referral  Clinical Social Worker      DC Planning Services  CM consult      Choice offered to / List presented to:             Status of service:  Completed, signed off Medicare Important Message given?   (If response is "NO", the following Medicare IM given date fields will be blank) Date Medicare IM given:   Date Additional Medicare IM given:    Discharge Disposition:    Per UR Regulation:    If discussed at Long Length of Stay Meetings, dates discussed:    Comments:

## 2013-07-19 NOTE — Progress Notes (Signed)
Hemovac output from 1915 to 2200 was a total of 420; HVC was clamped from 2300 to 0300; after 4 hours, it was unclamped and recharged again. FYI.

## 2013-07-19 NOTE — Progress Notes (Signed)
Peripherally Inserted Central Catheter/Midline Placement  The IV Nurse has discussed with the patient and/or persons authorized to consent for the patient, the purpose of this procedure and the potential benefits and risks involved with this procedure.  The benefits include less needle sticks, lab draws from the catheter and patient may be discharged home with the catheter.  Risks include, but not limited to, infection, bleeding, blood clot (thrombus formation), and puncture of an artery; nerve damage and irregular heat beat.  Alternatives to this procedure were also discussed.  PICC/Midline Placement Documentation  PICC / Midline Single Lumen 07/19/13 PICC Right Cephalic 43 cm 0 cm (Active)       Christeen Douglas 07/19/2013, 8:44 PM

## 2013-07-19 NOTE — Progress Notes (Signed)
Subjective: 1 Day Post-Op Procedure(s) (LRB): RIGHT KNEE RESECTION ARTHROPLASTY WITH ANTIBIOTIC SPACERS  (Right) Patient reports pain as moderate.   Patient seen in rounds with Dr. Lequita Halt. Patient is well, but has had some minor complaints of pain in the knee, requiring pain medications We will start therapy today.  Plan is to go Skilled nursing facility after hospital stay.  Objective: Vital signs in last 24 hours: Temp:  [97.3 F (36.3 C)-98.1 F (36.7 C)] 97.7 F (36.5 C) (12/30 0530) Pulse Rate:  [51-64] 61 (12/30 0734) Resp:  [14-20] 14 (12/30 0530) BP: (111-170)/(62-76) 120/69 mmHg (12/30 0734) SpO2:  [96 %-100 %] 100 % (12/30 0530) Weight:  [80.65 kg (177 lb 12.8 oz)] 80.65 kg (177 lb 12.8 oz) (12/29 1212)  Intake/Output from previous day:  Intake/Output Summary (Last 24 hours) at 07/19/13 0909 Last data filed at 07/19/13 0537  Gross per 24 hour  Intake   1780 ml  Output    930 ml  Net    850 ml    Intake/Output this shift:    Labs:  Recent Labs  07/18/13 1235 07/19/13 0518  HGB 11.3* 8.9*    Recent Labs  07/18/13 1235 07/19/13 0518  WBC 11.5* 9.6  RBC 3.90* 3.08*  HCT 32.3* 26.1*  PLT 531* 384    Recent Labs  07/18/13 1235 07/19/13 0518  NA 135 135*  K 4.1 4.2  CL 102 104  CO2 19 22  BUN 12 13  CREATININE 0.81 0.79  GLUCOSE 82 112*  CALCIUM 9.6 8.2*    Recent Labs  07/18/13 1235  INR 1.30    EXAM General - Patient is Alert and Appropriate Extremity - Neurovascular intact Sensation intact distally Dressing - dressing C/D/I Motor Function - intact, moving foot and toes well on exam.  Hemovacs left in place today..  Past Medical History  Diagnosis Date  . Impaired hearing     NO HEARING AIDS  . Anxiety   . Glaucoma     BILATERAL  . Cataract     LEFT EYE  . Hypertension   . GERD (gastroesophageal reflux disease)   . Arthritis   . Infection 07/15/13    RIGHT KNEE INFECTION  S/P RT UNIKNEE REPLACEMENT ON 01/03/13 -  STATES HIS KNEE IS VERY SWOLLEN     Assessment/Plan: 1 Day Post-Op Procedure(s) (LRB): RIGHT KNEE RESECTION ARTHROPLASTY WITH ANTIBIOTIC SPACERS  (Right) Active Problems:   Septic arthritis of knee  Estimated body mass index is 24.11 kg/(m^2) as calculated from the following:   Height as of this encounter: 6' (1.829 m).   Weight as of this encounter: 80.65 kg (177 lb 12.8 oz). Advance diet Up with therapy Discharge to SNF I.D. Consult PICC line ordered  DVT Prophylaxis - Aspirin Weight-Bearing as tolerated to right leg - Cement space in the knee - NO BENDING OR MOTION TO THE KNEE D/C O2 and Pulse OX and try on Room Air  I.D. Consult History: 01-30-2012 Right Knee Arthroscopy for right knee medial meniscal tear. 01-03-2013 Right Knee Unicompartmental Replacement 01-06-2013 POD 3 - moderate bleeding postop. 01-13-2013 POD 10 - continued bleeding, hematoma - Stopped Xarelto. 01-18-2013 POD 15 - continued bleeding, started on Keflex PO 01-27-2013 - second round of Keflex PO 02-02-2013 Right Knee I&D, NO growth from OR cultures. 04-15-2013 Swelling returned, office aspirate (clotted), no growth from aspirate. 05-10-2013 Concerned about swelling, labs drawn  Elevated WBC 11.1  Elevated Sed Rate 138  Elevated CRP 15.5 05-17-2013 Right Knee I&D,  No growth from OR cultures, sent home on Augmentin and Doxy 06-28-2013 Swelling returned after being off oral antibiotics for approximately one week. 07-12-2013 Aspirate and found purulent drainage. 07-18-2013 RIGHT KNEE RESECTION ARTHROPLASTY WITH ANTIBIOTIC SPACERS   Aspirate from the office taken on 12-23 showed coag negative staph with pan sensitivity. New cultures taken from surgery yesterday.   Roger Cameron 07/19/2013, 9:09 AM

## 2013-07-19 NOTE — Progress Notes (Signed)
Utilization review completed.  

## 2013-07-19 NOTE — Consult Note (Addendum)
[facility]     Reason for Consult: Prosthetic knee infection    Referring Physician: Dr. Despina Hick  Active Problems:   Septic arthritis of knee   . acetaminophen  1,000 mg Oral Q6H  . aspirin EC  325 mg Oral Q breakfast  . atenolol  25 mg Oral QAC breakfast  .  ceFAZolin (ANCEF) IV  2 g Intravenous Q6H  . docusate sodium  100 mg Oral BID  . hydrochlorothiazide  25 mg Oral QAC breakfast  . irbesartan  300 mg Oral Daily  . latanoprost  1 drop Both Eyes QHS  . pantoprazole  40 mg Oral Daily    Recommendations: Ancef 2 grams IV for 6 weeks through February 9th (I have changed dose to 2 grams every 8 hours) Weekly cbc, cmp to [facility] We will arrange follow up with [facility] in 2-3 weeks I will check baseline CRP, ESR  Thanks for the consult    Assessment: He has had swelling of his knee and recent aspiration with purulent fluid and a positive CoNS (oxacillin sensitive by report) and now with a resection arthroplasty.    Antibiotics: Ancef started perioperatively after cultures  HPI: Roger Cameron is a 79 y.o. male with right knee arthroscopy, replacement in June 2014, post-op hematoma, irrigation and debridement in July 2014, swelling again in October with I and D (no infection) and came in now after aspiration in office noted purulence with CoNS growth.  He underwent resection arthroplasty 07/18/13 with antibiotic spacer placement.  No fever or chills.  Lives alone but to go to [facility] place for about 1 week.     Review of Systems: A comprehensive review of systems was negative.  Past Medical History  Diagnosis Date  . Impaired hearing     NO HEARING AIDS  . Anxiety   . Glaucoma     BILATERAL  . Cataract     LEFT EYE  . Hypertension   . GERD (gastroesophageal reflux disease)   . Arthritis   . Infection 07/15/13    RIGHT KNEE INFECTION  S/P RT UNIKNEE REPLACEMENT ON 01/03/13 - STATES HIS KNEE IS VERY SWOLLEN     History  Substance Use Topics   . Smoking status: Never Smoker   . Smokeless tobacco: Never Used  . Alcohol Use: Yes     Comment: R[employer]    History reviewed. No pertinent family history. Allergies  Allergen Reactions  . Adhesive [Tape] Rash    blisters    OBJECTIVE: Blood pressure 144/70, pulse 66, temperature 98 F (36.7 C), temperature source Oral, resp. rate 16, height 6' (1.829 m), weight 177 lb 12.8 oz (80.65 kg), SpO2 100.00%. General: Awake, alert, nad Skin: no rashes Lungs: CTA B Cor: RRR without m Abdomen: soft, nt, nd Ext: right leg wrapped  Microbiology: Recent Results (from the past 240 hour(s))  ANAEROBIC CULTURE     Status: None   Collection Time    07/18/13  5:04 PM      Result Value Range Status   Specimen Description SYNOVIAL RIGHT KNEE   Final   Special Requests NONE   Final   Gram Stain     Final   Value: FEW WBC PRESENT,BOTH PMN AND MONONUCLEAR     NO ORGANISMS SEEN     Performed at [facility]   Culture     Final   Value: NO ANAEROBES ISOLATED; CULTURE IN PROGRESS FOR 5 DAYS  Performed at [facility]   Report Status PENDING   Incomplete  BODY FLUID CULTURE     Status: None   Collection Time    07/18/13  5:04 PM      Result Value Range Status   Specimen Description SYNOVIAL RIGHT KNEE   Final   Special Requests NONE   Final   Gram Stain     Final   Value: FEW WBC PRESENT,BOTH PMN AND MONONUCLEAR     NO ORGANISMS SEEN     Performed at [facility]   Culture     Final   Value: NO GROWTH     Performed at [facility]   Report Status PENDING   Incomplete    Geroge Gilliam, Molly Maduro, MD [email or web address] [phone] pager  [phone] cell 07/19/2013, 2:48 PM

## 2013-07-20 LAB — BASIC METABOLIC PANEL
BUN: 10 mg/dL (ref 6–23)
CO2: 23 mEq/L (ref 19–32)
Calcium: 8.3 mg/dL — ABNORMAL LOW (ref 8.4–10.5)
Creatinine, Ser: 0.68 mg/dL (ref 0.50–1.35)
GFR calc Af Amer: >90 mL/min (ref >90)
GFR calc non Af Amer: 87 mL/min — ABNORMAL LOW (ref >90)
Glucose, Bld: 161 mg/dL — ABNORMAL HIGH (ref 70–99)
Sodium: 136 mEq/L — ABNORMAL LOW (ref 137–147)

## 2013-07-20 LAB — CBC
Hemoglobin: 8.8 g/dL — ABNORMAL LOW (ref 13.0–17.0)
MCH: 28.5 pg (ref 26.0–34.0)
MCHC: 33.5 g/dL (ref 30.0–36.0)
MCV: 85.1 fL (ref 78.0–100.0)
RBC: 3.09 MIL/uL — ABNORMAL LOW (ref 4.22–5.81)
RDW: 14.5 % (ref 11.5–15.5)

## 2013-07-20 MED ORDER — BISACODYL 10 MG RE SUPP: 10.0000 mg | Freq: Every day | RECTAL | Status: DC | PRN

## 2013-07-20 MED ORDER — METHOCARBAMOL 500 MG PO TABS: 500.0000 mg | ORAL_TABLET | Freq: Four times a day (QID) | ORAL | Status: DC | PRN

## 2013-07-20 MED ORDER — POLYETHYLENE GLYCOL 3350 17 G PO PACK: 17.0000 g | PACK | Freq: Every day | ORAL | Status: AC | PRN

## 2013-07-20 MED ORDER — METOCLOPRAMIDE HCL 5 MG PO TABS: 5.0000 mg | ORAL_TABLET | Freq: Three times a day (TID) | ORAL | Status: DC | PRN

## 2013-07-20 MED ORDER — ASPIRIN 325 MG PO TBEC: 325.0000 mg | DELAYED_RELEASE_TABLET | Freq: Every day | ORAL | Status: AC

## 2013-07-20 MED ORDER — TRAMADOL HCL 50 MG PO TABS: 50.0000 mg | ORAL_TABLET | Freq: Four times a day (QID) | ORAL | Status: DC | PRN

## 2013-07-20 MED ORDER — DSS 100 MG PO CAPS: 100.0000 mg | ORAL_CAPSULE | Freq: Two times a day (BID) | ORAL | Status: DC

## 2013-07-20 MED ORDER — OXYCODONE HCL 5 MG PO TABS: 5.0000 mg | ORAL_TABLET | ORAL | Status: DC | PRN

## 2013-07-20 MED ORDER — ONDANSETRON HCL 4 MG PO TABS: 4.0000 mg | ORAL_TABLET | Freq: Four times a day (QID) | ORAL | Status: DC | PRN

## 2013-07-20 MED ORDER — ACETAMINOPHEN 325 MG PO TABS: 650.0000 mg | ORAL_TABLET | Freq: Four times a day (QID) | ORAL | Status: DC | PRN

## 2013-07-20 MED ORDER — CEFAZOLIN SODIUM-DEXTROSE 2-3 GM-% IV SOLR: 2.0000 g | Freq: Three times a day (TID) | INTRAVENOUS | Status: AC

## 2013-07-20 NOTE — Progress Notes (Signed)
Physical Therapy Treatment Patient Details Name: Roger Cameron MRN: [id number] DOB: [birth year] Today's Date: 07/20/2013 Time: 2130-8657 PT Time Calculation (min): 14 min  PT Assessment / Plan / Recommendation  History of Present Illness s/p R knee resection arthoplasty and antibiotic spacer   PT Comments   Pt premedicated prior to session and able to tolerate mobility better today.  Pt ambulated in hallway and assisted to recliner.     Follow Up Recommendations  SNF     Does the patient have the potential to tolerate intense rehabilitation     Barriers to Discharge        Equipment Recommendations  None recommended by PT    Recommendations for Other Services    Frequency 7X/week   Progress towards PT Goals Progress towards PT goals: Progressing toward goals  Plan Current plan remains appropriate    Precautions / Restrictions Precautions Precautions: Fall;Knee Required Braces or Orthoses: Knee Immobilizer - Right Knee Immobilizer - Right: On at all times Restrictions Other Position/Activity Restrictions: WBAT, NO ROM to R knee   Pertinent Vitals/Pain Pt reports moderate pain with movement however states much better today then yesterday, premedicated, repositioned   Mobility  Bed Mobility Bed Mobility: Supine to Sit Supine to Sit: 4: Min assist;HOB elevated Details for Bed Mobility Assistance: verbal cues for technique, pt stated he liked technique yesterday of keeping LEs together over EOB however required assist/support for R LE Transfers Transfers: Sit to Stand;Stand to Sit Sit to Stand: 3: Mod assist;From bed;With upper extremity assist Stand to Sit: 3: Mod assist;To chair/3-in-1;With upper extremity assist Details for Transfer Assistance: verbal cues for safe technique, assist to rise and control descent, +2 for safety Ambulation/Gait Ambulation/Gait Assistance: 4: Min assist Ambulation Distance (Feet): 80 Feet Assistive device: Rolling walker Ambulation/Gait  Assistance Details: verbal cues for technique, increased use of UEs through RW to assist with R LE pain Gait Pattern: Step-to pattern;Antalgic Gait velocity: decr    Exercises     PT Diagnosis:    PT Problem List:   PT Treatment Interventions:     PT Goals (current goals can now be found in the care plan section)    Visit Information  Last PT Received On: 07/20/13 Assistance Needed: +2 History of Present Illness: s/p R knee resection arthoplasty and antibiotic spacer    Subjective Data      Cognition  Cognition Arousal/Alertness: Awake/alert Behavior During Therapy: WFL for tasks assessed/performed Overall Cognitive Status: Within Functional Limits for tasks assessed    Balance     End of Session PT - End of Session Equipment Utilized During Treatment: Right knee immobilizer;Gait belt Activity Tolerance: Patient limited by pain Patient left: in bed;with call bell/phone within reach;with family/visitor present   GP     Roger Cameron,[name] 07/20/2013, 12:47 PM [name], PT, DPT 07/20/2013 Pager: [phone]

## 2013-07-20 NOTE — Progress Notes (Signed)
   Subjective: 2 Days Post-Op Procedure(s) (LRB): RIGHT KNEE RESECTION ARTHROPLASTY WITH ANTIBIOTIC SPACERS  (Right) Patient reports pain as mild.   Patient seen in rounds for Dr. Lequita Halt. Family in room. Patient is well, and has had no acute complaints or problems Plan is to go Skilled nursing facility after hospital stay.  Objective: Vital signs in last 24 hours: Temp:  [97.5 F (36.4 C)-98 F (36.7 C)] 97.5 F (36.4 C) (12/31 0827) Pulse Rate:  [61-73] 61 (12/31 0827) Resp:  [14-16] 15 (12/31 0827) BP: (110-144)/(64-71) 115/69 mmHg (12/31 0827) SpO2:  [97 %-100 %] 97 % (12/31 0533)  Intake/Output from previous day:  Intake/Output Summary (Last 24 hours) at 07/20/13 1414 Last data filed at 07/20/13 1100  Gross per 24 hour  Intake   1340 ml  Output   3510 ml  Net  -2170 ml    Intake/Output this shift: Total I/O In: -  Out: 500 [Urine:500]  Labs:  Recent Labs  07/18/13 1235 07/19/13 0518 07/20/13 0500  HGB 11.3* 8.9* 8.8*    Recent Labs  07/19/13 0518 07/20/13 0500  WBC 9.6 12.8*  RBC 3.08* 3.09*  HCT 26.1* 26.3*  PLT 384 415*    Recent Labs  07/19/13 0518 07/20/13 0500  NA 135* 136*  K 4.2 4.2  CL 104 104  CO2 22 23  BUN 13 10  CREATININE 0.79 0.68  GLUCOSE 112* 161*  CALCIUM 8.2* 8.3*    Recent Labs  07/18/13 1235  INR 1.30    EXAM General - Patient is Alert, Appropriate and Oriented Extremity - Neurovascular intact Sensation intact distally Dressing/Incision - clean, dry, no drainage, healing, Hemovacs patent Motor Function - intact, moving foot and toes well on exam.   Past Medical History  Diagnosis Date  . Impaired hearing     NO HEARING AIDS  . Anxiety   . Glaucoma     BILATERAL  . Cataract     LEFT EYE  . Hypertension   . GERD (gastroesophageal reflux disease)   . Arthritis   . Infection 07/15/13    RIGHT KNEE INFECTION  S/P RT UNIKNEE REPLACEMENT ON 01/03/13 - STATES HIS KNEE IS VERY SWOLLEN      Assessment/Plan: 2 Days Post-Op Procedure(s) (LRB): RIGHT KNEE RESECTION ARTHROPLASTY WITH ANTIBIOTIC SPACERS  (Right) Active Problems:   Septic arthritis of knee  Estimated body mass index is 24.11 kg/(m^2) as calculated from the following:   Height as of this encounter: 6' (1.829 m).   Weight as of this encounter: 80.65 kg (177 lb 12.8 oz). Up with therapy Continue ABX therapy due to infection  DVT Prophylaxis - Aspirin Weight-Bearing as tolerated to right leg - Cement space in the knee - NO BENDING OR MOTION TO THE KNEE  Aspirate from the office taken on 12-23 showed coag negative staph with pan sensitivity.  Gram Stain for OR shows FEW STAPHYLOCOCCUS SPECIES  Marceil Welp 07/20/2013, 2:14 PM

## 2013-07-20 NOTE — Discharge Summary (Signed)
Physician Discharge Summary   Patient ID: Roger Cameron MRN: [id number] DOB/AGE: 01-05-1929 79 y.o.  Admit date: 07/18/2013 Discharge date:  Tentative Date of Discharge - Thursday - 07/21/2013  Primary Diagnosis:  Infected right knee unicompartmental  Admission Diagnoses:  Past Medical History  Diagnosis Date  . Impaired hearing     NO HEARING AIDS  . Anxiety   . Glaucoma     BILATERAL  . Cataract     LEFT EYE  . Hypertension   . GERD (gastroesophageal reflux disease)   . Arthritis   . Infection 07/15/13    RIGHT KNEE INFECTION  S/P RT UNIKNEE REPLACEMENT ON 01/03/13 - STATES HIS KNEE IS VERY SWOLLEN    Discharge Diagnoses:   Active Problems:   Septic arthritis of knee  Estimated body mass index is 24.11 kg/(m^2) as calculated from the following:   Height as of this encounter: 6' (1.829 m).   Weight as of this encounter: 80.65 kg (177 lb 12.8 oz).  Procedure:  Procedure(s) (LRB): RIGHT KNEE RESECTION ARTHROPLASTY WITH ANTIBIOTIC SPACERS  (Right)   Consults: ID  HPI: Mr. Samek is an 79 year old male with a complex  history in regard to his right knee. He has an infected  unicompartmental knee replacement. He presents now for resection  arthroplasty and antibiotic spacer placement.  Laboratory Data: Admission on 07/18/2013  Component Date Value Range Status  . aPTT 07/18/2013 33  24 - 37 seconds Final  . WBC 07/18/2013 11.5* 4.0 - 10.5 K/uL Final  . RBC 07/18/2013 3.90* 4.22 - 5.81 MIL/uL Final  . Hemoglobin 07/18/2013 11.3* 13.0 - 17.0 g/dL Final  . HCT 78/29/5621 32.3* 39.0 - 52.0 % Final  . MCV 07/18/2013 82.8  78.0 - 100.0 fL Final  . MCH 07/18/2013 29.0  26.0 - 34.0 pg Final  . MCHC 07/18/2013 35.0  30.0 - 36.0 g/dL Final  . RDW 30/86/5784 14.2  11.5 - 15.5 % Final  . Platelets 07/18/2013 531* 150 - 400 K/uL Final  . Sodium 07/18/2013 135  135 - 145 mEq/L Final  . Potassium 07/18/2013 4.1  3.5 - 5.1 mEq/L Final  . Chloride 07/18/2013 102  96 - 112 mEq/L  Final  . CO2 07/18/2013 19  19 - 32 mEq/L Final  . Glucose, Bld 07/18/2013 82  70 - 99 mg/dL Final  . BUN 69/62/9528 12  6 - 23 mg/dL Final  . Creatinine, Ser 07/18/2013 0.81  0.50 - 1.35 mg/dL Final  . Calcium 41/32/4401 9.6  8.4 - 10.5 mg/dL Final  . Total Protein 07/18/2013 7.6  6.0 - 8.3 g/dL Final  . Albumin 02/72/5366 3.1* 3.5 - 5.2 g/dL Final  . AST 44/09/4740 16  0 - 37 U/L Final  . ALT 07/18/2013 16  0 - 53 U/L Final  . Alkaline Phosphatase 07/18/2013 95  39 - 117 U/L Final  . Total Bilirubin 07/18/2013 0.5  0.3 - 1.2 mg/dL Final  . GFR calc non Af Amer 07/18/2013 81* >90 mL/min Final  . GFR calc Af Amer 07/18/2013 >90  >90 mL/min Final   Comment: (NOTE)                          The eGFR has been calculated using the CKD EPI equation.                          This calculation has not been validated in all clinical  situations.                          eGFR's persistently <90 mL/min signify possible Chronic Kidney                          Disease.  [name] Prothrombin Time 07/18/2013 15.9* 11.6 - 15.2 seconds Final  . INR 07/18/2013 1.30  0.00 - 1.49 Final  . ABO/RH(D) 07/18/2013 O POS   Final  . Antibody Screen 07/18/2013 NEG   Final  . Sample Expiration 07/18/2013 07/21/2013   Final  . Specimen Description 07/18/2013 SYNOVIAL RIGHT KNEE   Final  . Special Requests 07/18/2013 NONE   Final  . Gram Stain 07/18/2013    Final                   Value:FEW WBC PRESENT,BOTH PMN AND MONONUCLEAR                         NO ORGANISMS SEEN                         Performed at [facility]  . Culture 07/18/2013    Final                   Value:NO ANAEROBES ISOLATED; CULTURE IN PROGRESS FOR 5 DAYS                         Performed at [facility]  . Report Status 07/18/2013 PENDING   Incomplete  . Specimen Description 07/18/2013 SYNOVIAL RIGHT KNEE   Final  . Special Requests 07/18/2013 NONE   Final  . Gram Stain 07/18/2013    Final                   Value:FEW WBC PRESENT,BOTH  PMN AND MONONUCLEAR                         NO ORGANISMS SEEN                         Performed at [facility]  . Culture 07/18/2013    Final                   Value:FEW STAPHYLOCOCCUS SPECIES                         Note: CRITICAL RESULT CALLED TO, READ BACK BY AND VERIFIED WITH: AMY F BY INGRAM A 12/31 1P                         Performed at [facility]  . Report Status 07/18/2013 PENDING   Incomplete  . Specimen Description 07/18/2013 URINE, CATHETERIZED   Final  . Special Requests 07/18/2013 NONE   Final  . Culture  Setup Time 07/18/2013    Final                   Value:07/19/2013 01:38                         Performed at [facility]  . Colony Count 07/18/2013    Final  Value:NO GROWTH                         Performed at [facility]  . Culture 07/18/2013    Final                   Value:NO GROWTH                         Performed at [facility]  . Report Status 07/18/2013 07/19/2013 FINAL   Final  . WBC 07/19/2013 9.6  4.0 - 10.5 K/uL Final  . RBC 07/19/2013 3.08* 4.22 - 5.81 MIL/uL Final  . Hemoglobin 07/19/2013 8.9* 13.0 - 17.0 g/dL Final   Comment: REPEATED TO VERIFY                          DELTA CHECK NOTED  . HCT 07/19/2013 26.1* 39.0 - 52.0 % Final  . MCV 07/19/2013 84.7  78.0 - 100.0 fL Final  . MCH 07/19/2013 28.9  26.0 - 34.0 pg Final  . MCHC 07/19/2013 34.1  30.0 - 36.0 g/dL Final  . RDW 60/45/4098 14.3  11.5 - 15.5 % Final  . Platelets 07/19/2013 384  150 - 400 K/uL Final   Comment: REPEATED TO VERIFY                          DELTA CHECK NOTED  . Sodium 07/19/2013 135* 137 - 147 mEq/L Final  . Potassium 07/19/2013 4.2  3.7 - 5.3 mEq/L Final  . Chloride 07/19/2013 104  96 - 112 mEq/L Final  . CO2 07/19/2013 22  19 - 32 mEq/L Final  . Glucose, Bld 07/19/2013 112* 70 - 99 mg/dL Final  . BUN 11/91/4782 13  6 - 23 mg/dL Final  . Creatinine, Ser 07/19/2013 0.79  0.50 - 1.35 mg/dL Final  . Calcium  95/62/1308 8.2* 8.4 - 10.5 mg/dL Final  . GFR calc non Af Amer 07/19/2013 81* >90 mL/min Final  . GFR calc Af Amer 07/19/2013 >90  >90 mL/min Final   Comment: (NOTE)                          The eGFR has been calculated using the CKD EPI equation.                          This calculation has not been validated in all clinical situations.                          eGFR's persistently <90 mL/min signify possible Chronic Kidney                          Disease.  . WBC 07/20/2013 12.8* 4.0 - 10.5 K/uL Final  . RBC 07/20/2013 3.09* 4.22 - 5.81 MIL/uL Final  . Hemoglobin 07/20/2013 8.8* 13.0 - 17.0 g/dL Final  . HCT 65/78/4696 26.3* 39.0 - 52.0 % Final  . MCV 07/20/2013 85.1  78.0 - 100.0 fL Final  . MCH 07/20/2013 28.5  26.0 - 34.0 pg Final  . MCHC 07/20/2013 33.5  30.0 - 36.0 g/dL Final  . RDW 29/52/8413 14.5  11.5 - 15.5 % Final  . Platelets 07/20/2013 415* 150 - 400 K/uL  Final  . Sodium 07/20/2013 136* 137 - 147 mEq/L Final   Please note change in reference range.  . Potassium 07/20/2013 4.2  3.7 - 5.3 mEq/L Final   Please note change in reference range.  . Chloride 07/20/2013 104  96 - 112 mEq/L Final  . CO2 07/20/2013 23  19 - 32 mEq/L Final  . Glucose, Bld 07/20/2013 161* 70 - 99 mg/dL Final  . BUN 16/04/9603 10  6 - 23 mg/dL Final  . Creatinine, Ser 07/20/2013 0.68  0.50 - 1.35 mg/dL Final  . Calcium 54/03/8118 8.3* 8.4 - 10.5 mg/dL Final  . GFR calc non Af Amer 07/20/2013 87* >90 mL/min Final  . GFR calc Af Amer 07/20/2013 >90  >90 mL/min Final   Comment: (NOTE)                          The eGFR has been calculated using the CKD EPI equation.                          This calculation has not been validated in all clinical situations.                          eGFR's persistently <90 mL/min signify possible Chronic Kidney                          Disease.  . Sed Rate 07/20/2013 66* 0 - 16 mm/hr Final  . CRP 07/20/2013 12.8* <0.60 mg/dL Final   Performed at [facility]      X-Rays:Dg Chest Port 1 View  07/19/2013   CLINICAL DATA:  Confirm line placement  EXAM: PORTABLE CHEST - 1 VIEW  COMPARISON:  12/29/2012  FINDINGS: Interval placement of a right PICC catheter with tip over the cavoatrial junction. No pneumothorax. Normal heart size and pulmonary vascularity. Probable emphysematous changes in the lungs. Calcified granulomas in the right lung with calcified right hilar lymph nodes. No focal airspace disease in the lungs. No blunting of costophrenic angles. Tortuous aorta.  IMPRESSION: Right PICC line tip over the cavoatrial junction.   Electronically Signed   By: Burman Nieves M.D.   On: 07/19/2013 21:14    EKG: Orders placed during the hospital encounter of 01/03/13  . EKG     Hospital Course: MARLEN MOLLICA is a 79 y.o. who was admitted to [facility]. They were brought to the operating room on 07/18/2013 and underwent Procedure(s): RIGHT KNEE RESECTION ARTHROPLASTY WITH ANTIBIOTIC SPACERS .  Patient tolerated the procedure well and was later transferred to the recovery room and then to the orthopaedic floor for postoperative care.  They were given PO and IV analgesics for pain control following their surgery.  They were given 24 hours of postoperative antibiotics of  Anti-infectives   Start     Dose/Rate Route Frequency Ordered Stop   07/20/13 0000  ceFAZolin (ANCEF) 2-3 GM-% SOLR     2 g 100 mL/hr over 30 Minutes Intravenous Every 8 hours 07/20/13 1428 08/29/13 2359   07/19/13 2200  ceFAZolin (ANCEF) IVPB 2 g/50 mL premix     2 g 100 mL/hr over 30 Minutes Intravenous 3 times per day 07/19/13 1502     07/18/13 2230  ceFAZolin (ANCEF) IVPB 2 g/50 mL premix  Status:  Discontinued     2 g 100 mL/hr  over 30 Minutes Intravenous 4 times per day 07/18/13 2023 07/19/13 1502   07/18/13 1230  ceFAZolin (ANCEF) IVPB 2 g/50 mL premix     2 g 100 mL/hr over 30 Minutes Intravenous On call to O.R. 07/18/13 1211 07/18/13 1713     and started on DVT  prophylaxis in the form of Aspirin.   PT and OT were ordered for total joint protocol.  Discharge planning consulted to help with postop disposition and equipment needs.  Patient had a decent night on the evening of surgery.  They started to get up OOB with therapy on day one. Hemovac drains were left in place on the first day.  Continued to work with therapy into day two.  Dressing was changed on day two and the incision was healing well with staples intact. Patient was seen in rounds on day two and was doing well.  It was felt that as long as the patient continued to improve, they would be ready to transfer the following day, Thursday, New Years Day, 07/21/2013.  The patient will evaluated by the holiday coverage staff and will discharge if doing well.  This summary was prepared in anticipation of the the patient's transfer over the holiday.   Discharge Medications: Prior to Admission medications   Medication Sig Start Date End Date Taking? Authorizing Provider  ALPRAZolam Prudy Feeler) 0.5 MG tablet Take 0.5 mg by mouth daily as needed for anxiety.   Yes Historical Provider, MD  atenolol (TENORMIN) 50 MG tablet Take 25 mg by mouth every morning. Takes 1/2 tablet   Yes Historical Provider, MD  hydrochlorothiazide (HYDRODIURIL) 25 MG tablet Take 25 mg by mouth every morning.   Yes Historical Provider, MD  lansoprazole (PREVACID) 30 MG capsule Take 30 mg by mouth daily.    Yes Historical Provider, MD  latanoprost (XALATAN) 0.005 % ophthalmic solution Place 1 drop into both eyes at bedtime.   Yes Historical Provider, MD  valsartan (DIOVAN) 320 MG tablet Take 320 mg by mouth every morning.   Yes Historical Provider, MD  acetaminophen (TYLENOL) 325 MG tablet Take 2 tablets (650 mg total) by mouth every 6 (six) hours as needed for mild pain (or Fever >/= 101). 07/20/13   Joury Allcorn Julien Girt, PA-C  aspirin 325 MG EC tablet Take 325 mg by mouth every 6 (six) hours as needed (headache). 05/19/13   Jayden Rudge Julien Girt,  PA-C  aspirin EC 325 MG EC tablet Take 1 tablet (325 mg total) by mouth daily. 07/20/13   Carneshia Raker Julien Girt, PA-C  bisacodyl (DULCOLAX) 10 MG suppository Place 1 suppository (10 mg total) rectally daily as needed for moderate constipation. 07/20/13   Faithe Ariola, PA-C  ceFAZolin (ANCEF) 2-3 GM-% SOLR Inject 50 mLs (2 g total) into the vein every 8 (eight) hours. Infuse 2 grams via PICC line three times a day for a total of six weeks through February 9th. 07/20/13 08/29/13  Dayane Hillenburg Julien Girt, PA-C  6 weeks of IV Treatment via PICC LINE  docusate sodium 100 MG CAPS Take 100 mg by mouth 2 (two) times daily. 07/20/13   Lian Pounds Julien Girt, PA-C  methocarbamol (ROBAXIN) 500 MG tablet Take 1 tablet (500 mg total) by mouth every 6 (six) hours as needed for muscle spasms. 07/20/13   Analyssa Downs Julien Girt, PA-C  metoCLOPramide (REGLAN) 5 MG tablet Take 1-2 tablets (5-10 mg total) by mouth every 8 (eight) hours as needed for nausea (if ondansetron (ZOFRAN) ineffective.). 07/20/13   Allyah Heather, PA-C  ondansetron (ZOFRAN) 4 MG tablet Take 1 tablet (  4 mg total) by mouth every 6 (six) hours as needed for nausea. 07/20/13   Aliviyah Malanga, PA-C  oxyCODONE (OXY IR/ROXICODONE) 5 MG immediate release tablet Take 1-2 tablets (5-10 mg total) by mouth every 3 (three) hours as needed for moderate pain or severe pain. 07/20/13   Samuel Mcpeek, PA-C  polyethylene glycol (MIRALAX / GLYCOLAX) packet Take 17 g by mouth daily as needed for mild constipation. 07/20/13   Donny Heffern, PA-C  traMADol (ULTRAM) 50 MG tablet Take 1-2 tablets (50-100 mg total) by mouth every 6 (six) hours as needed (mild pain). 07/20/13   Amjad Fikes Julien Girt, PA-C    Diet: Cardiac diet Activity:WBAT Follow-up:in 2 weeks Disposition - Skilled nursing facility - [facility] Discharged Condition: Pending at time of summary, Transfer on Thursday if doing well on holiday rounds.   Discharge Orders   Future  Appointments Provider Department Dept Phone   08/04/2013 2:00 PM Ginnie Smart, MD [facility] [phone]   Future Orders Complete By Expires   Call MD / Call 911  As directed    Comments:     If you experience chest pain or shortness of breath, CALL 911 and be transported to the hospital emergency room.  If you develope a fever above 101 F, pus (white drainage) or increased drainage or redness at the wound, or calf pain, call your surgeon's office.   Change dressing  As directed    Comments:     Change dressing daily with sterile 4 x 4 inch gauze dressing and apply TED hose. Do not submerge the incision under water.   Constipation Prevention  As directed    Comments:     Drink plenty of fluids.  Prune juice may be helpful.  You may use a stool softener, such as Colace (over the counter) 100 mg twice a day.  Use MiraLax (over the counter) for constipation as needed.   Diet - low sodium heart healthy  As directed    Discharge instructions  As directed    Comments:     Pick up stool softner and laxative for home. Do not submerge incision under water. May shower. Continue to use ice for pain and swelling from surgery.  Take a 325 mg Aspirin daily for six more weeks. No bending or motion to the right knee. Knee Immobilizer at all times. May be full weight bearing to right leg but not motion or bending.   Do not sit on low chairs, stoools or toilet seats, as it may be difficult to get up from low surfaces  As directed    Driving restrictions  As directed    Comments:     No driving until released by the physician.   Increase activity slowly as tolerated  As directed    Lifting restrictions  As directed    Comments:     No lifting until released by the physician.   Patient may shower  As directed    Comments:     You may shower without a dressing once there is no drainage.  Do not wash over the wound.  If drainage remains, do not shower until  drainage stops.   TED hose  As directed    Comments:     Use stockings (TED hose) for 3 weeks on both leg(s).  You may remove them at night for sleeping.   Weight bearing as tolerated  As directed    Questions:     Laterality:  Extremity:         Medication List         acetaminophen 325 MG tablet  Commonly known as:  TYLENOL  Take 2 tablets (650 mg total) by mouth every 6 (six) hours as needed for mild pain (or Fever >/= 101).     ALPRAZolam 0.5 MG tablet  Commonly known as:  XANAX  Take 0.5 mg by mouth daily as needed for anxiety.     aspirin 325 MG EC tablet  Take 1 tablet (325 mg total) by mouth daily.     atenolol 50 MG tablet  Commonly known as:  TENORMIN  Take 25 mg by mouth every morning. Takes 1/2 tablet     bisacodyl 10 MG suppository  Commonly known as:  DULCOLAX  Place 1 suppository (10 mg total) rectally daily as needed for moderate constipation.     ceFAZolin 2-3 GM-% Solr  Commonly known as:  ANCEF  Inject 50 mLs (2 g total) into the vein every 8 (eight) hours. Infuse 2 grams via PICC line three times a day for a total of six weeks through February 9th. 6 weeks of IV Treatment via PICC LINE     DSS 100 MG Caps  Take 100 mg by mouth 2 (two) times daily.     hydrochlorothiazide 25 MG tablet  Commonly known as:  HYDRODIURIL  Take 25 mg by mouth every morning.     lansoprazole 30 MG capsule  Commonly known as:  PREVACID  Take 30 mg by mouth daily.     latanoprost 0.005 % ophthalmic solution  Commonly known as:  XALATAN  Place 1 drop into both eyes at bedtime.     methocarbamol 500 MG tablet  Commonly known as:  ROBAXIN  Take 1 tablet (500 mg total) by mouth every 6 (six) hours as needed for muscle spasms.     metoCLOPramide 5 MG tablet  Commonly known as:  REGLAN  Take 1-2 tablets (5-10 mg total) by mouth every 8 (eight) hours as needed for nausea (if ondansetron (ZOFRAN) ineffective.).     ondansetron 4 MG tablet  Commonly known as:  ZOFRAN   Take 1 tablet (4 mg total) by mouth every 6 (six) hours as needed for nausea.     oxyCODONE 5 MG immediate release tablet  Commonly known as:  Oxy IR/ROXICODONE  Take 1-2 tablets (5-10 mg total) by mouth every 3 (three) hours as needed for moderate pain or severe pain.     polyethylene glycol packet  Commonly known as:  MIRALAX / GLYCOLAX  Take 17 g by mouth daily as needed for mild constipation.     traMADol 50 MG tablet  Commonly known as:  ULTRAM  Take 1-2 tablets (50-100 mg total) by mouth every 6 (six) hours as needed (mild pain).     valsartan 320 MG tablet  Commonly known as:  DIOVAN  Take 320 mg by mouth every morning.           Follow-up Information   Follow up with Loanne Drilling, MD. Schedule an appointment as soon as possible for a visit in 2 weeks.   Specialty:  Orthopedic Surgery   Contact information:   [address] [place] [address] [phone]       Signed: Patrica Duel 07/20/2013, 2:29 PM

## 2013-07-21 DIAGNOSIS — D62 Acute posthemorrhagic anemia: Secondary | ICD-10-CM | POA: Diagnosis not present

## 2013-07-21 DIAGNOSIS — H409 Unspecified glaucoma: Secondary | ICD-10-CM | POA: Diagnosis not present

## 2013-07-21 DIAGNOSIS — I959 Hypotension, unspecified: Secondary | ICD-10-CM | POA: Diagnosis not present

## 2013-07-21 DIAGNOSIS — I10 Essential (primary) hypertension: Secondary | ICD-10-CM | POA: Diagnosis not present

## 2013-07-21 DIAGNOSIS — K219 Gastro-esophageal reflux disease without esophagitis: Secondary | ICD-10-CM | POA: Diagnosis not present

## 2013-07-21 DIAGNOSIS — Z96659 Presence of unspecified artificial knee joint: Secondary | ICD-10-CM | POA: Diagnosis not present

## 2013-07-21 DIAGNOSIS — Z452 Encounter for adjustment and management of vascular access device: Secondary | ICD-10-CM | POA: Diagnosis not present

## 2013-07-21 DIAGNOSIS — M009 Pyogenic arthritis, unspecified: Secondary | ICD-10-CM | POA: Diagnosis not present

## 2013-07-21 DIAGNOSIS — F411 Generalized anxiety disorder: Secondary | ICD-10-CM | POA: Diagnosis not present

## 2013-07-21 DIAGNOSIS — S99929A Unspecified injury of unspecified foot, initial encounter: Secondary | ICD-10-CM | POA: Diagnosis not present

## 2013-07-21 DIAGNOSIS — M6281 Muscle weakness (generalized): Secondary | ICD-10-CM | POA: Diagnosis not present

## 2013-07-21 DIAGNOSIS — D649 Anemia, unspecified: Secondary | ICD-10-CM | POA: Diagnosis not present

## 2013-07-21 DIAGNOSIS — T8450XA Infection and inflammatory reaction due to unspecified internal joint prosthesis, initial encounter: Secondary | ICD-10-CM | POA: Diagnosis not present

## 2013-07-21 DIAGNOSIS — M25569 Pain in unspecified knee: Secondary | ICD-10-CM | POA: Diagnosis not present

## 2013-07-21 DIAGNOSIS — R262 Difficulty in walking, not elsewhere classified: Secondary | ICD-10-CM | POA: Diagnosis not present

## 2013-07-21 DIAGNOSIS — A4901 Methicillin susceptible Staphylococcus aureus infection, unspecified site: Secondary | ICD-10-CM | POA: Diagnosis not present

## 2013-07-21 DIAGNOSIS — S8990XA Unspecified injury of unspecified lower leg, initial encounter: Secondary | ICD-10-CM | POA: Diagnosis not present

## 2013-07-21 DIAGNOSIS — H9190 Unspecified hearing loss, unspecified ear: Secondary | ICD-10-CM | POA: Diagnosis not present

## 2013-07-21 DIAGNOSIS — Z89529 Acquired absence of unspecified knee: Secondary | ICD-10-CM | POA: Diagnosis not present

## 2013-07-21 DIAGNOSIS — R279 Unspecified lack of coordination: Secondary | ICD-10-CM | POA: Diagnosis not present

## 2013-07-21 DIAGNOSIS — R269 Unspecified abnormalities of gait and mobility: Secondary | ICD-10-CM | POA: Diagnosis not present

## 2013-07-21 LAB — CBC
HCT: 23.8 % — ABNORMAL LOW (ref 39.0–52.0)
Hemoglobin: 8.1 g/dL — ABNORMAL LOW (ref 13.0–17.0)
MCH: 28.8 pg (ref 26.0–34.0)
MCHC: 34 g/dL (ref 30.0–36.0)
MCV: 84.7 fL (ref 78.0–100.0)
Platelets: 369 10*3/uL (ref 150–400)
RBC: 2.81 MIL/uL — ABNORMAL LOW (ref 4.22–5.81)
RDW: 14.5 % (ref 11.5–15.5)
WBC: 8.9 10*3/uL (ref 4.0–10.5)

## 2013-07-21 LAB — BODY FLUID CULTURE

## 2013-07-21 MED ORDER — HEPARIN SOD (PORK) LOCK FLUSH 100 UNIT/ML IV SOLN
250.0000 [IU] | INTRAVENOUS | Status: AC | PRN
  Administered 2013-07-21: 250 [IU]

## 2013-07-21 NOTE — Progress Notes (Signed)
Subjective: 3 Days Post-Op Procedure(s) (LRB): RIGHT KNEE RESECTION ARTHROPLASTY WITH ANTIBIOTIC SPACERS  (Right) Patient reports pain as 2 on 0-10 scale.    Objective: Vital signs in last 24 hours: Temp:  [97.3 F (36.3 C)-97.8 F (36.6 C)] 97.3 F (36.3 C) (01/01 0530) Pulse Rate:  [56-72] 72 (01/01 0917) Resp:  [16-18] 16 (01/01 0530) BP: (101-118)/(63-75) 113/63 mmHg (01/01 0917) SpO2:  [96 %-99 %] 99 % (01/01 0530)  Intake/Output from previous day: 12/31 0701 - 01/01 0700 In: 1069 [P.O.:720; I.V.:349] Out: 4095 [Urine:4095] Intake/Output this shift:     Recent Labs  07/18/13 1235 07/19/13 0518 07/20/13 0500 07/21/13 0645  HGB 11.3* 8.9* 8.8* 8.1*    Recent Labs  07/20/13 0500 07/21/13 0645  WBC 12.8* 8.9  RBC 3.09* 2.81*  HCT 26.3* 23.8*  PLT 415* 369    Recent Labs  07/19/13 0518 07/20/13 0500  NA 135* 136*  K 4.2 4.2  CL 104 104  CO2 22 23  BUN 13 10  CREATININE 0.79 0.68  GLUCOSE 112* 161*  CALCIUM 8.2* 8.3*    Recent Labs  07/18/13 1235  INR 1.30    Neurologically intact  Assessment/Plan: 3 Days Post-Op Procedure(s) (LRB): RIGHT KNEE RESECTION ARTHROPLASTY WITH ANTIBIOTIC SPACERS  (Right) Discharge to SNF  Ramon Brant A 07/21/2013, 9:53 AM

## 2013-07-21 NOTE — Progress Notes (Signed)
Physical Therapy Treatment Patient Details Name: Roger Cameron MRN: [id number] DOB: 1929/02/05 Today's Date: 07/21/2013 Time: 6378-5885 PT Time Calculation (min): 26 min  PT Assessment / Plan / Recommendation  History of Present Illness s/p R knee resection arthoplasty and antibiotic spacer   PT Comments   Assisted pt off BSC than amb limited distance in hallway.  Returned to room and then performed B LE TE's of AP and knee presses.  Instructed pt on KI use "all times" and no knee ROM.  Positioned in recliner then applied ICE.   Follow Up Recommendations  SNF     Does the patient have the potential to tolerate intense rehabilitation     Barriers to Discharge        Equipment Recommendations       Recommendations for Other Services    Frequency 7X/week   Progress towards PT Goals Progress towards PT goals: Progressing toward goals  Plan      Precautions / Restrictions Precautions Precautions: Fall;Knee Required Braces or Orthoses: Knee Immobilizer - Right Knee Immobilizer - Right: On at all times Restrictions Weight Bearing Restrictions: No Other Position/Activity Restrictions: WBAT, NO ROM to R knee    Pertinent Vitals/Pain C/o "quite much" R knee pain Pre medicated ICE applied    Mobility  Bed Mobility Bed Mobility: Not assessed Details for Bed Mobility Assistance: Pt OOB in BSC on arrival Transfers Transfers: Sit to Stand;Stand to Sit Sit to Stand: 3: Mod assist;With upper extremity assist;From toilet;From chair/3-in-1 Stand to Sit: 3: Mod assist;To chair/3-in-1;With upper extremity assist Details for Transfer Assistance: verbal cues for safe technique, assist to rise and control descent, +2 for safety Ambulation/Gait Ambulation/Gait Assistance: 4: Min assist Ambulation Distance (Feet): 80 Feet Assistive device: Rolling walker Ambulation/Gait Assistance Details: 25% VC's on proper tech and upright posture Gait Pattern: Step-to pattern;Antalgic Gait  velocity: decr    Exercises  B AP 10 reps B knee presses 10 reps    PT Goals (current goals can now be found in the care plan section)    Visit Information  Last PT Received On: 07/21/13 Assistance Needed: +2 History of Present Illness: s/p R knee resection arthoplasty and antibiotic spacer    Subjective Data      Cognition       Balance     End of Session PT - End of Session Equipment Utilized During Treatment: Right knee immobilizer;Gait belt Activity Tolerance: Patient limited by pain Patient left: in chair;with call bell/phone within reach;with family/visitor present   Rica Koyanagi  PTA [facility]  Acute  Rehab Pager      [phone]

## 2013-07-21 NOTE — Progress Notes (Signed)
Clinical Social Work Department CLINICAL SOCIAL WORK PLACEMENT NOTE 07/21/2013  Patient:  NASHTON, BELSON  Account Number:  [id number] Admit date:  07/18/2013  Clinical Social Worker:  Werner Lean, LCSW  Date/time:  07/19/2013 02:15 PM  Clinical Social Work is seeking post-discharge placement for this patient at the following level of care:   SKILLED NURSING   (*CSW will update this form in Epic as items are completed)   07/19/2013  Patient/family provided with [place] Department of Clinical Social Work's list of facilities offering this level of care within the geographic area requested by the patient (or if unable, by the patient's family).  07/19/2013  Patient/family informed of their freedom to choose among providers that offer the needed level of care, that participate in Medicare, Medicaid or managed care program needed by the patient, have an available bed and are willing to accept the patient.    Patient/family informed of [facility]' ownership interest in [facility], as well as of the fact that they are under no obligation to receive care at this facility.  PASARR submitted to EDS on 07/19/2013 PASARR number received from EDS on 07/19/2013  FL2 transmitted to all facilities in geographic area requested by pt/family on  07/19/2013 FL2 transmitted to all facilities within larger geographic area on   Patient informed that his/her managed care company has contracts with or will negotiate with  certain facilities, including the following:     Patient/family informed of bed offers received:  07/19/2013 Patient chooses bed at [facility] Physician recommends and patient chooses bed at    Patient to be transferred to [facility] on  07/21/2013 Patient to be transferred to facility by EMS  The following physician request were entered in Epic:   Additional Comments:  Werner Lean LCSW [phone]

## 2013-07-22 ENCOUNTER — Non-Acute Institutional Stay (SKILLED_NURSING_FACILITY): Payer: Medicare Other | Admitting: Internal Medicine

## 2013-07-22 ENCOUNTER — Other Ambulatory Visit: Payer: Self-pay | Admitting: *Deleted

## 2013-07-22 DIAGNOSIS — M009 Pyogenic arthritis, unspecified: Secondary | ICD-10-CM | POA: Diagnosis not present

## 2013-07-22 DIAGNOSIS — I10 Essential (primary) hypertension: Secondary | ICD-10-CM | POA: Diagnosis not present

## 2013-07-22 DIAGNOSIS — K219 Gastro-esophageal reflux disease without esophagitis: Secondary | ICD-10-CM | POA: Diagnosis not present

## 2013-07-22 DIAGNOSIS — D62 Acute posthemorrhagic anemia: Secondary | ICD-10-CM | POA: Insufficient documentation

## 2013-07-22 MED ORDER — OXYCODONE HCL 5 MG PO TABS: 5.0000 mg | ORAL_TABLET | ORAL | Status: DC | PRN

## 2013-07-22 MED ORDER — TRAMADOL HCL 50 MG PO TABS: 50.0000 mg | ORAL_TABLET | Freq: Four times a day (QID) | ORAL | Status: DC | PRN

## 2013-07-22 MED ORDER — OXYCODONE HCL 5 MG PO TABS: 5.0000 mg | ORAL_TABLET | ORAL | Status: AC | PRN

## 2013-07-22 NOTE — Progress Notes (Signed)
HISTORY & PHYSICAL  DATE: 07/22/2013   FACILITY: [facility]  LEVEL OF CARE: SNF (31)  ALLERGIES:  Allergies  Allergen Reactions  . Adhesive [Tape] Rash    blisters    CHIEF COMPLAINT:  Manage right knee prosthetic infection, hypertension and GERD  HISTORY OF PRESENT ILLNESS: Patient is an 80 year old Caucasian male.  RIGHT KNEE INFECTION: Patient was having and in fact had right knee prostheses. He was hospitalized and underwent resection arthroplasty and antibiotic spacer placement. He tolerated the procedures well. He is admitted to this facility for short-term rehabilitation. He denies knee pain.  HTN: Pt 's HTN remains stable.  Denies CP, sob, DOE, pedal edema, headaches, dizziness or visual disturbances.  No complications from the medications currently being used.  Last BP : 87/45, 104/57  GERD: pt's GERD is stable.  Denies ongoing heartburn, abd. Pain, nausea or vomiting.  Currently on a PPI & tolerates it without any adverse reactions.  PAST MEDICAL HISTORY :  Past Medical History  Diagnosis Date  . Impaired hearing     NO HEARING AIDS  . Anxiety   . Glaucoma     BILATERAL  . Cataract     LEFT EYE  . Hypertension   . GERD (gastroesophageal reflux disease)   . Arthritis   . Infection 07/15/13    RIGHT KNEE INFECTION  S/P RT UNIKNEE REPLACEMENT ON 01/03/13 - STATES HIS KNEE IS VERY SWOLLEN     PAST SURGICAL HISTORY: Past Surgical History  Procedure Laterality Date  . Right knee arthroscopy  x 2    . Hernia repair      RIGHT INGUINAL HERNAI REPAIR  . Tonsillectomy      AS A CHILD  . Eye surgery      RIGHT EYE CATARACT REMOVED  . Partial knee arthroplasty Right 01/03/2013    Procedure: RIGHT KNEE MEDIAL COMPARTMENT UNI COMPARMENTAL ARTHROPLASTY;  Surgeon: [name], MD;  Location: [facility];  Service: Orthopedics;  Laterality: Right;  . Irrigation and debridement knee Right 02/02/2013    Procedure: IRRIGATION AND DEBRIDEMENT KNEE;   Surgeon: [name], MD;  Location: [facility];  Service: Orthopedics;  Laterality: Right;  . Irrigation and debridement knee Right 05/17/2013    Procedure: IRRIGATION AND DEBRIDEMENT RIGHT KNEE;  Surgeon: [name], MD;  Location: [facility];  Service: Orthopedics;  Laterality: Right;  STIMULIN BEADS  . Excisional total knee arthroplasty with antibiotic spacers Right 07/18/2013    Procedure: RIGHT KNEE RESECTION ARTHROPLASTY WITH ANTIBIOTIC SPACERS ;  Surgeon: [name], MD;  Location: [facility];  Service: Orthopedics;  Laterality: Right;    SOCIAL HISTORY:  reports that he has never smoked. He has never used smokeless tobacco. He reports that he drinks alcohol. He reports that he does not use illicit drugs.  FAMILY HISTORY: None  CURRENT MEDICATIONS: Reviewed per MAR  REVIEW OF SYSTEMS:  See HPI otherwise 14 point ROS is negative.  PHYSICAL EXAMINATION  VS:  T 97.9      P 54      RR 18       BP 87/45      POX% 99          GENERAL: no acute distress, normal body habitus EYES: conjunctivae normal, sclerae normal, normal eye lids MOUTH/THROAT: lips without lesions,no lesions in the mouth,tongue is without lesions,uvula elevates in midline NECK: supple, trachea midline, no neck masses, no thyroid tenderness, no thyromegaly LYMPHATICS: no LAN in the neck,  no supraclavicular LAN RESPIRATORY: breathing is even & unlabored, BS CTAB CARDIAC: RRR, no murmur,no extra heart sounds, right lower extremity +2 edema GI:  ABDOMEN: abdomen soft, normal BS, no masses, no tenderness  LIVER/SPLEEN: no hepatomegaly, no splenomegaly MUSCULOSKELETAL: HEAD: normal to inspection & palpation BACK: no kyphosis, scoliosis or spinal processes tenderness EXTREMITIES: LEFT UPPER EXTREMITY: full range of motion, normal strength & tone RIGHT UPPER EXTREMITY:  full range of motion, normal strength & tone LEFT LOWER EXTREMITY:  Moderate range of motion, normal strength & tone RIGHT LOWER EXTREMITY:  range  of motion not tested due to surgery, normal strength & tone PSYCHIATRIC: the patient is alert & oriented to person, affect & behavior appropriate  LABS/RADIOLOGY:  Labs reviewed: Basic Metabolic Panel:  Recent Labs  07/18/13 1235 07/19/13 0518 07/20/13 0500  NA 135 135* 136*  K 4.1 4.2 4.2  CL 102 104 104  CO2 19 22 23   GLUCOSE 82 112* 161*  BUN 12 13 10   CREATININE 0.81 0.79 0.68  CALCIUM 9.6 8.2* 8.3*   Liver Function Tests:  Recent Labs  12/29/12 1140 05/17/13 1225 07/18/13 1235  AST 21 25 16   ALT 12 29 16   ALKPHOS 64 145* 95  BILITOT 0.8 0.6 0.5  PROT 7.8 8.0 7.6  ALBUMIN 4.2 3.5 3.1*    CBC:  Recent Labs  07/19/13 0518 07/20/13 0500 07/21/13 0645  WBC 9.6 12.8* 8.9  HGB 8.9* 8.8* 8.1*  HCT 26.1* 26.3* 23.8*  MCV 84.7 85.1 84.7  PLT 384 415* 369   EXAM: PORTABLE CHEST - 1 VIEW   COMPARISON:  12/29/2012   FINDINGS: Interval placement of a right PICC catheter with tip over the cavoatrial junction. No pneumothorax. Normal heart size and pulmonary vascularity. Probable emphysematous changes in the lungs. Calcified granulomas in the right lung with calcified right hilar lymph nodes. No focal airspace disease in the lungs. No blunting of costophrenic angles. Tortuous aorta.   IMPRESSION: Right PICC line tip over the cavoatrial junction.    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 Continue IV antibiotics Hypertension-blood pressure has dropped. reassess. Hold blood pressure medications for systolic blood pressure less than 110. GERD-stable Acute blood loss anemia-recheck Constipation-well-controlled   I have reviewed patient's medical records received at admission/from hospitalization.  CPT CODE: 57017

## 2013-07-22 NOTE — Telephone Encounter (Signed)
rx filled per protocol  

## 2013-07-23 LAB — ANAEROBIC CULTURE

## 2013-07-28 ENCOUNTER — Non-Acute Institutional Stay (SKILLED_NURSING_FACILITY): Payer: Medicare Other | Admitting: Adult Health

## 2013-07-28 DIAGNOSIS — I959 Hypotension, unspecified: Secondary | ICD-10-CM

## 2013-07-28 DIAGNOSIS — D62 Acute posthemorrhagic anemia: Secondary | ICD-10-CM

## 2013-08-04 ENCOUNTER — Encounter: Payer: Self-pay | Admitting: Infectious Diseases

## 2013-08-04 ENCOUNTER — Ambulatory Visit (INDEPENDENT_AMBULATORY_CARE_PROVIDER_SITE_OTHER): Payer: Medicare Other | Admitting: Infectious Diseases

## 2013-08-04 VITALS — BP 109/52 | HR 64 | Temp 97.7°F

## 2013-08-04 DIAGNOSIS — M009 Pyogenic arthritis, unspecified: Secondary | ICD-10-CM | POA: Diagnosis not present

## 2013-08-04 NOTE — Progress Notes (Signed)
   Subjective:    Patient ID: Roger Cameron, male    DOB: 01/16/1929, 80 y.o.   MRN: [id number]  HPI 80 y.o. male with right knee arthroscopy, replacement in June 2014, post-op hematoma, irrigation and debridement in July 2014, swelling again in October 2014 with I and D (no infection). He returned to [facility] on 12-29  after aspiration in office noted purulence with CoNS growth. He underwent resection arthroplasty 07/18/13 with antibiotic spacer placement. His Cx grew MSSA. His ESR was 66 and his CRP was 12.8. He was d/c home on 07-21-13 on IV ancef with plan to complete therapy on 08-29-13.  Has been able to walk with a walker, bears no weight on leg. Swelling is better than prior to surgery. Heat also is improved. No problems with anbx. No problems with PIC line (no erythema, pain or d/c.).  Is being d/c from [facility] on 08-09-13.  He is planned for repeat TKR 2 weeks after 08-29-13.    Review of Systems  Constitutional: Negative for fever and chills.  Gastrointestinal: Negative for nausea, vomiting, diarrhea and constipation.  Genitourinary: Negative for difficulty urinating.       Objective:   Physical Exam  Constitutional: He appears well-developed and well-nourished.  Eyes: EOM are normal.  Neck: Neck supple.  Cardiovascular: Normal rate, regular rhythm and normal heart sounds.   Pulmonary/Chest: Effort normal and breath sounds normal.  Abdominal: Soft. Bowel sounds are normal. He exhibits no distension. There is no tenderness.  Musculoskeletal:       Arms:      Legs: Lymphadenopathy:    He has no cervical adenopathy.          Assessment & Plan:   

## 2013-08-04 NOTE — Assessment & Plan Note (Signed)
Will discuss with pt's home health and try to get him simpler IV infusion since he lives alone. Will obtain his labs from [facility]. Will recheck his ESR, CRP. Will plan to stop his IV anbx on 08-29-13 and have his PIC removed. Will see him back ~ 1 week after this.

## 2013-08-08 ENCOUNTER — Non-Acute Institutional Stay (SKILLED_NURSING_FACILITY): Payer: Medicare Other | Admitting: Adult Health

## 2013-08-08 DIAGNOSIS — H409 Unspecified glaucoma: Secondary | ICD-10-CM | POA: Diagnosis not present

## 2013-08-08 DIAGNOSIS — K219 Gastro-esophageal reflux disease without esophagitis: Secondary | ICD-10-CM | POA: Diagnosis not present

## 2013-08-08 DIAGNOSIS — D62 Acute posthemorrhagic anemia: Secondary | ICD-10-CM

## 2013-08-08 DIAGNOSIS — M009 Pyogenic arthritis, unspecified: Secondary | ICD-10-CM

## 2013-08-08 DIAGNOSIS — I959 Hypotension, unspecified: Secondary | ICD-10-CM | POA: Insufficient documentation

## 2013-08-08 DIAGNOSIS — I10 Essential (primary) hypertension: Secondary | ICD-10-CM | POA: Diagnosis not present

## 2013-08-08 NOTE — Progress Notes (Signed)
Patient ID: Roger Cameron, male   DOB: 12-Oct-1928, 80 y.o.   MRN: [id number]          PROGRESS NOTE  DATE: 08/08/13  FACILITY:  [facility]  LEVEL OF CARE: SNF (31)  Acute Visit  CHIEF COMPLAINT:  Discharge Notes  HISTORY OF PRESENT ILLNESS:  This is an 80 year old male who is for discharge Home health PT, OT and Nursing. He has been admitted to [facility] on 07/21/13 from [facility] with Septic Arthritis S/P Right knee resection arthroplasty with antibiotic spacers. Patient was admitted to this facility for short-term rehabilitation after the patient's recent hospitalization.  Patient has completed SNF rehabilitation and therapy has cleared the patient for discharge.  Reassessment of ongoing problem(s):  HTN: Pt 's HTN remains stable.  Denies CP, sob, DOE, pedal edema, headaches, dizziness or visual disturbances.  No complications from the medications currently being used.  Last BP : 135/78  GERD: pt's GERD is stable.  Denies ongoing heartburn, abd. Pain, nausea or vomiting.  Currently on a PPI & tolerates it without any adverse reactions.   PAST MEDICAL HISTORY : Reviewed.  No changes.  CURRENT MEDICATIONS: Reviewed per [employer]  REVIEW OF SYSTEMS:  GENERAL: no change in appetite, no fatigue, no weight changes, no fever, chills or weakness RESPIRATORY: no cough, SOB, DOE,, wheezing, hemoptysis CARDIAC: no chest pain, edema or palpitations GI: no abdominal pain, diarrhea, constipation, heart burn, nausea or vomiting  PHYSICAL EXAMINATION  VS:  T98.6      P70      RR17      BP135/78            WT176.4 (Lb)  GENERAL: no acute distress, normal body habitus NECK: supple, trachea midline, no neck masses, no thyroid tenderness, no thyromegaly LYMPHATICS: no LAN in the neck, no supraclavicular LAN RESPIRATORY: breathing is even & unlabored, BS CTAB CARDIAC: RRR, no murmur,no extra heart sounds, no edema GI: abdomen soft, normal BS, no masses, no  tenderness, no hepatomegaly, no splenomegaly EXTREMITIES: +Right upper arm single lumen PICC, right knee surgical incision is dry and no redness PSYCHIATRIC: the patient is alert & oriented to person, affect & behavior appropriate  LABS/RADIOLOGY: Labs reviewed: 07/26/13 WBC 9.7 hemoglobin 8.9 hematocrit 28.8 07/25/13 WBC 11.0 hemoglobin 8.6 hematocrit 93.2  Basic Metabolic Panel:  Recent Labs  07/18/13 1235 07/19/13 0518 07/20/13 0500  NA 135 135* 136*  K 4.1 4.2 4.2  CL 102 104 104  CO2 19 22 23   GLUCOSE 82 112* 161*  BUN 12 13 10   CREATININE 0.81 0.79 0.68  CALCIUM 9.6 8.2* 8.3*   Liver Function Tests:  Recent Labs  12/29/12 1140 05/17/13 1225 07/18/13 1235  AST 21 25 16   ALT 12 29 16   ALKPHOS 64 145* 95  BILITOT 0.8 0.6 0.5  PROT 7.8 8.0 7.6  ALBUMIN 4.2 3.5 3.1*   CBC:  Recent Labs  07/19/13 0518 07/20/13 0500 07/21/13 0645  WBC 9.6 12.8* 8.9  HGB 8.9* 8.8* 8.1*  HCT 26.1* 26.3* 23.8*  MCV 84.7 85.1 84.7  PLT 384 415* 369     ASSESSMENT/PLAN:  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  Patient will receive home health PT, OT  and Nursing.   Total discharge time: Less than 30 minutes Discharge time involved coordination of the discharge process with Education officer, museum, nursing staff and therapy department. Medical justification for home health services verified.  CPT CODE: 77824

## 2013-08-08 NOTE — Progress Notes (Signed)
Patient ID: Roger Cameron, male   DOB: 1929/03/17, 80 y.o.   MRN: [id number]         PROGRESS NOTE  DATE: 07/28/2013  FACILITY:  [facility]  LEVEL OF CARE: SNF (31)  Acute Visit  CHIEF COMPLAINT:  Manage Hypotension  HISTORY OF PRESENT ILLNESS:  This is an 80 year old male who was noted to have low BPs 95/55, 100/53, 88/50. No complaints of dizziness nor shortness of breath. He had a recent right knee resection and latest hgb 8.9.    PAST MEDICAL HISTORY : Reviewed.  No changes.  CURRENT MEDICATIONS: Reviewed per [facility]  REVIEW OF SYSTEMS:  GENERAL: no change in appetite, no fatigue, no weight changes, no fever, chills or weakness RESPIRATORY: no cough, SOB, DOE,, wheezing, hemoptysis CARDIAC: no chest pain, edema or palpitations GI: no abdominal pain, diarrhea, constipation, heart burn, nausea or vomiting  PHYSICAL EXAMINATION  VS:  T97.1        P60       RR18       BP95/55      POX98 %       WT176.8 (Lb)  GENERAL: no acute distress, normal body habitus EYES: conjunctivae normal, sclerae normal, normal eye lids NECK: supple, trachea midline, no neck masses, no thyroid tenderness, no thyromegaly LYMPHATICS: no LAN in the neck, no supraclavicular LAN RESPIRATORY: breathing is even & unlabored, BS CTAB CARDIAC: RRR, no murmur,no extra heart sounds, no edema GI: abdomen soft, normal BS, no masses, no tenderness, no hepatomegaly, no splenomegaly PSYCHIATRIC: the patient is alert & oriented to person, affect & behavior appropriate  LABS/RADIOLOGY: Labs reviewed: 07/26/13 WBC 9.7 hemoglobin 8.9 hematocrit 28.8 07/25/13 WBC 11.0 hemoglobin 8.6 hematocrit 43.1  Basic Metabolic Panel:  Recent Labs  07/18/13 1235 07/19/13 0518 07/20/13 0500  NA 135 135* 136*  K 4.1 4.2 4.2  CL 102 104 104  CO2 19 22 23   GLUCOSE 82 112* 161*  BUN 12 13 10   CREATININE 0.81 0.79 0.68  CALCIUM 9.6 8.2* 8.3*   Liver Function Tests:  Recent Labs  12/29/12 1140  05/17/13 1225 07/18/13 1235  AST 21 25 16   ALT 12 29 16   ALKPHOS 64 145* 95  BILITOT 0.8 0.6 0.5  PROT 7.8 8.0 7.6  ALBUMIN 4.2 3.5 3.1*   CBC:  Recent Labs  07/19/13 0518 07/20/13 0500 07/21/13 0645  WBC 9.6 12.8* 8.9  HGB 8.9* 8.8* 8.1*  HCT 26.1* 26.3* 23.8*  MCV 84.7 85.1 84.7  PLT 384 415* 369     ASSESSMENT/PLAN:  Hypotension - decrease Diovan to 160 mg PO Q D; BP/HR Q shift x 1 week  Anemia - start FeSO4 325 mg PO BID and CBC in 1 week  CPT CODE: 54008

## 2013-08-09 DIAGNOSIS — Z452 Encounter for adjustment and management of vascular access device: Secondary | ICD-10-CM | POA: Diagnosis not present

## 2013-08-09 DIAGNOSIS — A4901 Methicillin susceptible Staphylococcus aureus infection, unspecified site: Secondary | ICD-10-CM

## 2013-08-09 DIAGNOSIS — T8450XA Infection and inflammatory reaction due to unspecified internal joint prosthesis, initial encounter: Secondary | ICD-10-CM

## 2013-08-10 DIAGNOSIS — Z452 Encounter for adjustment and management of vascular access device: Secondary | ICD-10-CM | POA: Diagnosis not present

## 2013-08-10 DIAGNOSIS — T8450XA Infection and inflammatory reaction due to unspecified internal joint prosthesis, initial encounter: Secondary | ICD-10-CM | POA: Diagnosis not present

## 2013-08-10 DIAGNOSIS — A4901 Methicillin susceptible Staphylococcus aureus infection, unspecified site: Secondary | ICD-10-CM | POA: Diagnosis not present

## 2013-08-11 DIAGNOSIS — T8450XA Infection and inflammatory reaction due to unspecified internal joint prosthesis, initial encounter: Secondary | ICD-10-CM | POA: Diagnosis not present

## 2013-08-11 DIAGNOSIS — A4901 Methicillin susceptible Staphylococcus aureus infection, unspecified site: Secondary | ICD-10-CM | POA: Diagnosis not present

## 2013-08-11 DIAGNOSIS — Z452 Encounter for adjustment and management of vascular access device: Secondary | ICD-10-CM | POA: Diagnosis not present

## 2013-08-13 DIAGNOSIS — T8450XA Infection and inflammatory reaction due to unspecified internal joint prosthesis, initial encounter: Secondary | ICD-10-CM | POA: Diagnosis not present

## 2013-08-13 DIAGNOSIS — A4901 Methicillin susceptible Staphylococcus aureus infection, unspecified site: Secondary | ICD-10-CM | POA: Diagnosis not present

## 2013-08-13 DIAGNOSIS — Z452 Encounter for adjustment and management of vascular access device: Secondary | ICD-10-CM | POA: Diagnosis not present

## 2013-08-15 DIAGNOSIS — I119 Hypertensive heart disease without heart failure: Secondary | ICD-10-CM | POA: Diagnosis not present

## 2013-08-15 DIAGNOSIS — A4901 Methicillin susceptible Staphylococcus aureus infection, unspecified site: Secondary | ICD-10-CM | POA: Diagnosis not present

## 2013-08-15 DIAGNOSIS — Z452 Encounter for adjustment and management of vascular access device: Secondary | ICD-10-CM | POA: Diagnosis not present

## 2013-08-15 DIAGNOSIS — T8450XA Infection and inflammatory reaction due to unspecified internal joint prosthesis, initial encounter: Secondary | ICD-10-CM | POA: Diagnosis not present

## 2013-08-16 DIAGNOSIS — A4901 Methicillin susceptible Staphylococcus aureus infection, unspecified site: Secondary | ICD-10-CM | POA: Diagnosis not present

## 2013-08-16 DIAGNOSIS — T8450XA Infection and inflammatory reaction due to unspecified internal joint prosthesis, initial encounter: Secondary | ICD-10-CM | POA: Diagnosis not present

## 2013-08-16 DIAGNOSIS — Z452 Encounter for adjustment and management of vascular access device: Secondary | ICD-10-CM | POA: Diagnosis not present

## 2013-08-18 DIAGNOSIS — T8450XA Infection and inflammatory reaction due to unspecified internal joint prosthesis, initial encounter: Secondary | ICD-10-CM | POA: Diagnosis not present

## 2013-08-18 DIAGNOSIS — Z452 Encounter for adjustment and management of vascular access device: Secondary | ICD-10-CM | POA: Diagnosis not present

## 2013-08-18 DIAGNOSIS — A4901 Methicillin susceptible Staphylococcus aureus infection, unspecified site: Secondary | ICD-10-CM | POA: Diagnosis not present

## 2013-08-22 DIAGNOSIS — T8450XA Infection and inflammatory reaction due to unspecified internal joint prosthesis, initial encounter: Secondary | ICD-10-CM | POA: Diagnosis not present

## 2013-08-22 DIAGNOSIS — A4901 Methicillin susceptible Staphylococcus aureus infection, unspecified site: Secondary | ICD-10-CM | POA: Diagnosis not present

## 2013-08-22 DIAGNOSIS — Z452 Encounter for adjustment and management of vascular access device: Secondary | ICD-10-CM | POA: Diagnosis not present

## 2013-08-24 IMAGING — CR DG CHEST 2V
3 series · 3 of 3 positions shown · non-contrast
Comparison: 10/14/2010

CLINICAL DATA: Preoperative evaluation for right knee arthroplasty,
history hypertension

CHEST - 2 VIEW

[w chest pa]
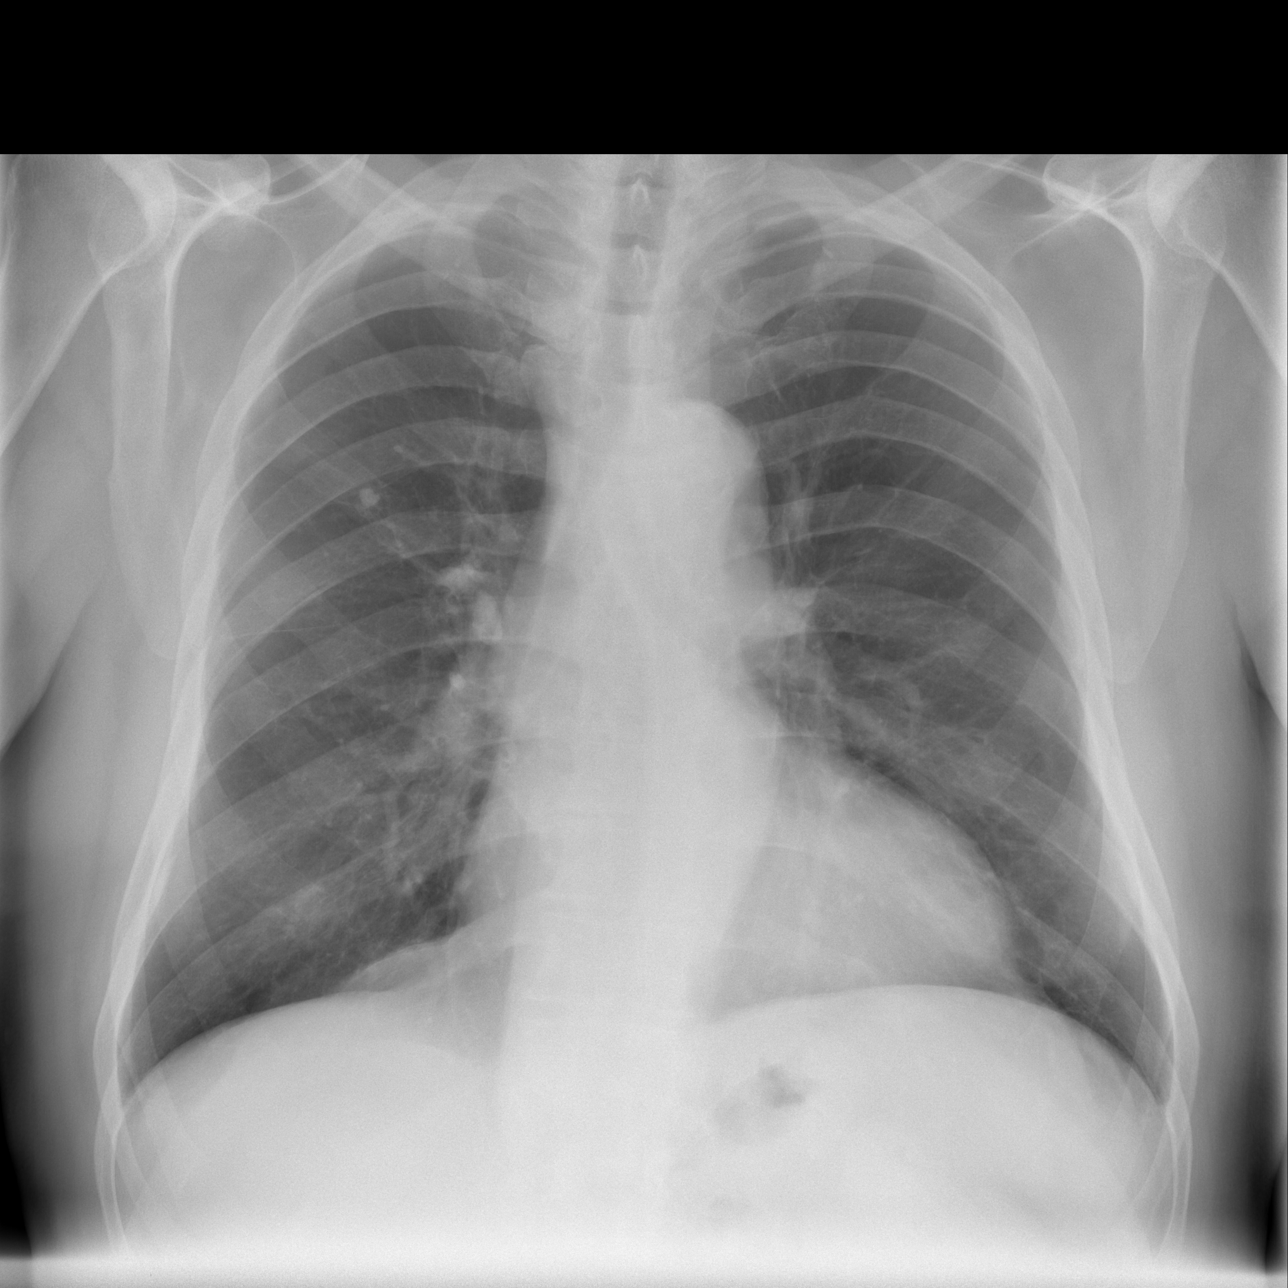

[w chest lat (1 of 2)]
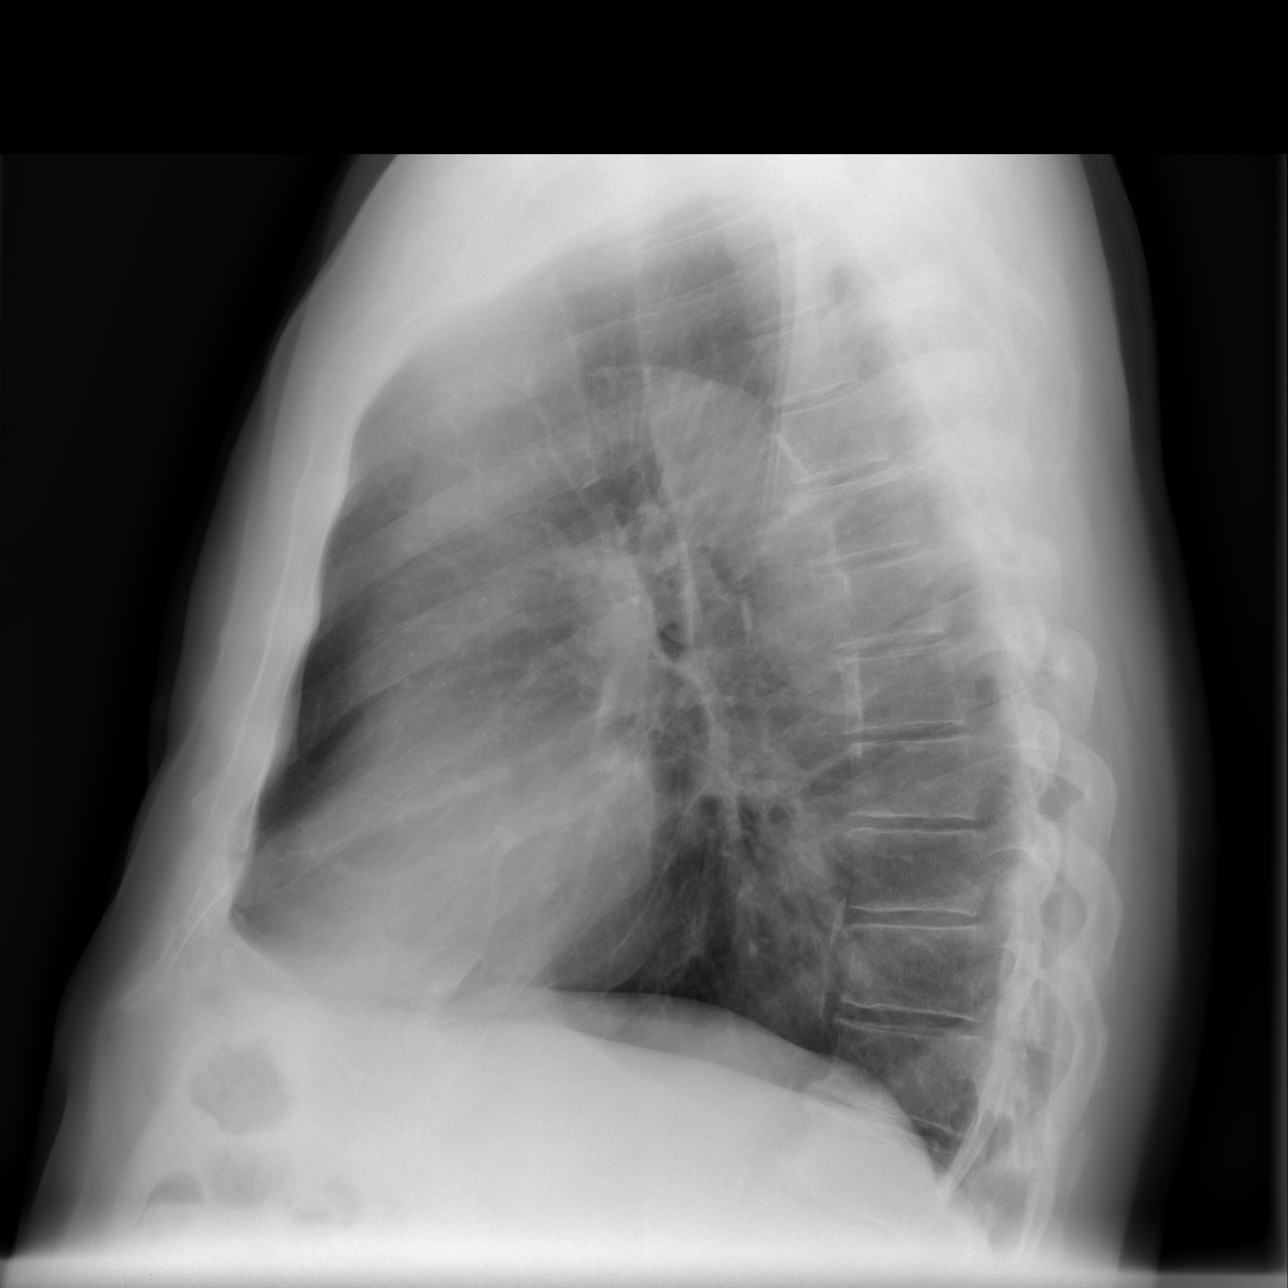

[w chest lat (2 of 2)]
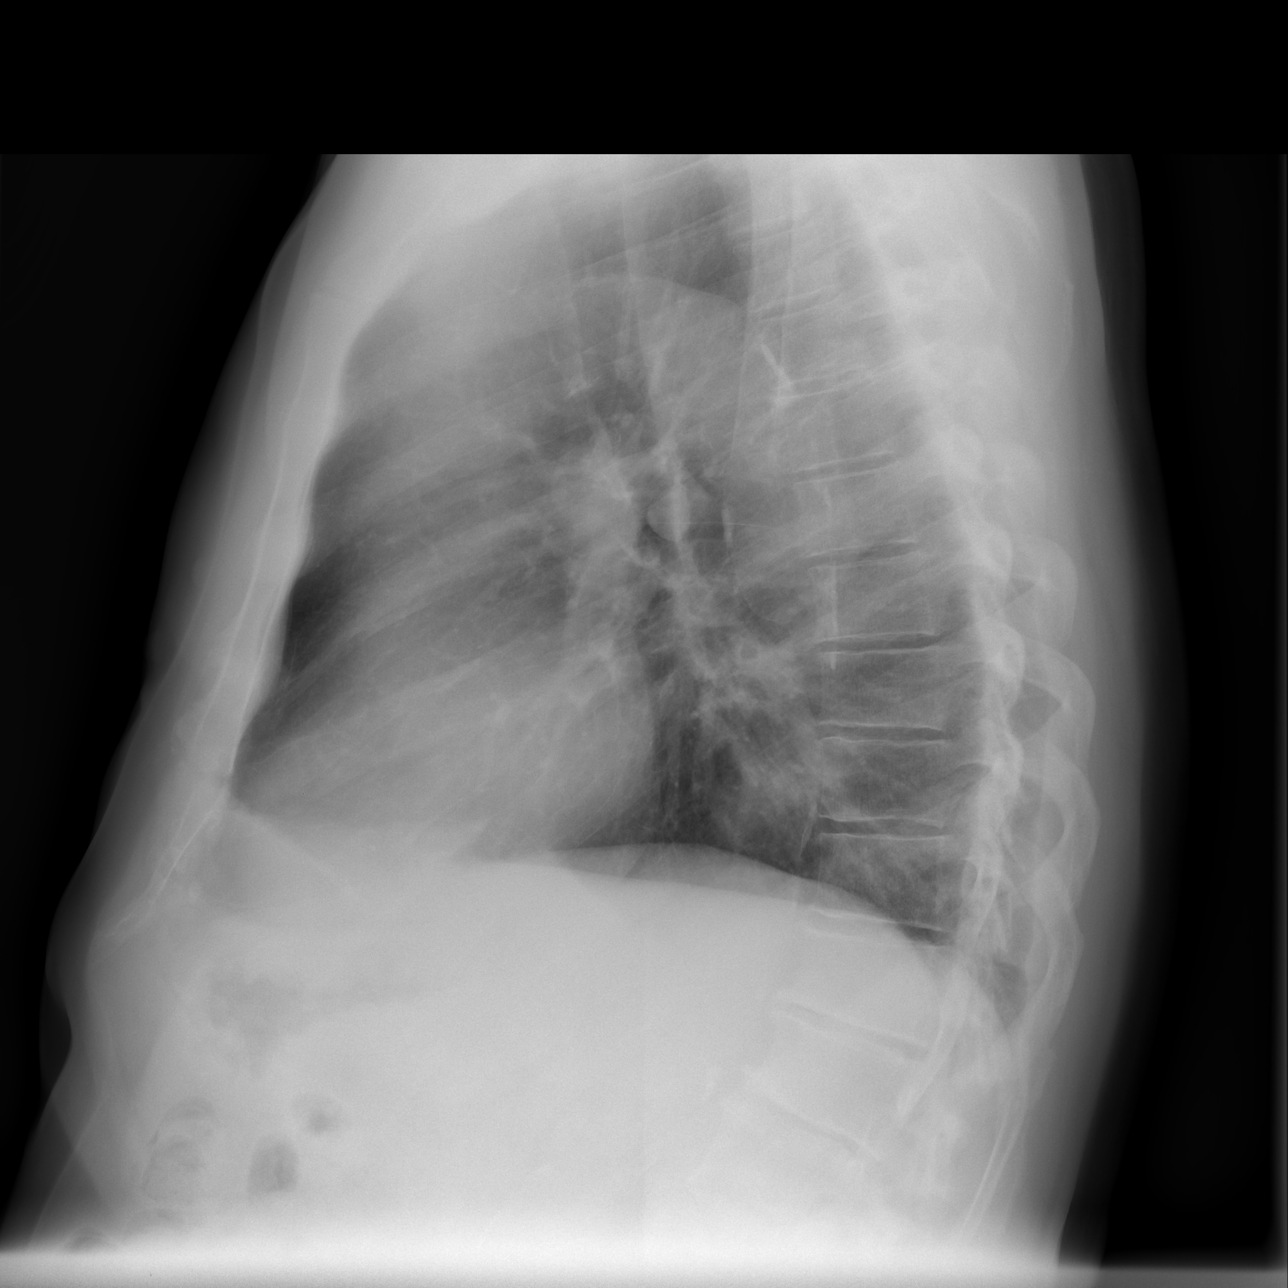

[3 of 3 positions shown; findings below may reference images not displayed]

FINDINGS: Upper normal heart size.
Tortuous aorta.
Pulmonary vascularity normal.
Calcified granuloma right upper lobe with calcified right hilar
adenopathy.
Lungs otherwise clear.
No pleural effusion or pneumothorax.
Bones unremarkable.
IMPRESSION: Old granulomatous disease.
No acute abnormalities.

## 2013-08-26 DIAGNOSIS — A4901 Methicillin susceptible Staphylococcus aureus infection, unspecified site: Secondary | ICD-10-CM | POA: Diagnosis not present

## 2013-08-26 DIAGNOSIS — T8450XA Infection and inflammatory reaction due to unspecified internal joint prosthesis, initial encounter: Secondary | ICD-10-CM | POA: Diagnosis not present

## 2013-08-26 DIAGNOSIS — Z452 Encounter for adjustment and management of vascular access device: Secondary | ICD-10-CM | POA: Diagnosis not present

## 2013-08-29 DIAGNOSIS — T8450XA Infection and inflammatory reaction due to unspecified internal joint prosthesis, initial encounter: Secondary | ICD-10-CM | POA: Diagnosis not present

## 2013-08-29 DIAGNOSIS — Z452 Encounter for adjustment and management of vascular access device: Secondary | ICD-10-CM | POA: Diagnosis not present

## 2013-08-29 DIAGNOSIS — A4901 Methicillin susceptible Staphylococcus aureus infection, unspecified site: Secondary | ICD-10-CM | POA: Diagnosis not present

## 2013-08-31 ENCOUNTER — Encounter: Payer: Self-pay | Admitting: Infectious Diseases

## 2013-08-31 ENCOUNTER — Ambulatory Visit (INDEPENDENT_AMBULATORY_CARE_PROVIDER_SITE_OTHER): Payer: Medicare Other | Admitting: Infectious Diseases

## 2013-08-31 ENCOUNTER — Telehealth: Payer: Self-pay | Admitting: *Deleted

## 2013-08-31 VITALS — BP 135/66 | HR 64 | Temp 97.5°F | Ht 72.0 in | Wt 177.0 lb

## 2013-08-31 DIAGNOSIS — M009 Pyogenic arthritis, unspecified: Secondary | ICD-10-CM | POA: Diagnosis not present

## 2013-08-31 MED ORDER — CEPHALEXIN 500 MG PO CAPS: 500.0000 mg | ORAL_CAPSULE | Freq: Two times a day (BID) | ORAL | Status: DC

## 2013-08-31 NOTE — Progress Notes (Signed)
Subjective:    Patient ID: Roger Cameron, male    DOB: 12/16/1928, 80 y.o.   MRN: [id number]  HPI 80 y.o. male with right knee arthroscopy, replacement in June 2014, post-op hematoma, irrigation and debridement in July 2014, swelling again in October 2014 with I and D (no infection). He returned to [facility] on 12-29 after aspiration in office noted purulence with CoNS growth. He underwent resection arthroplasty 07/18/13 with antibiotic spacer placement. His Cx grew MSSA. His ESR was 66 and his CRP was 12.8. He was d/c home on 07-21-13 on IV ancef with plan to complete therapy on 08-29-13.  He had f/u ESR 23 and CRP 1.5 (08-29-13).  Still has some knee pain with ambulation. Otherwise he is pain free. Still feels warm, still swollen. Planning for re-implantation 09-16-13. No problems with PIC. Ready to have it removed.   Review of Systems     Objective:   Physical Exam  Constitutional: He appears well-developed and well-nourished.  Musculoskeletal:       Arms:      Legs:         Assessment & Plan:   

## 2013-08-31 NOTE — Telephone Encounter (Signed)
Gave verbal order to [facility] pharmacy to pull PICC.  Per pharmacy, nursing will be out today or tomorrow to do so. Landis Gandy, RN

## 2013-08-31 NOTE — Assessment & Plan Note (Signed)
I am encouraged that his inflammatory markers have dramatically improved. His CRP is still not normal so will start him on keflex until he has surgery.repeat Cx at time of surgery may be useful.  Pull his PIC today. Plan for re-implantation as scheduled. F/u prn.

## 2013-09-01 ENCOUNTER — Encounter (HOSPITAL_COMMUNITY): Payer: Self-pay | Admitting: Pharmacy Technician

## 2013-09-01 DIAGNOSIS — T8450XA Infection and inflammatory reaction due to unspecified internal joint prosthesis, initial encounter: Secondary | ICD-10-CM | POA: Diagnosis not present

## 2013-09-01 DIAGNOSIS — Z452 Encounter for adjustment and management of vascular access device: Secondary | ICD-10-CM | POA: Diagnosis not present

## 2013-09-01 DIAGNOSIS — A4901 Methicillin susceptible Staphylococcus aureus infection, unspecified site: Secondary | ICD-10-CM | POA: Diagnosis not present

## 2013-09-08 ENCOUNTER — Other Ambulatory Visit: Payer: Self-pay | Admitting: Orthopedic Surgery

## 2013-09-08 NOTE — Pre-Procedure Instructions (Signed)
Roger Cameron  09/08/2013   Your procedure is scheduled on:  09-16-2013  Friday   Report to [facility]  2 * 3 at 9:45 AM.   Call this number if you have problems the morning of surgery: [phone]   Remember:   Do not eat food or drink liquids after midnight.    Take these medicines the morning of surgery with A SIP OF WATER: Alprazolam if needed,atenolol(Tenormin),prevacid,xalatan eye drops,pain medication as needed   Do not wear jewelry, make-up or nail polish.   Do not wear lotions, powders, or perfumes.   Do not shave 48 hours prior to surgery. Men may shave face and neck.  Do not bring valuables to the hospital.  [facility] is not responsible for any belongings or valuables.               Contacts, dentures or bridgework may not be worn into surgery.  Leave suitcase in the car. After surgery it may be brought to your room.  For patients admitted to the hospital, discharge time is determined by your treatment team.               Patients discharged the day of surgery will not be allowed to drive home.    Special Instructions: Special Instructions: [facility] - Preparing for Surgery  Before surgery, you can play an important role.  Because skin is not sterile, your skin needs to be as free of germs as possible.  You can reduce the number of germs on you skin by washing with CHG (chlorahexidine gluconate) soap before surgery.  CHG is an antiseptic cleaner which kills germs and bonds with the skin to continue killing germs even after washing.  Please DO NOT use if you have an allergy to CHG or antibacterial soaps.  If your skin becomes reddened/irritated stop using the CHG and inform your nurse when you arrive at Short Stay.  Do not shave (including legs and underarms) for at least 48 hours prior to the first CHG shower.  You may shave your face.  Please follow these instructions carefully:   1.  Shower with CHG Soap the night before surgery and  the   morning of Surgery.  2.  If you choose to wash your hair, wash your hair first as usual with your normal shampoo.  3.  After you shampoo, rinse your hair and body thoroughly to remove the  Shampoo.  4.  Use CHG as you would any other liquid soap.  You can apply chg directly  to the skin and wash gently with scrungie or a clean washcloth.  5.  Apply the CHG Soap to your body ONLY FROM THE NECK DOWN.   Do not use on open wounds or open sores.  Avoid contact with your eyes,  ears, mouth and genitals (private parts).  Wash genitals (private parts) with your normal soap.  6.  Wash thoroughly, paying special attention to the area where your surgery will be performed.  7.  Thoroughly rinse your body with warm water from the neck down.  8.  DO NOT shower/wash with your normal soap after using and rinsing o  the CHG Soap.  9.  Pat yourself dry with a clean towel.            10.  Wear clean pajamas.            11.  Place clean sheets on your bed the night of your  first shower and do not sleep with pets.  Day of Surgery  Do not apply any lotions/deodorants the morning of surgery.  Please wear clean clothes to the hospital/surgery center.     Please read over the following fact sheets that you were given: Pain Booklet, Coughing and Deep Breathing, Blood Transfusion Information and Surgical Site Infection Prevention

## 2013-09-09 ENCOUNTER — Encounter (HOSPITAL_COMMUNITY)
Admission: RE | Admit: 2013-09-09 | Discharge: 2013-09-09 | Disposition: A | Discharge status: Home / Self Care | Payer: Medicare Other | Source: Ambulatory Visit | Attending: Orthopedic Surgery | Admitting: Orthopedic Surgery

## 2013-09-09 ENCOUNTER — Encounter (HOSPITAL_COMMUNITY): Payer: Self-pay

## 2013-09-09 DIAGNOSIS — Z01812 Encounter for preprocedural laboratory examination: Secondary | ICD-10-CM | POA: Diagnosis not present

## 2013-09-09 HISTORY — DX: Anemia, unspecified: D64.9

## 2013-09-09 LAB — URINE MICROSCOPIC-ADD ON

## 2013-09-09 LAB — URINALYSIS, ROUTINE W REFLEX MICROSCOPIC
Bilirubin Urine: NEGATIVE
GLUCOSE, UA: NEGATIVE mg/dL
Hgb urine dipstick: NEGATIVE
KETONES UR: NEGATIVE mg/dL
Nitrite: NEGATIVE
PROTEIN: NEGATIVE mg/dL
Specific Gravity, Urine: 1.01 (ref 1.005–1.030)
UROBILINOGEN UA: 0.2 mg/dL (ref 0.0–1.0)
pH: 7 (ref 5.0–8.0)

## 2013-09-09 LAB — TYPE AND SCREEN
ABO/RH(D): O POS
ANTIBODY SCREEN: NEGATIVE

## 2013-09-09 LAB — COMPREHENSIVE METABOLIC PANEL
ALK PHOS: 89 U/L (ref 39–117)
ALT: 14 U/L (ref 0–53)
AST: 20 U/L (ref 0–37)
Albumin: 3.9 g/dL (ref 3.5–5.2)
BUN: 24 mg/dL — ABNORMAL HIGH (ref 6–23)
CALCIUM: 9.9 mg/dL (ref 8.4–10.5)
CO2: 22 mEq/L (ref 19–32)
Chloride: 94 mEq/L — ABNORMAL LOW (ref 96–112)
Creatinine, Ser: 0.73 mg/dL (ref 0.50–1.35)
GFR calc Af Amer: >90 mL/min (ref >90)
GFR calc non Af Amer: 84 mL/min — ABNORMAL LOW (ref >90)
Glucose, Bld: 95 mg/dL (ref 70–99)
POTASSIUM: 4.6 meq/L (ref 3.7–5.3)
SODIUM: 134 meq/L — AB (ref 137–147)
Total Bilirubin: 0.3 mg/dL (ref 0.3–1.2)
Total Protein: 8.3 g/dL (ref 6.0–8.3)

## 2013-09-09 LAB — SURGICAL PCR SCREEN
MRSA, PCR: NEGATIVE
Staphylococcus aureus: NEGATIVE

## 2013-09-09 LAB — CBC
HCT: 38.6 % — ABNORMAL LOW (ref 39.0–52.0)
Hemoglobin: 13.3 g/dL (ref 13.0–17.0)
MCH: 29.8 pg (ref 26.0–34.0)
MCHC: 34.5 g/dL (ref 30.0–36.0)
MCV: 86.4 fL (ref 78.0–100.0)
Platelets: 307 10*3/uL (ref 150–400)
RBC: 4.47 MIL/uL (ref 4.22–5.81)
RDW: 13.6 % (ref 11.5–15.5)
WBC: 10.7 10*3/uL — ABNORMAL HIGH (ref 4.0–10.5)

## 2013-09-09 LAB — PROTIME-INR
INR: 1.13 (ref 0.00–1.49)
Prothrombin Time: 14.3 seconds (ref 11.6–15.2)

## 2013-09-09 LAB — ABO/RH: ABO/RH(D): O POS

## 2013-09-09 LAB — APTT: APTT: 34 s (ref 24–37)

## 2013-09-13 ENCOUNTER — Encounter: Payer: Self-pay | Admitting: Internal Medicine

## 2013-09-13 ENCOUNTER — Other Ambulatory Visit: Payer: Self-pay | Admitting: Surgical

## 2013-09-13 NOTE — H&P (Signed)
TOTAL KNEE REVISION ADMISSION H&P  Patient is being admitted for right total knee reimplantation following right total knee resection arthroplasty.  Subjective:  Chief Complaint:right knee pain.  HPI: Roger Cameron, 80 y.o. male, has a history of pain and functional disability in the right knee(s) due to failed previous arthroplasty and patient has failed non-surgical conservative treatments for greater than 12 weeks to include NSAID's and/or analgesics, corticosteriod injections, supervised PT with diminished ADL's post treatment, use of assistive devices, weight reduction as appropriate and activity modification. The indications for the revision of the total knee arthroplasty are loosening of one or more components and history of total knee infection. Onset of symptoms was gradual starting 2 years ago with gradually worsening course since that time.  Prior procedures on the right knee(s) include unicompartmental arthroplasty.  Patient currently rates pain in the right knee(s) at 8 out of 10 with activity. There is night pain, worsening of pain with activity and weight bearing, pain that interferes with activities of daily living and joint swelling.  Patient has evidence of right knee resection with placement of antibiotic spacer by imaging studies. This condition presents safety issues increasing the risk of falls. This patient has had failure of unicompartmental arthroplasty.  There is no current active infection.  Patient Active Problem List   Diagnosis Date Noted  . Hypotension 08/08/2013  . Acute posthemorrhagic anemia 07/22/2013  . Septic arthritis of knee 07/18/2013  . Impaired hearing 05/17/2013  . Generalized anxiety disorder 05/17/2013  . Glaucoma 05/17/2013  . GERD (gastroesophageal reflux disease) 05/17/2013  . Unspecified essential hypertension 05/17/2013  . Hyponatremia 02/04/2013  . Anemia 02/04/2013  . Right knee skin infection 02/01/2013  . OA (osteoarthritis) of knee  01/03/2013   Past Medical History  Diagnosis Date  . Impaired hearing     NO HEARING AIDS  . Anxiety   . Glaucoma     BILATERAL  . Cataract     LEFT EYE  . Hypertension   . GERD (gastroesophageal reflux disease)   . Arthritis   . Infection 07/15/13    RIGHT KNEE INFECTION  S/P RT UNIKNEE REPLACEMENT ON 01/03/13 - STATES HIS KNEE IS VERY SWOLLEN   . Anemia     was put on iron @ [facility]    Past Surgical History  Procedure Laterality Date  . Right knee arthroscopy  x 2    . Hernia repair      RIGHT INGUINAL HERNAI REPAIR  . Tonsillectomy      AS A CHILD  . Eye surgery      RIGHT EYE CATARACT REMOVED  . Partial knee arthroplasty Right 01/03/2013    Procedure: RIGHT KNEE MEDIAL COMPARTMENT UNI COMPARMENTAL ARTHROPLASTY;  Surgeon: [name], MD;  Location: [facility];  Service: Orthopedics;  Laterality: Right;  . Irrigation and debridement knee Right 02/02/2013    Procedure: IRRIGATION AND DEBRIDEMENT KNEE;  Surgeon: [name], MD;  Location: [facility];  Service: Orthopedics;  Laterality: Right;  . Irrigation and debridement knee Right 05/17/2013    Procedure: IRRIGATION AND DEBRIDEMENT RIGHT KNEE;  Surgeon: [name], MD;  Location: [facility];  Service: Orthopedics;  Laterality: Right;  STIMULIN BEADS  . Excisional total knee arthroplasty with antibiotic spacers Right 07/18/2013    Procedure: RIGHT KNEE RESECTION ARTHROPLASTY WITH ANTIBIOTIC SPACERS ;  Surgeon: [name], MD;  Location: [facility];  Service: Orthopedics;  Laterality: Right;    Current outpatient prescriptions: ALPRAZolam (XANAX) 0.5 MG tablet,  Take 0.5 mg by mouth daily as needed for anxiety., Disp: , Rfl: ;   aspirin EC 325 MG EC tablet, Take 1 tablet (325 mg total) by mouth daily., Disp: 42 tablet, Rfl: 0;   atenolol (TENORMIN) 50 MG tablet, Take 25 mg by mouth every morning. Takes 1/2 tablet, Disp: , Rfl: ;   Carbonyl Iron (FEOSOL PO), Take 1 tablet by mouth daily., Disp: , Rfl:  cephALEXin  (KEFLEX) 500 MG capsule, Take 1 capsule (500 mg total) by mouth 2 (two) times daily., Disp: 30 capsule, Rfl: 0;   hydrochlorothiazide (HYDRODIURIL) 25 MG tablet, Take 25 mg by mouth every morning., Disp: , Rfl: ;   lansoprazole (PREVACID) 30 MG capsule, Take 30 mg by mouth daily. , Disp: , Rfl: ;   latanoprost (XALATAN) 0.005 % ophthalmic solution, Place 1 drop into both eyes at bedtime., Disp: , Rfl:  oxyCODONE (OXY IR/ROXICODONE) 5 MG immediate release tablet, Take 1-2 tablets (5-10 mg total) by mouth every 3 (three) hours as needed for moderate pain or severe pain. Take 1 tablet  + 5 mg by mouth every 3 hours as needed  for moderate pain; take 2 tablets = 10 mg by mouth every 3 hours as need for severe pain, Disp: 240 tablet, Rfl: 0 polyethylene glycol (MIRALAX / GLYCOLAX) packet, Take 17 g by mouth daily as needed for mild constipation., Disp: 14 each, Rfl: 0;   valsartan (DIOVAN) 320 MG tablet, Take 320 mg by mouth every morning. , Disp: , Rfl:   Allergies  Allergen Reactions  . Adhesive [Tape] Rash    blisters    History  Substance Use Topics  . Smoking status: Never Smoker   . Smokeless tobacco: Never Used  . Alcohol Use: Yes     Comment: RARE BEER       Review of Systems  Constitutional: Negative.   HENT: Negative.   Eyes: Negative.   Respiratory: Negative.   Cardiovascular: Negative.   Gastrointestinal: Negative.   Genitourinary: Negative.   Musculoskeletal: Positive for back pain and joint pain. Negative for falls, myalgias and neck pain.       Right knee pain  Skin: Negative.   Neurological: Negative.   Endo/Heme/Allergies: Negative.   Psychiatric/Behavioral: Negative.      Objective:  Physical Exam  Constitutional: He is oriented to person, place, and time. He appears well-developed and well-nourished. No distress.  HENT:  Head: Normocephalic and atraumatic.  Right Ear: External ear normal.  Left Ear: External ear normal.  Nose: Nose normal.  Mouth/Throat:  Oropharynx is clear and moist.  Eyes: Conjunctivae and EOM are normal.  Neck: Normal range of motion. Neck supple.  Cardiovascular: Normal rate, regular rhythm, normal heart sounds and intact distal pulses.   No murmur heard. Respiratory: Effort normal and breath sounds normal. No respiratory distress. He has no wheezes.  GI: Soft. Bowel sounds are normal. He exhibits no distension. There is no tenderness.  Musculoskeletal:       Right hip: Normal.       Left hip: Normal.       Right knee: He exhibits decreased range of motion and swelling. He exhibits no effusion and no erythema. Tenderness found.       Left knee: Normal.       Right lower leg: He exhibits no tenderness and no swelling.       Left lower leg: He exhibits no tenderness and no swelling.  Neurological: He is alert and oriented to person, place, and  time. He has normal strength and normal reflexes. No sensory deficit.  Skin: No rash noted. He is not diaphoretic. No erythema.  Psychiatric: He has a normal mood and affect. His behavior is normal.   Vitals Weight: 175 lb Height: 72 in Body Surface Area: 2.01 m Body Mass Index: 23.73 kg/m Pulse: 68 (Regular) BP: 128/72 (Sitting, Left Arm, Standard)  Imaging Review Plain radiographs demonstrate severe degenerative joint disease of the right knee(s). The overall alignment is neutral. The bone quality appears to be satisfactory for age and reported activity level.   Assessment/Plan:  End stage arthritis, right knee(s) with failed previous arthroplasty.   The patient history, physical examination, clinical judgment of the provider and imaging studies are consistent with end stage degenerative joint disease of the right knee(s), previous total knee arthroplasty. Revision total knee arthroplasty is deemed medically necessary. The treatment options including medical management, injection therapy, arthroscopy and revision arthroplasty were discussed at length. The risks  and benefits of revision total knee arthroplasty were presented and reviewed. The risks due to aseptic loosening, infection, stiffness, patella tracking problems, thromboembolic complications and other imponderables were discussed. The patient acknowledged the explanation, agreed to proceed with the plan and consent was signed. Patient is being admitted for inpatient treatment for surgery, pain control, PT, OT, prophylactic antibiotics, VTE prophylaxis, progressive ambulation and ADL's and discharge planning.The patient is planning to be discharged home with home health services vs SNF (prefers [facility] if SNF needed).   PCP: Dr. Rosana Hoes  Patient to receive TXA   Ardeen Jourdain, PA-C

## 2013-09-14 ENCOUNTER — Encounter: Payer: Self-pay | Admitting: Infectious Diseases

## 2013-09-15 MED ORDER — CEFAZOLIN SODIUM-DEXTROSE 2-3 GM-% IV SOLR
2.0000 g | INTRAVENOUS | Status: AC
  Administered 2013-09-16: 2 g via INTRAVENOUS
  Filled 2013-09-15: qty 50

## 2013-09-15 MED ORDER — ACETAMINOPHEN 10 MG/ML IV SOLN: 1000.0000 mg | Freq: Once | INTRAVENOUS | Status: DC

## 2013-09-15 MED ORDER — SODIUM CHLORIDE 0.9 % IV SOLN
1000.0000 mg | INTRAVENOUS | Status: AC
  Administered 2013-09-16: 1000 mg via INTRAVENOUS
  Filled 2013-09-15: qty 10

## 2013-09-15 MED ORDER — DEXAMETHASONE SODIUM PHOSPHATE 10 MG/ML IJ SOLN
10.0000 mg | Freq: Once | INTRAMUSCULAR | Status: DC
  Filled 2013-09-15: qty 1

## 2013-09-15 MED ORDER — BUPIVACAINE LIPOSOME 1.3 % IJ SUSP
20.0000 mL | Freq: Once | INTRAMUSCULAR | Status: DC
  Filled 2013-09-15: qty 20

## 2013-09-16 ENCOUNTER — Inpatient Hospital Stay (HOSPITAL_COMMUNITY)
Admission: RE | Admit: 2013-09-16 | Discharge: 2013-09-19 | DRG: 468 | Disposition: A | Discharge status: Home Health | Payer: Medicare Other | Source: Ambulatory Visit | Attending: Orthopedic Surgery | Admitting: Orthopedic Surgery

## 2013-09-16 ENCOUNTER — Encounter (HOSPITAL_COMMUNITY): Payer: Medicare Other | Admitting: Anesthesiology

## 2013-09-16 ENCOUNTER — Inpatient Hospital Stay (HOSPITAL_COMMUNITY): Payer: Medicare Other | Admitting: Anesthesiology

## 2013-09-16 ENCOUNTER — Encounter (HOSPITAL_COMMUNITY)
Admission: RE | Disposition: A | Discharge status: Home Health | Payer: Self-pay | Source: Ambulatory Visit | Attending: Orthopedic Surgery

## 2013-09-16 ENCOUNTER — Encounter (HOSPITAL_COMMUNITY): Payer: Self-pay | Admitting: Anesthesiology

## 2013-09-16 DIAGNOSIS — I10 Essential (primary) hypertension: Secondary | ICD-10-CM | POA: Diagnosis present

## 2013-09-16 DIAGNOSIS — M1710 Unilateral primary osteoarthritis, unspecified knee: Secondary | ICD-10-CM | POA: Diagnosis not present

## 2013-09-16 DIAGNOSIS — H9190 Unspecified hearing loss, unspecified ear: Secondary | ICD-10-CM | POA: Diagnosis present

## 2013-09-16 DIAGNOSIS — H409 Unspecified glaucoma: Secondary | ICD-10-CM | POA: Diagnosis present

## 2013-09-16 DIAGNOSIS — Z7982 Long term (current) use of aspirin: Secondary | ICD-10-CM | POA: Diagnosis not present

## 2013-09-16 DIAGNOSIS — Y831 Surgical operation with implant of artificial internal device as the cause of abnormal reaction of the patient, or of later complication, without mention of misadventure at the time of the procedure: Secondary | ICD-10-CM | POA: Diagnosis present

## 2013-09-16 DIAGNOSIS — Z79899 Other long term (current) drug therapy: Secondary | ICD-10-CM | POA: Diagnosis not present

## 2013-09-16 DIAGNOSIS — T84099A Other mechanical complication of unspecified internal joint prosthesis, initial encounter: Secondary | ICD-10-CM | POA: Diagnosis not present

## 2013-09-16 DIAGNOSIS — F411 Generalized anxiety disorder: Secondary | ICD-10-CM | POA: Diagnosis present

## 2013-09-16 DIAGNOSIS — M25569 Pain in unspecified knee: Secondary | ICD-10-CM | POA: Diagnosis not present

## 2013-09-16 DIAGNOSIS — Z96659 Presence of unspecified artificial knee joint: Secondary | ICD-10-CM | POA: Diagnosis not present

## 2013-09-16 DIAGNOSIS — K219 Gastro-esophageal reflux disease without esophagitis: Secondary | ICD-10-CM | POA: Diagnosis present

## 2013-09-16 DIAGNOSIS — Z9849 Cataract extraction status, unspecified eye: Secondary | ICD-10-CM

## 2013-09-16 DIAGNOSIS — M179 Osteoarthritis of knee, unspecified: Secondary | ICD-10-CM | POA: Diagnosis present

## 2013-09-16 DIAGNOSIS — T8450XA Infection and inflammatory reaction due to unspecified internal joint prosthesis, initial encounter: Secondary | ICD-10-CM | POA: Diagnosis not present

## 2013-09-16 DIAGNOSIS — IMO0002 Reserved for concepts with insufficient information to code with codable children: Secondary | ICD-10-CM | POA: Diagnosis not present

## 2013-09-16 HISTORY — PX: REIMPLANTATION OF TOTAL KNEE: SHX6052

## 2013-09-16 LAB — GRAM STAIN

## 2013-09-16 SURGERY — REVISION, TOTAL ARTHROPLASTY, KNEE
Anesthesia: General | Site: Knee | Laterality: Right

## 2013-09-16 MED ORDER — 0.9 % SODIUM CHLORIDE (POUR BTL) OPTIME
TOPICAL | Status: DC | PRN
  Administered 2013-09-16: 1000 mL

## 2013-09-16 MED ORDER — HYDROCHLOROTHIAZIDE 25 MG PO TABS
25.0000 mg | ORAL_TABLET | Freq: Every morning | ORAL | Status: DC
  Administered 2013-09-18: 25 mg via ORAL
  Filled 2013-09-16 (×3): qty 1

## 2013-09-16 MED ORDER — ACETAMINOPHEN 500 MG PO TABS
1000.0000 mg | ORAL_TABLET | Freq: Four times a day (QID) | ORAL | Status: AC
  Administered 2013-09-16 – 2013-09-17 (×4): 1000 mg via ORAL
  Filled 2013-09-16 (×4): qty 2

## 2013-09-16 MED ORDER — ARTIFICIAL TEARS OP OINT
TOPICAL_OINTMENT | OPHTHALMIC | Status: AC
  Filled 2013-09-16: qty 3.5

## 2013-09-16 MED ORDER — LATANOPROST 0.005 % OP SOLN
1.0000 [drp] | Freq: Every day | OPHTHALMIC | Status: DC
  Administered 2013-09-16 – 2013-09-18 (×3): 1 [drp] via OPHTHALMIC
  Filled 2013-09-16: qty 2.5

## 2013-09-16 MED ORDER — FENTANYL CITRATE 0.05 MG/ML IJ SOLN
INTRAMUSCULAR | Status: AC
  Filled 2013-09-16: qty 2

## 2013-09-16 MED ORDER — METHOCARBAMOL 500 MG PO TABS
ORAL_TABLET | ORAL | Status: AC
  Filled 2013-09-16: qty 1

## 2013-09-16 MED ORDER — POLYETHYLENE GLYCOL 3350 17 G PO PACK: 17.0000 g | PACK | Freq: Every day | ORAL | Status: DC | PRN

## 2013-09-16 MED ORDER — OXYCODONE HCL 5 MG PO TABS: 5.0000 mg | ORAL_TABLET | ORAL | Status: AC | PRN

## 2013-09-16 MED ORDER — METOCLOPRAMIDE HCL 10 MG PO TABS: 5.0000 mg | ORAL_TABLET | Freq: Three times a day (TID) | ORAL | Status: DC | PRN

## 2013-09-16 MED ORDER — OXYCODONE HCL 5 MG PO TABS
5.0000 mg | ORAL_TABLET | Freq: Once | ORAL | Status: AC | PRN
  Administered 2013-09-16: 5 mg via ORAL

## 2013-09-16 MED ORDER — METOCLOPRAMIDE HCL 5 MG/ML IJ SOLN: 5.0000 mg | Freq: Three times a day (TID) | INTRAMUSCULAR | Status: DC | PRN

## 2013-09-16 MED ORDER — METHOCARBAMOL 100 MG/ML IJ SOLN
500.0000 mg | Freq: Four times a day (QID) | INTRAMUSCULAR | Status: DC | PRN
  Filled 2013-09-16: qty 5

## 2013-09-16 MED ORDER — DIPHENHYDRAMINE HCL 12.5 MG/5ML PO ELIX: 12.5000 mg | ORAL_SOLUTION | ORAL | Status: DC | PRN

## 2013-09-16 MED ORDER — DEXAMETHASONE SODIUM PHOSPHATE 10 MG/ML IJ SOLN
10.0000 mg | Freq: Every day | INTRAMUSCULAR | Status: AC
  Filled 2013-09-16: qty 1

## 2013-09-16 MED ORDER — HYDROMORPHONE HCL PF 1 MG/ML IJ SOLN
0.5000 mg | Freq: Once | INTRAMUSCULAR | Status: AC
  Administered 2013-09-16: 0.5 mg via INTRAVENOUS

## 2013-09-16 MED ORDER — MORPHINE SULFATE 2 MG/ML IJ SOLN
INTRAMUSCULAR | Status: AC
  Filled 2013-09-16: qty 1

## 2013-09-16 MED ORDER — ASPIRIN EC 325 MG PO TBEC: 325.0000 mg | DELAYED_RELEASE_TABLET | Freq: Every day | ORAL | Status: DC

## 2013-09-16 MED ORDER — HYDROMORPHONE HCL PF 1 MG/ML IJ SOLN
INTRAMUSCULAR | Status: AC
  Filled 2013-09-16: qty 1

## 2013-09-16 MED ORDER — CEFAZOLIN SODIUM-DEXTROSE 2-3 GM-% IV SOLR
2.0000 g | Freq: Four times a day (QID) | INTRAVENOUS | Status: AC
  Administered 2013-09-17: 2 g via INTRAVENOUS
  Filled 2013-09-16 (×2): qty 50

## 2013-09-16 MED ORDER — MENTHOL 3 MG MT LOZG: 1.0000 | LOZENGE | OROMUCOSAL | Status: DC | PRN

## 2013-09-16 MED ORDER — OXYCODONE HCL 5 MG PO TABS
ORAL_TABLET | ORAL | Status: AC
  Filled 2013-09-16: qty 1

## 2013-09-16 MED ORDER — ROCURONIUM BROMIDE 100 MG/10ML IV SOLN
INTRAVENOUS | Status: DC | PRN
  Administered 2013-09-16: 10 mg via INTRAVENOUS
  Administered 2013-09-16: 40 mg via INTRAVENOUS

## 2013-09-16 MED ORDER — EPHEDRINE SULFATE 50 MG/ML IJ SOLN
INTRAMUSCULAR | Status: DC | PRN
  Administered 2013-09-16: 5 mg via INTRAVENOUS

## 2013-09-16 MED ORDER — FENTANYL CITRATE 0.05 MG/ML IJ SOLN
25.0000 ug | INTRAMUSCULAR | Status: DC | PRN
  Administered 2013-09-16 (×3): 50 ug via INTRAVENOUS

## 2013-09-16 MED ORDER — PANTOPRAZOLE SODIUM 40 MG PO TBEC
40.0000 mg | DELAYED_RELEASE_TABLET | Freq: Every day | ORAL | Status: DC
  Administered 2013-09-17 – 2013-09-19 (×3): 40 mg via ORAL
  Filled 2013-09-16 (×3): qty 1

## 2013-09-16 MED ORDER — FENTANYL CITRATE 0.05 MG/ML IJ SOLN
INTRAMUSCULAR | Status: AC
  Filled 2013-09-16: qty 5

## 2013-09-16 MED ORDER — ALPRAZOLAM 0.5 MG PO TABS
0.5000 mg | ORAL_TABLET | Freq: Every day | ORAL | Status: DC | PRN
  Administered 2013-09-18: 0.5 mg via ORAL
  Filled 2013-09-16: qty 1

## 2013-09-16 MED ORDER — METHOCARBAMOL 500 MG PO TABS: 500.0000 mg | ORAL_TABLET | Freq: Four times a day (QID) | ORAL | Status: AC | PRN

## 2013-09-16 MED ORDER — ACETAMINOPHEN 325 MG PO TABS: 650.0000 mg | ORAL_TABLET | Freq: Four times a day (QID) | ORAL | Status: DC | PRN

## 2013-09-16 MED ORDER — CHLORHEXIDINE GLUCONATE 4 % EX LIQD
60.0000 mL | Freq: Once | CUTANEOUS | Status: DC
  Filled 2013-09-16: qty 60

## 2013-09-16 MED ORDER — IRBESARTAN 300 MG PO TABS
300.0000 mg | ORAL_TABLET | Freq: Every day | ORAL | Status: DC
  Administered 2013-09-16 – 2013-09-18 (×2): 300 mg via ORAL
  Filled 2013-09-16 (×4): qty 1

## 2013-09-16 MED ORDER — DEXAMETHASONE 6 MG PO TABS
10.0000 mg | ORAL_TABLET | Freq: Every day | ORAL | Status: AC
  Administered 2013-09-17: 10 mg via ORAL
  Filled 2013-09-16: qty 1

## 2013-09-16 MED ORDER — OXYCODONE HCL 5 MG/5ML PO SOLN: 5.0000 mg | Freq: Once | ORAL | Status: AC | PRN

## 2013-09-16 MED ORDER — SODIUM CHLORIDE 0.9 % IV SOLN: INTRAVENOUS | Status: DC

## 2013-09-16 MED ORDER — PHENOL 1.4 % MT LIQD: 1.0000 | OROMUCOSAL | Status: DC | PRN

## 2013-09-16 MED ORDER — FLEET ENEMA 7-19 GM/118ML RE ENEM: 1.0000 | ENEMA | Freq: Once | RECTAL | Status: AC | PRN

## 2013-09-16 MED ORDER — BUPIVACAINE HCL (PF) 0.25 % IJ SOLN
INTRAMUSCULAR | Status: AC
  Filled 2013-09-16: qty 30

## 2013-09-16 MED ORDER — ARTIFICIAL TEARS OP OINT
TOPICAL_OINTMENT | OPHTHALMIC | Status: DC | PRN
  Administered 2013-09-16: 1 via OPHTHALMIC

## 2013-09-16 MED ORDER — SODIUM CHLORIDE 0.9 % IJ SOLN
INTRAMUSCULAR | Status: DC | PRN
  Administered 2013-09-16: 14:00:00

## 2013-09-16 MED ORDER — DOCUSATE SODIUM 100 MG PO CAPS
100.0000 mg | ORAL_CAPSULE | Freq: Two times a day (BID) | ORAL | Status: DC
  Administered 2013-09-16 – 2013-09-18 (×5): 100 mg via ORAL
  Filled 2013-09-16 (×6): qty 1

## 2013-09-16 MED ORDER — LACTATED RINGERS IV SOLN
INTRAVENOUS | Status: DC | PRN
  Administered 2013-09-16 (×2): via INTRAVENOUS

## 2013-09-16 MED ORDER — NEOSTIGMINE METHYLSULFATE 1 MG/ML IJ SOLN
INTRAMUSCULAR | Status: AC
  Filled 2013-09-16: qty 10

## 2013-09-16 MED ORDER — MIDAZOLAM HCL 2 MG/2ML IJ SOLN
INTRAMUSCULAR | Status: AC
  Filled 2013-09-16: qty 2

## 2013-09-16 MED ORDER — ONDANSETRON HCL 4 MG/2ML IJ SOLN
INTRAMUSCULAR | Status: AC
  Filled 2013-09-16: qty 2

## 2013-09-16 MED ORDER — ATENOLOL 25 MG PO TABS
25.0000 mg | ORAL_TABLET | Freq: Every morning | ORAL | Status: DC
  Administered 2013-09-18: 25 mg via ORAL
  Filled 2013-09-16 (×3): qty 1

## 2013-09-16 MED ORDER — SODIUM CHLORIDE 0.9 % IJ SOLN
INTRAMUSCULAR | Status: AC
  Filled 2013-09-16: qty 9

## 2013-09-16 MED ORDER — FENTANYL CITRATE 0.05 MG/ML IJ SOLN
INTRAMUSCULAR | Status: DC | PRN
  Administered 2013-09-16: 100 ug via INTRAVENOUS
  Administered 2013-09-16 (×3): 50 ug via INTRAVENOUS

## 2013-09-16 MED ORDER — ONDANSETRON HCL 4 MG/2ML IJ SOLN
INTRAMUSCULAR | Status: DC | PRN
Start: 2013-09-16 — End: 2013-09-16
  Administered 2013-09-16: 4 mg via INTRAVENOUS

## 2013-09-16 MED ORDER — OXYCODONE HCL 5 MG PO TABS: 5.0000 mg | ORAL_TABLET | ORAL | Status: DC | PRN

## 2013-09-16 MED ORDER — METHOCARBAMOL 500 MG PO TABS
500.0000 mg | ORAL_TABLET | Freq: Four times a day (QID) | ORAL | Status: DC | PRN
  Administered 2013-09-16 – 2013-09-18 (×7): 500 mg via ORAL
  Filled 2013-09-16 (×7): qty 1

## 2013-09-16 MED ORDER — LACTATED RINGERS IV SOLN
INTRAVENOUS | Status: DC
  Administered 2013-09-16: 50 mL/h via INTRAVENOUS

## 2013-09-16 MED ORDER — PROPOFOL 10 MG/ML IV BOLUS
INTRAVENOUS | Status: DC | PRN
  Administered 2013-09-16: 130 mg via INTRAVENOUS

## 2013-09-16 MED ORDER — LIDOCAINE HCL (CARDIAC) 20 MG/ML IV SOLN
INTRAVENOUS | Status: DC | PRN
  Administered 2013-09-16: 80 mg via INTRAVENOUS

## 2013-09-16 MED ORDER — ROCURONIUM BROMIDE 50 MG/5ML IV SOLN
INTRAVENOUS | Status: AC
  Filled 2013-09-16: qty 1

## 2013-09-16 MED ORDER — NEOSTIGMINE METHYLSULFATE 1 MG/ML IJ SOLN
INTRAMUSCULAR | Status: DC | PRN
  Administered 2013-09-16: 3 mg via INTRAVENOUS

## 2013-09-16 MED ORDER — ASPIRIN EC 325 MG PO TBEC
325.0000 mg | DELAYED_RELEASE_TABLET | Freq: Every day | ORAL | Status: DC
  Administered 2013-09-17 – 2013-09-19 (×3): 325 mg via ORAL
  Filled 2013-09-16 (×4): qty 1

## 2013-09-16 MED ORDER — DEXTROSE-NACL 5-0.9 % IV SOLN
INTRAVENOUS | Status: DC
  Administered 2013-09-17: via INTRAVENOUS

## 2013-09-16 MED ORDER — SODIUM CHLORIDE 0.9 % IR SOLN
Status: DC | PRN
  Administered 2013-09-16: 3000 mL

## 2013-09-16 MED ORDER — TRAMADOL HCL 50 MG PO TABS: 50.0000 mg | ORAL_TABLET | Freq: Four times a day (QID) | ORAL | Status: DC | PRN

## 2013-09-16 MED ORDER — BISACODYL 10 MG RE SUPP: 10.0000 mg | Freq: Every day | RECTAL | Status: DC | PRN

## 2013-09-16 MED ORDER — ONDANSETRON HCL 4 MG/2ML IJ SOLN: 4.0000 mg | Freq: Once | INTRAMUSCULAR | Status: DC | PRN

## 2013-09-16 MED ORDER — TRAMADOL HCL 50 MG PO TABS: 50.0000 mg | ORAL_TABLET | Freq: Four times a day (QID) | ORAL | Status: AC | PRN

## 2013-09-16 MED ORDER — OXYCODONE HCL 5 MG PO TABS
5.0000 mg | ORAL_TABLET | ORAL | Status: DC | PRN
  Administered 2013-09-16 – 2013-09-17 (×4): 10 mg via ORAL
  Administered 2013-09-17: 5 mg via ORAL
  Administered 2013-09-17 – 2013-09-18 (×3): 10 mg via ORAL
  Filled 2013-09-16 (×7): qty 2
  Filled 2013-09-16: qty 1
  Filled 2013-09-16: qty 2

## 2013-09-16 MED ORDER — GLYCOPYRROLATE 0.2 MG/ML IJ SOLN
INTRAMUSCULAR | Status: AC
  Filled 2013-09-16: qty 2

## 2013-09-16 MED ORDER — ONDANSETRON HCL 4 MG PO TABS: 4.0000 mg | ORAL_TABLET | Freq: Four times a day (QID) | ORAL | Status: DC | PRN

## 2013-09-16 MED ORDER — PROPOFOL 10 MG/ML IV BOLUS
INTRAVENOUS | Status: AC
  Filled 2013-09-16: qty 20

## 2013-09-16 MED ORDER — ACETAMINOPHEN 650 MG RE SUPP: 650.0000 mg | Freq: Four times a day (QID) | RECTAL | Status: DC | PRN

## 2013-09-16 MED ORDER — MORPHINE SULFATE 2 MG/ML IJ SOLN
1.0000 mg | INTRAMUSCULAR | Status: DC | PRN
  Administered 2013-09-16 (×2): 2 mg via INTRAVENOUS
  Filled 2013-09-16: qty 1

## 2013-09-16 MED ORDER — GLYCOPYRROLATE 0.2 MG/ML IJ SOLN
INTRAMUSCULAR | Status: DC | PRN
  Administered 2013-09-16: 0.4 mg via INTRAVENOUS
  Administered 2013-09-16 (×2): 0.2 mg via INTRAVENOUS

## 2013-09-16 MED ORDER — ONDANSETRON HCL 4 MG/2ML IJ SOLN: 4.0000 mg | Freq: Four times a day (QID) | INTRAMUSCULAR | Status: DC | PRN

## 2013-09-16 SURGICAL SUPPLY — 69 items
AUG TIB SZ5 5 REV STP WDG STRL (Knees) ×2 IMPLANT
BANDAGE ELASTIC 4 VELCRO ST LF (GAUZE/BANDAGES/DRESSINGS) ×3 IMPLANT
BANDAGE ELASTIC 6 VELCRO ST LF (GAUZE/BANDAGES/DRESSINGS) ×3 IMPLANT
BANDAGE ESMARK 6X9 LF (GAUZE/BANDAGES/DRESSINGS) ×1 IMPLANT
BLADE SAG 18X100X1.27 (BLADE) ×3 IMPLANT
BLADE SAW SGTL 13.0X1.19X90.0M (BLADE) ×3 IMPLANT
BLADE SURG 10 STRL SS (BLADE) ×2 IMPLANT
BNDG CMPR 9X6 STRL LF SNTH (GAUZE/BANDAGES/DRESSINGS) ×1
BNDG ESMARK 6X9 LF (GAUZE/BANDAGES/DRESSINGS) ×3
BONE CEMENT GENTAMICIN (Cement) ×9 IMPLANT
BOWL SMART MIX CTS (DISPOSABLE) ×3 IMPLANT
CEMENT BONE GENTAMICIN 40 (Cement) IMPLANT
CLOTH BEACON ORANGE TIMEOUT ST (SAFETY) ×3 IMPLANT
COVER BACK TABLE 24X17X13 BIG (DRAPES) ×3 IMPLANT
COVER SURGICAL LIGHT HANDLE (MISCELLANEOUS) ×3 IMPLANT
CUFF TOURNIQUET SINGLE 34IN LL (TOURNIQUET CUFF) IMPLANT
CUFF TOURNIQUET SINGLE 44IN (TOURNIQUET CUFF) IMPLANT
DRAPE EXTREMITY T 121X128X90 (DRAPE) ×3 IMPLANT
DRAPE PROXIMA HALF (DRAPES) ×3 IMPLANT
DRAPE U-SHAPE 47X51 STRL (DRAPES) ×3 IMPLANT
DRSG ADAPTIC 3X8 NADH LF (GAUZE/BANDAGES/DRESSINGS) ×3 IMPLANT
DRSG PAD ABDOMINAL 8X10 ST (GAUZE/BANDAGES/DRESSINGS) ×3 IMPLANT
ELECT REM PT RETURN 9FT ADLT (ELECTROSURGICAL) ×3
ELECTRODE REM PT RTRN 9FT ADLT (ELECTROSURGICAL) ×1 IMPLANT
EVACUATOR 1/8 PVC DRAIN (DRAIN) ×3 IMPLANT
FEMUR SIGMA PS SZ 5.0 R (Femur) ×2 IMPLANT
GLOVE BIOGEL M 8.0 STRL (GLOVE) ×3 IMPLANT
GLOVE BIOGEL PI IND STRL 8 (GLOVE) ×1 IMPLANT
GLOVE BIOGEL PI INDICATOR 8 (GLOVE) ×2
GOWN STRL NON-REIN LRG LVL3 (GOWN DISPOSABLE) ×9 IMPLANT
HANDPIECE INTERPULSE COAX TIP (DISPOSABLE) ×3
IMMOBILIZER KNEE 22 (SOFTGOODS) ×2 IMPLANT
KIT BASIN OR (CUSTOM PROCEDURE TRAY) ×3 IMPLANT
KIT ROOM TURNOVER OR (KITS) ×3 IMPLANT
NDL 18GX1X1/2 (RX/OR ONLY) (NEEDLE) IMPLANT
NEEDLE 18GX1X1/2 (RX/OR ONLY) (NEEDLE) ×3 IMPLANT
NEEDLE 22X1 1/2 (OR ONLY) (NEEDLE) ×2 IMPLANT
NS IRRIG 1000ML POUR BTL (IV SOLUTION) ×3 IMPLANT
PACK TOTAL JOINT (CUSTOM PROCEDURE TRAY) ×3 IMPLANT
PAD ARMBOARD 7.5X6 YLW CONV (MISCELLANEOUS) ×6 IMPLANT
PAD CAST 4YDX4 CTTN HI CHSV (CAST SUPPLIES) ×1 IMPLANT
PADDING CAST COTTON 4X4 STRL (CAST SUPPLIES) ×3
PADDING CAST COTTON 6X4 STRL (CAST SUPPLIES) ×4 IMPLANT
PATELLA DOME PFC 41MM (Knees) ×2 IMPLANT
PLATE ROT INSERT 17.5MM SIZE5 (Plate) ×2 IMPLANT
RESTRICTOR CEMENT SZ 5 C-STEM (Cement) ×2 IMPLANT
SET HNDPC FAN SPRY TIP SCT (DISPOSABLE) ×1 IMPLANT
SPONGE GAUZE 4X4 12PLY (GAUZE/BANDAGES/DRESSINGS) ×3 IMPLANT
STAPLER VISISTAT 35W (STAPLE) ×3 IMPLANT
STEM TIBIA PFC 13X30MM (Stem) ×2 IMPLANT
SUCTION FRAZIER TIP 10 FR DISP (SUCTIONS) ×3 IMPLANT
SUT ETHIBOND NAB CT1 #1 30IN (SUTURE) ×3 IMPLANT
SUT MON AB 4-0 PC3 18 (SUTURE) ×2 IMPLANT
SUT PDS AB 1 CT  36 (SUTURE) ×4
SUT PDS AB 1 CT 36 (SUTURE) ×2 IMPLANT
SUT VIC AB 0 CTB1 27 (SUTURE) ×2 IMPLANT
SUT VIC AB 2-0 CTB1 (SUTURE) ×8 IMPLANT
SUT VLOC 180 0 24IN GS25 (SUTURE) ×2 IMPLANT
SYR 20CC LL (SYRINGE) ×2 IMPLANT
SYR 50ML LL SCALE MARK (SYRINGE) ×2 IMPLANT
SYR 5ML LL (SYRINGE) ×3 IMPLANT
TOWEL OR 17X24 6PK STRL BLUE (TOWEL DISPOSABLE) ×3 IMPLANT
TOWEL OR 17X26 10 PK STRL BLUE (TOWEL DISPOSABLE) ×3 IMPLANT
TRAY FOLEY CATH 16FRSI W/METER (SET/KITS/TRAYS/PACK) ×3 IMPLANT
TRAY SLEEVE CEM ML (Knees) ×2 IMPLANT
TRAY TIB SZ 5 REVISION (Knees) ×2 IMPLANT
TUBE ANAEROBIC SPECIMEN COL (MISCELLANEOUS) ×3 IMPLANT
WATER STERILE IRR 1000ML POUR (IV SOLUTION) ×3 IMPLANT
WEDGE SZ 5 5MM (Knees) ×4 IMPLANT

## 2013-09-16 NOTE — Brief Op Note (Signed)
09/16/2013  2:05 PM  PATIENT:  Roger Cameron  80 y.o. male  PRE-OPERATIVE DIAGNOSIS:  right knee resection arthroplasty  POST-OPERATIVE DIAGNOSIS:  right knee resection arthroplasty  PROCEDURE:  Procedure(s): REIMPLANTATION OF RIGHT TOTAL KNEE (Right)  SURGEON:  Surgeon(s) and Role:    * [name], MD - Primary  PHYSICIAN ASSISTANT:   ASSISTANTS: Arlee Muslim, PA-C   ANESTHESIA:   general  EBL:  Total I/O In: 1000 [I.V.:1000] Out: 450 [Urine:400; Blood:50]  BLOOD ADMINISTERED:none  DRAINS: (Medium) Hemovact drain(s) in the right knee with  Suction Open   LOCAL MEDICATIONS USED:  OTHER Exparel  SPECIMEN:  No Specimen  DISPOSITION OF SPECIMEN:  N/A  COUNTS:  YES  TOURNIQUET:   Total Tourniquet Time Documented: Thigh (Right) - 71 minutes Total: Thigh (Right) - 71 minutes   DICTATION: .Other Dictation: Dictation Number [id number]  PLAN OF CARE: Admit to inpatient   PATIENT DISPOSITION:  PACU - hemodynamically stable.

## 2013-09-16 NOTE — Progress Notes (Signed)
Pt unable to tol. CPM at 10-40 degrees. Flexion decreased to 30 degrees and pt says this is better for now.

## 2013-09-16 NOTE — Progress Notes (Signed)
Utilization review completed.  

## 2013-09-16 NOTE — Progress Notes (Signed)
Orthopedic Tech Progress Note Patient Details:  Roger Cameron 08/16/1928 [id number]  CPM Right Knee CPM Right Knee: On Right Knee Flexion (Degrees): 40 Right Knee Extension (Degrees): 10 Additional Comments: Trapeze bar   Cammer, Theodoro Parma 09/16/2013, 4:11 PM

## 2013-09-16 NOTE — Anesthesia Preprocedure Evaluation (Addendum)
Anesthesia Evaluation  Patient identified by MRN, date of birth, ID band Patient awake    Reviewed: Allergy & Precautions, H&P , NPO status , Patient's Chart, lab work & pertinent test results  Airway Mallampati: II  Neck ROM: full    Dental   Pulmonary neg pulmonary ROS,          Cardiovascular hypertension,     Neuro/Psych Anxiety    GI/Hepatic GERD-  ,  Endo/Other    Renal/GU      Musculoskeletal  (+) Arthritis -,   Abdominal   Peds  Hematology   Anesthesia Other Findings   Reproductive/Obstetrics                          Anesthesia Physical Anesthesia Plan  ASA: II  Anesthesia Plan: General   Post-op Pain Management:    Induction: Intravenous  Airway Management Planned: Oral ETT  Additional Equipment:   Intra-op Plan:   Post-operative Plan: Extubation in OR  Informed Consent: I have reviewed the patients History and Physical, chart, labs and discussed the procedure including the risks, benefits and alternatives for the proposed anesthesia with the patient or authorized representative who has indicated his/her understanding and acceptance.     Plan Discussed with: CRNA, Anesthesiologist and Surgeon  Anesthesia Plan Comments:        Anesthesia Quick Evaluation

## 2013-09-16 NOTE — Transfer of Care (Signed)
Immediate Anesthesia Transfer of Care Note  Patient: Roger Cameron  Procedure(s) Performed: Procedure(s): REIMPLANTATION OF RIGHT TOTAL KNEE (Right)  Patient Location: PACU  Anesthesia Type:General  Level of Consciousness: awake, alert  and oriented  Airway & Oxygen Therapy: Patient Spontanous Breathing and Patient connected to face mask oxygen  Post-op Assessment: Report given to PACU RN  Post vital signs: Reviewed and stable  Complications: No apparent anesthesia complications

## 2013-09-16 NOTE — Interval H&P Note (Signed)
History and Physical Interval Note:  09/16/2013 12:11 PM  Roger Cameron  has presented today for surgery, with the diagnosis of right knee resection arthroplasty  The various methods of treatment have been discussed with the patient and family. After consideration of risks, benefits and other options for treatment, the patient has consented to  Procedure(s): REIMPLANTATION OF RIGHT TOTAL KNEE (Right) as a surgical intervention .  The patient's history has been reviewed, patient examined, no change in status, stable for surgery.  I have reviewed the patient's chart and labs.  Questions were answered to the patient's satisfaction.     Roger Cameron

## 2013-09-16 NOTE — Preoperative (Signed)
Beta Blockers   Reason not to administer Beta Blockers:Not Applicable 

## 2013-09-17 ENCOUNTER — Encounter (HOSPITAL_COMMUNITY): Payer: Self-pay | Admitting: Orthopedic Surgery

## 2013-09-17 LAB — BASIC METABOLIC PANEL
BUN: 15 mg/dL (ref 6–23)
CO2: 22 mEq/L (ref 19–32)
Calcium: 8.5 mg/dL (ref 8.4–10.5)
Chloride: 100 mEq/L (ref 96–112)
Creatinine, Ser: 0.74 mg/dL (ref 0.50–1.35)
GFR, EST NON AFRICAN AMERICAN: 84 mL/min — AB (ref >90)
Glucose, Bld: 121 mg/dL — ABNORMAL HIGH (ref 70–99)
POTASSIUM: 4.4 meq/L (ref 3.7–5.3)
SODIUM: 134 meq/L — AB (ref 137–147)

## 2013-09-17 LAB — CBC
HCT: 30.5 % — ABNORMAL LOW (ref 39.0–52.0)
Hemoglobin: 10.6 g/dL — ABNORMAL LOW (ref 13.0–17.0)
MCH: 29.9 pg (ref 26.0–34.0)
MCHC: 34.8 g/dL (ref 30.0–36.0)
MCV: 85.9 fL (ref 78.0–100.0)
Platelets: 186 10*3/uL (ref 150–400)
RBC: 3.55 MIL/uL — ABNORMAL LOW (ref 4.22–5.81)
RDW: 13.5 % (ref 11.5–15.5)
WBC: 8.5 10*3/uL (ref 4.0–10.5)

## 2013-09-17 MED ORDER — CEFAZOLIN SODIUM-DEXTROSE 2-3 GM-% IV SOLR
2.0000 g | Freq: Once | INTRAVENOUS | Status: AC
  Administered 2013-09-17: 2 g via INTRAVENOUS
  Filled 2013-09-17: qty 50

## 2013-09-17 NOTE — Progress Notes (Signed)
Orthopedic Tech Progress Note Patient Details:  RAVINDRA BARANEK Jun 26, 1929 [id number] Checked for 1500 Ortho Tech CPM rounds. Patient stated he had just come out of CPM.  CPM Right Knee CPM Right Knee: Off Right Knee Flexion (Degrees): 30 Right Knee Extension (Degrees): 10 Additional Comments: Trapeze bar   [name] 09/17/2013, 3:32 PM

## 2013-09-17 NOTE — Evaluation (Signed)
Physical Therapy Evaluation Patient Details Name: Roger Cameron MRN: [id number] DOB: 1929/07/14 Today's Date: 09/17/2013 Time: 4098-1191 PT Time Calculation (min): 22 min  PT Assessment / Plan / Recommendation History of Present Illness  Pt is an 80 y/o male with complex history with the R knee and multiple surgeries. Pt is now s/p reimplantation of TKA and is WBAT.   Clinical Impression  This patient presents with acute pain and decreased functional independence following the above mentioned procedure. At the time of PT eval, pt was very motivated to get up and ambulate. Feel he will progress well at home. This patient is appropriate for skilled PT interventions to address functional limitations, improve safety and independence with functional mobility, and return to PLOF.    PT Assessment  Patient needs continued PT services    Follow Up Recommendations  Home health PT    Does the patient have the potential to tolerate intense rehabilitation      Barriers to Discharge   Pt lives alone but has a good system worked out with family and neighbors that assist him at home when needed.     Equipment Recommendations  None recommended by PT    Recommendations for Other Services     Frequency 7X/week    Precautions / Restrictions Precautions Precautions: Fall;Knee Precaution Comments: Discussed towel roll under heel and NO pillow under knee  Required Braces or Orthoses:  (Pt with knee immobilizer in room, per orders, can D/C now. ) Restrictions Weight Bearing Restrictions: Yes RLE Weight Bearing: Weight bearing as tolerated   Pertinent Vitals/Pain Pt reports minimal pain throughout session       Mobility  Bed Mobility Overal bed mobility: Needs Assistance Bed Mobility: Supine to Sit Supine to sit: Min assist General bed mobility comments: VC's for sequencing and technique. Pt able to get to EOB with min assist for LE movement and trunk support at times.  Transfers Overall  transfer level: Needs assistance Equipment used: Rolling walker (2 wheeled) Transfers: Sit to/from Stand Sit to Stand: Min guard General transfer comment: VC's for hand placement on seated surface for safety.  Ambulation/Gait Ambulation/Gait assistance: Min guard Ambulation Distance (Feet): 50 Feet Assistive device: Rolling walker (2 wheeled) Gait Pattern/deviations: Step-to pattern;Step-through pattern;Decreased stride length Gait velocity: Decreased Gait velocity interpretation: Below normal speed for age/gender General Gait Details: VC's for increased heel strike, and encouraged step-through gait pattern.     Exercises Total Joint Exercises Ankle Circles/Pumps: 10 reps Quad Sets: 10 reps   PT Diagnosis: Difficulty walking;Acute pain  PT Problem List: Decreased strength;Decreased range of motion;Decreased activity tolerance;Decreased balance;Decreased mobility;Decreased knowledge of use of DME;Decreased safety awareness;Decreased knowledge of precautions;Pain PT Treatment Interventions: DME instruction;Gait training;Stair training;Functional mobility training;Therapeutic activities;Therapeutic exercise;Neuromuscular re-education;Patient/family education     PT Goals(Current goals can be found in the care plan section) Acute Rehab PT Goals Patient Stated Goal: To return to PLOF before knee surgeries PT Goal Formulation: With patient/family Time For Goal Achievement: 09/24/13 Potential to Achieve Goals: Good  Visit Information  Last PT Received On: 09/17/13 Assistance Needed: +1 History of Present Illness: Pt is an 80 y/o male with complex history with the R knee and multiple surgeries. Pt is now s/p reimplantation of TKA and is WBAT.        [place] expects to be discharged to:: Private residence Living Arrangements: Alone Available Help at Discharge: Family;Neighbor Type of Home: House Home Access: Level entry Home Layout: One  level Home Equipment: Environmental consultant -  2 wheels;Wheelchair - Brewing technologist;Toilet riser Prior Function Level of Independence: Independent with assistive device(s) Communication Communication: No difficulties Dominant Hand: Right    Cognition  Cognition Arousal/Alertness: Awake/alert Behavior During Therapy: WFL for tasks assessed/performed Overall Cognitive Status: Within Functional Limits for tasks assessed    Extremity/Trunk Assessment Upper Extremity Assessment Upper Extremity Assessment: Defer to OT evaluation Lower Extremity Assessment Lower Extremity Assessment: RLE deficits/detail RLE Deficits / Details: Decreased strength and AROM consistent with TKA Cervical / Trunk Assessment Cervical / Trunk Assessment: Normal   Balance Balance Overall balance assessment: Needs assistance Sitting-balance support: Feet supported;Bilateral upper extremity supported Sitting balance-Leahy Scale: Fair Standing balance support: Bilateral upper extremity supported Standing balance-Leahy Scale: Fair  End of Session PT - End of Session Equipment Utilized During Treatment: Gait belt;Right knee immobilizer Activity Tolerance: Patient tolerated treatment well Patient left: in chair;with call bell/phone within reach;with family/visitor present Nurse Communication: Mobility status  GP     Jolyn Lent 09/17/2013, 12:45 PM  Jolyn Lent, [place], DPT [phone]

## 2013-09-17 NOTE — Progress Notes (Signed)
   Subjective: 1 Day Post-Op Procedure(s) (LRB): REIMPLANTATION OF RIGHT TOTAL KNEE (Right) Patient reports pain as mild.   We will start therapy today.  Plan is to go Home after hospital stay.  Objective: Vital signs in last 24 hours: Temp:  [96.2 F (35.7 C)-98.8 F (37.1 C)] 97.6 F (36.4 C) (02/28 0457) Pulse Rate:  [42-67] 59 (02/28 0457) Resp:  [16-21] 16 (02/28 0457) BP: (92-156)/(39-76) 92/39 mmHg (02/28 0457) SpO2:  [97 %-100 %] 98 % (02/28 0457) Weight:  [177 lb (80.287 kg)] 177 lb (80.287 kg) (02/28 0100)  Intake/Output from previous day:  Intake/Output Summary (Last 24 hours) at 09/17/13 0841 Last data filed at 09/17/13 0641  Gross per 24 hour  Intake 3401.67 ml  Output   1650 ml  Net 1751.67 ml    Intake/Output this shift:    Labs:  Recent Labs  09/17/13 0428  HGB 10.6*    Recent Labs  09/17/13 0428  WBC 8.5  RBC 3.55*  HCT 30.5*  PLT 186    Recent Labs  09/17/13 0428  NA 134*  K 4.4  CL 100  CO2 22  BUN 15  CREATININE 0.74  GLUCOSE 121*  CALCIUM 8.5   No results found for this basename: LABPT, INR,  in the last 72 hours  EXAM General - Patient is Alert, Appropriate and Oriented Extremity - Neurologically intact Neurovascular intact No cellulitis present Compartment soft Dressing - dressing C/D/I Motor Function - intact, moving foot and toes well on exam.  Hemovac pulled without difficulty.  Past Medical History  Diagnosis Date  . Impaired hearing     NO HEARING AIDS  . Anxiety   . Glaucoma     BILATERAL  . Cataract     LEFT EYE  . Hypertension   . GERD (gastroesophageal reflux disease)   . Arthritis   . Infection 07/15/13    RIGHT KNEE INFECTION  S/P RT UNIKNEE REPLACEMENT ON 01/03/13 - STATES HIS KNEE IS VERY SWOLLEN   . Anemia     was put on iron @ [facility]    Assessment/Plan: 1 Day Post-Op Procedure(s) (LRB): REIMPLANTATION OF RIGHT TOTAL KNEE (Right) Active Problems:   OA (osteoarthritis) of  knee   Advance diet Up with therapy D/C IV fluids Discharge home with home health anticipated date 3/2  DVT Prophylaxis - Aspirin Weight Bearing As Tolerated right Leg Hemovac Pulled Begin Therapy   [name]

## 2013-09-17 NOTE — Op Note (Signed)
NAME:  Roger Cameron, Roger Cameron NO.:  [id number]  MEDICAL RECORD NO.:  [id number]  LOCATION:  [id number]                        FACILITY:  [facility]  PHYSICIAN:  Gaynelle Arabian, M.D.    DATE OF BIRTH:  1928-08-29  DATE OF PROCEDURE:  09/16/2013 DATE OF DISCHARGE:                              OPERATIVE REPORT   PREOPERATIVE DIAGNOSIS:  Right knee resection arthroplasty.  POSTOPERATIVE DIAGNOSIS:  Right knee resection arthroplasty.  PROCEDURE:  Right total knee arthroplasty reimplantation.  SURGEON:  Gaynelle Arabian, M.D.  ASSISTANT:  Alexzandrew L. Perkins, PA-C  ANESTHESIA:  General.  ESTIMATED BLOOD LOSS:  Minimal.  DRAINS:  Hemovac x1.  TOURNIQUET TIME:  71 minutes at 300 mmHg.  COMPLICATIONS:  None.  CONDITION:  Stable to recovery.  BRIEF CLINICAL NOTE:  Roger Cameron is an 80 year old male who has a complex history in regard to his right knee.  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  He did not respond to irrigation and debridement and eventually had to have a resection arthroplasty and placement of an antibiotic spacer.  He has had 6 weeks of intravenous antibiotics and presents now for total knee arthroplasty reimplantation.  PROCEDURE IN DETAIL:  After successful administration of general anesthetic, a tourniquet was placed high on the right thigh, right lower extremity prepped and draped in the usual sterile fashion.  Extremities wrapped in Esmarch, tourniquet inflated to 300 mmHg.  A midline incision was made with a 10 blade through the subcutaneous tissue to the level of the extensor mechanism.  Fresh blade was used to make a medial parapatellar arthrotomy.  There was essentially no fluid in the joint. Any fluid present was then sent for a stat Gram stain, which showed a rare white cells and no organisms.  He had a significant amount of scarring under his extensor mechanism and the  scar was all debrided back to normal-appearing tissue both medially and laterally.  Medial and lateral femoral gutters were re-created.  Soft tissue of the proximal medial tibia subperiosteally elevated to the joint line with a knife and into the semimembranosus bursa with a Cobb elevator.  In order to evert the patella, I had to do quadriceps snip and had this performed. Patella was then everted.  Knee was flexed gently and the antibiotic spacer removed.  We then flexed the knee to 90 degrees and I removed the scar carefully from the back of the knee.  The tibia was first addressed.  Circumferential retractors were placed, and the tibia subluxed forward.  The extramedullary tibial alignment guide was placed referencing proximally at the medial aspect of the tibial tubercle and distally along the second metatarsal axis and tibial crest.  Blocks pinned to remove about 4 mm off the proximal tibia which will get down to normal bone.  Resection was made with an oscillating saw.  Given this was a previous unicompartmental replacement, I decided and wanted to stem the tibial component.  We prepared for 13 x 30 stem.  The size 5 was the most appropriate tibia and the template for the size 5 was placed in and proximally we drilled with a modular drill and  then also prepared for 29 mm sleeve.  We then placed the trial which was a size 5 meq revision tray with a 29 sleeve, 13 x 30 stem extension, and impacted that heel into the tibia.  Femur was then addressed.  I made the cuts for the femur when I did the resection arthroplasty.  We entered the femoral canal and thoroughly irrigated with saline.  The femoral alignment guide was placed 5 degrees valgus.  I freshened up the distal femur cup by removing 2 mm.  Size 5 was the most appropriate femur size and we placed the cutting block in external rotation measuring off the epicondylar axis.  We freshened up the anterior, posterior and chamfer cuts.   The intercondylar block was placed and the intercondylar cut then made.  The size 5 posterior stabilized femoral trial was placed.  We got up to a 20 mm insert, that is why I decided to place 5 mm augments medial and lateral on the tibia to build the joint line back up to its normal base.  With a 17, 5 full extension was achieved with excellent varus, valgus and anterior- posterior balance throughout full range of motion.  Patella was everted again and I resected down to a thickness of 14 mm.  The 41 template was placed, lug holes were drilled, trial patella was placed and it tracks normally.  The trials were all removed, and then I trialed the cement restrictor and tibia which was a size 5.  It was placed in the appropriate depth in the tibial canal.  The joint was thoroughly irrigated with pulsatile lavage.  The cement was mixed which was 3 batches of gentamicin impregnated cement.  Once ready for implantation, it was injected into the tibial canal and placed on the cut tibial surface and the component was impacted with all extruded cement removed. On the femoral side, it was cemented and impacted and placed the trial insert, held the knee in full extension.  All extruded cement was removed.  Forty-one patella cemented in place, held with a clamp with extruded cement removed.  When the cement fully hardened, then the permanent 17.5 mm posterior stabilized rotating platform insert was placed in the tibial tray.  The wound was copiously irrigated with saline solution.  The quadriceps snip was closed with interrupted #1 PDS suture.  The extensor mechanism was closed with running #1 V-Loc suture. Flexion against gravity was at least 90 degrees and the patella tracks normally.  A second limb of the Hemovac drain was placed in the subcu tissues.  The tourniquet was released total time of 71 minutes.  Subcu was closed with interrupted 2-0 Vicryl and skin with staples.  Incision was cleaned and  dried and a bulky sterile dressing applied.  He was then placed into a knee immobilizer, awakened and transported to recovery in stable condition.  Note that a surgical assistant was a medical necessity for this procedure in order to do it in a safe and expeditious manner.  Surgical assistance necessary for retraction of the ligaments and vital neurovascular structures and for proper positioning of the limb for proper placement of this prosthesis.  Assistance also necessary to assist with closure.     Gaynelle Arabian, M.D.     FA/MEDQ  D:  09/16/2013  T:  09/16/2013  Job:  [id number]

## 2013-09-17 NOTE — Progress Notes (Signed)
PT Cancellation Note  Patient Details Name: Roger Cameron MRN: [id number] DOB: 21-Oct-1928   Cancelled Treatment:    Reason Eval/Treat Not Completed: Patient declined, no reason specified. Patient was in CPM and asked to stay in CPM at this time. Will follow up in AM   Jakorey Mcconathy, Tonia Brooms 09/17/2013, 2:19 PM

## 2013-09-18 LAB — CBC
HCT: 28.7 % — ABNORMAL LOW (ref 39.0–52.0)
HEMOGLOBIN: 10 g/dL — AB (ref 13.0–17.0)
MCH: 29.8 pg (ref 26.0–34.0)
MCHC: 34.8 g/dL (ref 30.0–36.0)
MCV: 85.4 fL (ref 78.0–100.0)
Platelets: 187 10*3/uL (ref 150–400)
RBC: 3.36 MIL/uL — AB (ref 4.22–5.81)
RDW: 13.5 % (ref 11.5–15.5)
WBC: 10.3 10*3/uL (ref 4.0–10.5)

## 2013-09-18 LAB — BASIC METABOLIC PANEL
BUN: 12 mg/dL (ref 6–23)
CO2: 24 meq/L (ref 19–32)
Calcium: 9 mg/dL (ref 8.4–10.5)
Chloride: 102 mEq/L (ref 96–112)
Creatinine, Ser: 0.65 mg/dL (ref 0.50–1.35)
GFR calc Af Amer: >90 mL/min (ref >90)
GFR, EST NON AFRICAN AMERICAN: 88 mL/min — AB (ref >90)
GLUCOSE: 140 mg/dL — AB (ref 70–99)
POTASSIUM: 4.4 meq/L (ref 3.7–5.3)
SODIUM: 140 meq/L (ref 137–147)

## 2013-09-18 NOTE — Progress Notes (Signed)
Subjective: 2 Days Post-Op Procedure(s) (LRB): REIMPLANTATION OF RIGHT TOTAL KNEE (Right) Patient reports pain as mild.  Reports pain is well controlled. No CP, SOB, F/C, N/V. Voiding without difficulty. No other c/o. Hoping to go home tomorrow.  Objective: Vital signs in last 24 hours: Temp:  [97.6 F (36.4 C)-98.3 F (36.8 C)] 97.6 F (36.4 C) (03/01 0513) Pulse Rate:  [65-78] 65 (03/01 0513) Resp:  [17-20] 19 (03/01 0513) BP: (110-130)/(49-62) 110/60 mmHg (03/01 0513) SpO2:  [98 %-100 %] 98 % (03/01 0513)  Intake/Output from previous day: 02/28 0701 - 03/01 0700 In: 1040 [P.O.:1040] Out: 1250 [Urine:1250] Intake/Output this shift:     Recent Labs  09/17/13 0428 09/18/13 0506  HGB 10.6* 10.0*    Recent Labs  09/17/13 0428 09/18/13 0506  WBC 8.5 10.3  RBC 3.55* 3.36*  HCT 30.5* 28.7*  PLT 186 187    Recent Labs  09/17/13 0428 09/18/13 0506  NA 134* 140  K 4.4 4.4  CL 100 102  CO2 22 24  BUN 15 12  CREATININE 0.74 0.65  GLUCOSE 121* 140*  CALCIUM 8.5 9.0   No results found for this basename: LABPT, INR,  in the last 72 hours  Neurologically intact ABD soft Neurovascular intact Sensation intact distally Intact pulses distally Dorsiflexion/Plantar flexion intact Incision: moderate drainage No cellulitis present Compartment soft no calf pain or sign of DVT Moderate amt dried bloody drainage on dressing, new dressing 4x4s and ABD placed, wrapped with ACE  Assessment/Plan: 2 Days Post-Op Procedure(s) (LRB): REIMPLANTATION OF RIGHT TOTAL KNEE (Right) Advance diet Up with therapy D/C IV fluids Plan for discharge tomorrow home with HHPT  BISSELL, JACLYN M. 09/18/2013, 8:53 AM

## 2013-09-18 NOTE — Progress Notes (Signed)
Physical Therapy Treatment Patient Details Name: Roger Cameron MRN: [id number] DOB: 01-29-1929 Today's Date: 09/18/2013 Time: 8299-3716 PT Time Calculation (min): 23 min  PT Assessment / Plan / Recommendation  History of Present Illness Pt is an 80 y/o male with complex history with the R knee and multiple surgeries. Pt is now s/p reimplantation of TKA and is WBAT.    PT Comments   Pt making great progress with mobility today.  Follow Up Recommendations  Home health PT     Equipment Recommendations  None recommended by PT       Frequency 7X/week   Progress towards PT Goals Progress towards PT goals: Progressing toward goals  Plan Current plan remains appropriate    Precautions / Restrictions Precautions Precautions: Fall;Knee Precaution Booklet Issued: No Precaution Comments: educated on no pillow under knee. Required Braces or Orthoses:  (can d/c knee immobilizer now) Restrictions Weight Bearing Restrictions: Yes RLE Weight Bearing: Weight bearing as tolerated       Mobility  Bed Mobility Overal bed mobility: Needs Assistance Bed Mobility: Supine to Sit Supine to sit: Min assist General bed mobility comments: pt in recliner before and after session Transfers Overall transfer level: Needs assistance Equipment used: Rolling walker (2 wheeled) Transfers: Sit to/from Stand Sit to Stand: Min guard General transfer comment: cues on hand placement with transfers Ambulation/Gait Ambulation/Gait assistance: Supervision Ambulation Distance (Feet): 200 Feet Assistive device: Rolling walker (2 wheeled) Gait Pattern/deviations: Step-through pattern;Decreased stride length;Decreased stance time - right;Decreased step length - left;Antalgic Gait velocity: Decreased Gait velocity interpretation: Below normal speed for age/gender General Gait Details: cues on posture, walker position/use with gait and on incr heel strike and weight bearing on right leg    Exercises Total Joint  Exercises Ankle Circles/Pumps: AROM;Both;10 reps;Seated Quad Sets: AROM;Strengthening;Both;10 reps;Seated Heel Slides: AAROM;Strengthening;Right;10 reps;Seated Straight Leg Raises: AAROM;Strengthening;Right;10 reps;Seated     PT Goals (current goals can now be found in the care plan section) Acute Rehab PT Goals Patient Stated Goal: not stated PT Goal Formulation: With patient/family Time For Goal Achievement: 09/24/13 Potential to Achieve Goals: Good  Visit Information  Last PT Received On: 09/18/13 Assistance Needed: +1 History of Present Illness: Pt is an 80 y/o male with complex history with the R knee and multiple surgeries. Pt is now s/p reimplantation of TKA and is WBAT.     Subjective Data  Patient Stated Goal: not stated   Cognition  Cognition Arousal/Alertness: Awake/alert Behavior During Therapy: WFL for tasks assessed/performed Overall Cognitive Status: Within Functional Limits for tasks assessed       End of Session PT - End of Session Equipment Utilized During Treatment: Gait belt Activity Tolerance: Patient tolerated treatment well Patient left: in chair;with family/visitor present;with call bell/phone within reach Nurse Communication: Mobility status CPM Right Knee CPM Right Knee: Off Right Knee Flexion (Degrees): 32   GP     [name] 09/18/2013, 2:08 PM  [name], PTA Office- [phone]

## 2013-09-18 NOTE — Evaluation (Signed)
Occupational Therapy Evaluation Patient Details Name: Roger Cameron MRN: [id number] DOB: Feb 13, 1929 Today's Date: 09/18/2013 Time: 0321-2248 OT Time Calculation (min): 22 min  OT Assessment / Plan / Recommendation History of present illness Pt is an 80 y/o male with complex history with the R knee and multiple surgeries. Pt is now s/p reimplantation of TKA and is WBAT.    Clinical Impression   Pt presents with below problem list. Education provided during session. Pt independent with ADLs, PTA. Feel pt will benefit from acute OT to increase independence prior to d/c.  OT Assessment  Patient needs continued OT Services    Follow Up Recommendations  Home health OT;Supervision - Intermittent    Barriers to Discharge Decreased caregiver support    Equipment Recommendations  None recommended by OT    Recommendations for Other Services    Frequency  Min 2X/week    Precautions / Restrictions Precautions Precautions: Fall;Knee Precaution Booklet Issued: No Precaution Comments: Reviewed precautions with pt Required Braces or Orthoses:  (can d/c knee immobilizer now) Restrictions Weight Bearing Restrictions: Yes RLE Weight Bearing: Weight bearing as tolerated   Pertinent Vitals/Pain Pain 3-4/10. Repositioned.     ADL  Upper Body Dressing: Set up Where Assessed - Upper Body Dressing: Supported sitting Lower Body Dressing: Min guard Where Assessed - Lower Body Dressing: Supported sit to stand Toilet Transfer: Magazine features editor Method: Sit to Loss adjuster, chartered: Raised toilet seat with arms (or 3-in-1 over toilet) Tub/Shower Transfer Method: Not assessed Equipment Used: Gait belt;Rolling walker Transfers/Ambulation Related to ADLs: Min guard for ambulation and Min A/Min guard for transfers. ADL Comments: Discussed use of bag on walker to carry items. Recommended sitting for dressing and to stand in front of bed/chair with walker in front when pulling up LB  clothing. Edcuated on dressing techique and explained benefit of reaching down to don/doff right sock as it increases ROM in knee. Discussed how to perform shower transfer with walker-pt planning to initially sponge bathe for a while, but was thankful for education.    OT Diagnosis: Acute pain  OT Problem List: Decreased strength;Decreased activity tolerance;Impaired balance (sitting and/or standing);Decreased knowledge of use of DME or AE;Decreased knowledge of precautions;Decreased range of motion;Pain OT Treatment Interventions: Self-care/ADL training;DME and/or AE instruction;Therapeutic activities;Balance training;Patient/family education   OT Goals(Current goals can be found in the care plan section) Acute Rehab OT Goals Patient Stated Goal: not stated OT Goal Formulation: With patient Time For Goal Achievement: 09/25/13 Potential to Achieve Goals: Good ADL Goals Pt Will Perform Lower Body Dressing: with modified independence;sit to/from stand Pt Will Transfer to Toilet: with modified independence;ambulating (3 in 1 over commode) Pt Will Perform Toileting - Clothing Manipulation and hygiene: with modified independence;sit to/from stand Additional ADL Goal #1: Pt will perform bed mobility at Mod I level as precursor for ADLs.   Visit Information  Last OT Received On: 09/18/13 Assistance Needed: +1 History of Present Illness: Pt is an 80 y/o male with complex history with the R knee and multiple surgeries. Pt is now s/p reimplantation of TKA and is WBAT.        Prior [place] expects to be discharged to:: Private residence Living Arrangements: Alone Available Help at Discharge: Family;Neighbor Type of Home: House Home Access: Level entry Home Layout: One [place]: [place] - 2 wheels;Wheelchair - manual;Shower seat;Bedside commode Prior Function Level of Independence: Independent with assistive  device(s) Communication Communication: No difficulties Dominant Hand:  Right         Vision/Perception     Cognition  Cognition Arousal/Alertness: Awake/alert Behavior During Therapy: WFL for tasks assessed/performed Overall Cognitive Status: Within Functional Limits for tasks assessed    Extremity/Trunk Assessment Upper Extremity Assessment Upper Extremity Assessment: Overall WFL for tasks assessed Lower Extremity Assessment Lower Extremity Assessment: Defer to PT evaluation     Mobility Bed Mobility Overal bed mobility: Needs Assistance Bed Mobility: Supine to Sit Supine to sit: Min assist General bed mobility comments: Min A for RLE. Educated that he could use sheet to assist LE if needed or could hook other leg around ankle. Transfers Overall transfer level: Needs assistance Equipment used: Rolling walker (2 wheeled) Transfers: Sit to/from Stand Sit to Stand: Min guard;Min assist General transfer comment: cues for technique.     Exercise     Balance     End of Session OT - End of Session Equipment Utilized During Treatment: Gait belt;Rolling walker Activity Tolerance: Patient tolerated treatment well Patient left: in chair;with call bell/phone within reach;with family/visitor present Nurse Communication: Other (comment) (saw him walking in hall) CPM Right Knee CPM Right Knee: Off  GO     Benito Mccreedy OTR/L [phone]  09/18/2013, 11:44 AM

## 2013-09-18 NOTE — Progress Notes (Addendum)
   CARE MANAGEMENT NOTE 09/18/2013  Patient:  Roger Cameron, Roger Cameron   Account Number:  [id number]  Date Initiated:  09/18/2013  Documentation initiated by:  [facility]  Subjective/Objective Assessment:   adm: for REIMPLANTATION OF RIGHT TOTAL KNEE (Right)     Action/Plan:   discharge planning   Anticipated DC Date:  09/19/2013   Anticipated DC Plan:  [place]  CM consult      [facility] Choice  HOME HEALTH   Choice offered to / List presented to:  [name]        [place] arranged  [place]   Status of service:  Completed, signed off Medicare Important Message given?   (If response is "NO", the following Medicare IM given date fields will be blank) Date Medicare IM given:   Date Additional Medicare IM given:    Discharge Disposition:  [place]  Per UR Regulation:    If discussed at Long Length of Stay Meetings, dates discussed:    Comments:  09/18/13 11:55 CM spoke with pt in room to offer choice for [facility] services.  Pt states he really liked [place] and wishes to have them again.  Pt states he does not need any DME as he has all he needs from previous kne surgeries.  CM called Roger Cameron with [facility] and made request for HHPT/OT.  Facesheet, Face to Face, OP note, PT/OT evals, H&P all faxed to [facility] per [place]. Request.  Address and contact numbers verified and added neighbor, Roger Cameron [phone] as his seondary contact as he states he is hard of hearing and his neighbor watches out for him. Roger Cameron will also pick up pt from hospital tomorrow 09/19/13 for discharge.  No other CM needs were communicated. Roger Cameron, BSN, CM [phone].

## 2013-09-18 NOTE — Progress Notes (Signed)
Orthopedic Tech Progress Note Patient Details:  Roger Cameron 03-12-1929 [id number] CPM applied to Left LE for 1500 CPM rounds. Application tolerated well.  CPM Right Knee CPM Right Knee: On Right Knee Flexion (Degrees): 32 Right Knee Extension (Degrees): 10 Additional Comments: Trapeze bar   [name] 09/18/2013, 2:52 PM

## 2013-09-18 NOTE — Progress Notes (Signed)
PT Cancellation Note  Patient Details Name: Roger Cameron MRN: [id number] DOB: 24-May-1929   Cancelled Treatment:    Reason Eval/Treat Not Completed: Other (comment): pt reports just got back to bed in cpm and wishes to stay in that for awhile. Agreeable to hall ambulation with nursing/family after cpm removed for dinner.   [name] 09/18/2013, 4:01 PM  [name], PTA Office- [phone]

## 2013-09-19 ENCOUNTER — Encounter (HOSPITAL_COMMUNITY): Payer: Self-pay | Admitting: Orthopedic Surgery

## 2013-09-19 LAB — CBC
HCT: 29.1 % — ABNORMAL LOW (ref 39.0–52.0)
Hemoglobin: 9.9 g/dL — ABNORMAL LOW (ref 13.0–17.0)
MCH: 29 pg (ref 26.0–34.0)
MCHC: 34 g/dL (ref 30.0–36.0)
MCV: 85.3 fL (ref 78.0–100.0)
Platelets: 219 10*3/uL (ref 150–400)
RBC: 3.41 MIL/uL — ABNORMAL LOW (ref 4.22–5.81)
RDW: 13.6 % (ref 11.5–15.5)
WBC: 8.9 10*3/uL (ref 4.0–10.5)

## 2013-09-19 NOTE — Discharge Summary (Signed)
Physician Discharge Summary   Patient ID: Roger Cameron MRN: [id number] DOB/AGE: Feb 01, 1929 80 y.o.  Admit date: 09/16/2013 Discharge date: 09-19-2013  Primary Diagnosis:  Right knee resection arthroplasty.  Admission Diagnoses:  Past Medical History  Diagnosis Date  . Impaired hearing     NO HEARING AIDS  . Anxiety   . Glaucoma     BILATERAL  . Cataract     LEFT EYE  . Hypertension   . GERD (gastroesophageal reflux disease)   . Arthritis   . Infection 07/15/13    RIGHT KNEE INFECTION  S/P RT UNIKNEE REPLACEMENT ON 01/03/13 - STATES HIS KNEE IS VERY SWOLLEN   . Anemia     was put on iron @ [facility]   Discharge Diagnoses:   Active Problems:   OA (osteoarthritis) of knee  Estimated body mass index is 24 kg/(m^2) as calculated from the following:   Height as of this encounter: 6' (1.829 m).   Weight as of this encounter: 80.287 kg (177 lb).  Procedure:  Procedure(s) (LRB): REIMPLANTATION OF RIGHT TOTAL KNEE (Right)   Consults: None  HPI: Mr. Ditullio is an 80 year old male who has a  complex history in regard to his right knee. A unicompartmental  arthroplasty last summer and developed a hemarthrosis eventually  developing infection in there. He did not respond to irrigation and  debridement and eventually had to have a resection arthroplasty and  placement of an antibiotic spacer. He has had 6 weeks of intravenous  antibiotics and presents now for total knee arthroplasty reimplantation.  Laboratory Data: Admission on 09/16/2013  Component Date Value Ref Range Status  . Specimen Description 09/16/2013 SYNOVIAL FLUID KNEE RIGHT   Final  . Special Requests 09/16/2013 FLUID ON SWAB    Final  . Gram Stain 09/16/2013    Final                   Value:Performed at [facility] FEW WBC PRESENT,BOTH PMN AND MONONUCLEAR                         NO ORGANISMS SEEN                         Performed at [facility]  . Culture 09/16/2013    Final       Value:NO GROWTH 2 DAYS                         Performed at [facility]  . Report Status 09/16/2013 PENDING   Incomplete  . Specimen Description 09/16/2013 SYNOVIAL FLUID KNEE RIGHT   Final  . Special Requests 09/16/2013 FLUID ON SWAB   Final  . Gram Stain 09/16/2013    Final                   Value:FEW WBC PRESENT,BOTH PMN AND MONONUCLEAR                         NO ORGANISMS SEEN  . Report Status 09/16/2013 09/16/2013 FINAL   Final  . Specimen Description 09/16/2013 SYNOVIAL FLUID KNEE RIGHT   Final  . Special Requests 09/16/2013 FLUID ON SWAB   Final  . Gram Stain 09/16/2013    Final                   Value:FEW WBC PRESENT,BOTH  PMN AND MONONUCLEAR                         NO SQUAMOUS EPITHELIAL CELLS SEEN                         NO ORGANISMS SEEN                         Performed at [facility]  . Culture 09/16/2013    Final                   Value:NO ANAEROBES ISOLATED; CULTURE IN PROGRESS FOR 5 DAYS                         Performed at [facility]  . Report Status 09/16/2013 PENDING   Incomplete  . WBC 09/17/2013 8.5  4.0 - 10.5 K/uL Final  . RBC 09/17/2013 3.55* 4.22 - 5.81 MIL/uL Final  . Hemoglobin 09/17/2013 10.6* 13.0 - 17.0 g/dL Final  . HCT 09/17/2013 30.5* 39.0 - 52.0 % Final  . MCV 09/17/2013 85.9  78.0 - 100.0 fL Final  . MCH 09/17/2013 29.9  26.0 - 34.0 pg Final  . MCHC 09/17/2013 34.8  30.0 - 36.0 g/dL Final  . RDW 09/17/2013 13.5  11.5 - 15.5 % Final  . Platelets 09/17/2013 186  150 - 400 K/uL Final  . Sodium 09/17/2013 134* 137 - 147 mEq/L Final  . Potassium 09/17/2013 4.4  3.7 - 5.3 mEq/L Final  . Chloride 09/17/2013 100  96 - 112 mEq/L Final  . CO2 09/17/2013 22  19 - 32 mEq/L Final  . Glucose, Bld 09/17/2013 121* 70 - 99 mg/dL Final  . BUN 09/17/2013 15  6 - 23 mg/dL Final  . Creatinine, Ser 09/17/2013 0.74  0.50 - 1.35 mg/dL Final  . Calcium 09/17/2013 8.5  8.4 - 10.5 mg/dL Final  . GFR calc non Af Amer 09/17/2013 84* >90 mL/min  Final  . GFR calc Af Amer 09/17/2013 >90  >90 mL/min Final   Comment: (NOTE)                          The eGFR has been calculated using the CKD EPI equation.                          This calculation has not been validated in all clinical situations.                          eGFR's persistently <90 mL/min signify possible Chronic Kidney                          Disease.  . WBC 09/18/2013 10.3  4.0 - 10.5 K/uL Final  . RBC 09/18/2013 3.36* 4.22 - 5.81 MIL/uL Final  . Hemoglobin 09/18/2013 10.0* 13.0 - 17.0 g/dL Final  . HCT 09/18/2013 28.7* 39.0 - 52.0 % Final  . MCV 09/18/2013 85.4  78.0 - 100.0 fL Final  . MCH 09/18/2013 29.8  26.0 - 34.0 pg Final  . MCHC 09/18/2013 34.8  30.0 - 36.0 g/dL Final  . RDW 09/18/2013 13.5  11.5 - 15.5 % Final  . Platelets 09/18/2013 187  150 - 400 K/uL Final  .  Sodium 09/18/2013 140  137 - 147 mEq/L Final  . Potassium 09/18/2013 4.4  3.7 - 5.3 mEq/L Final  . Chloride 09/18/2013 102  96 - 112 mEq/L Final  . CO2 09/18/2013 24  19 - 32 mEq/L Final  . Glucose, Bld 09/18/2013 140* 70 - 99 mg/dL Final  . BUN 09/18/2013 12  6 - 23 mg/dL Final  . Creatinine, Ser 09/18/2013 0.65  0.50 - 1.35 mg/dL Final  . Calcium 09/18/2013 9.0  8.4 - 10.5 mg/dL Final  . GFR calc non Af Amer 09/18/2013 88* >90 mL/min Final  . GFR calc Af Amer 09/18/2013 >90  >90 mL/min Final   Comment: (NOTE)                          The eGFR has been calculated using the CKD EPI equation.                          This calculation has not been validated in all clinical situations.                          eGFR's persistently <90 mL/min signify possible Chronic Kidney                          Disease.  Hospital Outpatient Visit on 09/09/2013  Component Date Value Ref Range Status  . aPTT 09/09/2013 34  24 - 37 seconds Final  . WBC 09/09/2013 10.7* 4.0 - 10.5 K/uL Final  . RBC 09/09/2013 4.47  4.22 - 5.81 MIL/uL Final  . Hemoglobin 09/09/2013 13.3  13.0 - 17.0 g/dL Final  . HCT 09/09/2013  38.6* 39.0 - 52.0 % Final  . MCV 09/09/2013 86.4  78.0 - 100.0 fL Final  . MCH 09/09/2013 29.8  26.0 - 34.0 pg Final  . MCHC 09/09/2013 34.5  30.0 - 36.0 g/dL Final  . RDW 09/09/2013 13.6  11.5 - 15.5 % Final  . Platelets 09/09/2013 307  150 - 400 K/uL Final  . Sodium 09/09/2013 134* 137 - 147 mEq/L Final  . Potassium 09/09/2013 4.6  3.7 - 5.3 mEq/L Final  . Chloride 09/09/2013 94* 96 - 112 mEq/L Final  . CO2 09/09/2013 22  19 - 32 mEq/L Final  . Glucose, Bld 09/09/2013 95  70 - 99 mg/dL Final  . BUN 09/09/2013 24* 6 - 23 mg/dL Final  . Creatinine, Ser 09/09/2013 0.73  0.50 - 1.35 mg/dL Final  . Calcium 09/09/2013 9.9  8.4 - 10.5 mg/dL Final  . Total Protein 09/09/2013 8.3  6.0 - 8.3 g/dL Final  . Albumin 09/09/2013 3.9  3.5 - 5.2 g/dL Final  . AST 09/09/2013 20  0 - 37 U/L Final  . ALT 09/09/2013 14  0 - 53 U/L Final  . Alkaline Phosphatase 09/09/2013 89  39 - 117 U/L Final  . Total Bilirubin 09/09/2013 0.3  0.3 - 1.2 mg/dL Final  . GFR calc non Af Amer 09/09/2013 84* >90 mL/min Final  . GFR calc Af Amer 09/09/2013 >90  >90 mL/min Final   Comment: (NOTE)                          The eGFR has been calculated using the CKD EPI equation.  This calculation has not been validated in all clinical situations.                          eGFR's persistently <90 mL/min signify possible Chronic Kidney                          Disease.  [name] Prothrombin Time 09/09/2013 14.3  11.6 - 15.2 seconds Final  . INR 09/09/2013 1.13  0.00 - 1.49 Final  . ABO/RH(D) 09/09/2013 O POS   Final  . Antibody Screen 09/09/2013 NEG   Final  . Sample Expiration 09/09/2013 09/23/2013   Final  . Color, Urine 09/09/2013 YELLOW  YELLOW Final  . APPearance 09/09/2013 CLEAR  CLEAR Final  . Specific Gravity, Urine 09/09/2013 1.010  1.005 - 1.030 Final  . pH 09/09/2013 7.0  5.0 - 8.0 Final  . Glucose, UA 09/09/2013 NEGATIVE  NEGATIVE mg/dL Final  . Hgb urine dipstick 09/09/2013 NEGATIVE  NEGATIVE  Final  . Bilirubin Urine 09/09/2013 NEGATIVE  NEGATIVE Final  . Ketones, ur 09/09/2013 NEGATIVE  NEGATIVE mg/dL Final  . Protein, ur 09/09/2013 NEGATIVE  NEGATIVE mg/dL Final  . Urobilinogen, UA 09/09/2013 0.2  0.0 - 1.0 mg/dL Final  . Nitrite 09/09/2013 NEGATIVE  NEGATIVE Final  . Leukocytes, UA 09/09/2013 SMALL* NEGATIVE Final  . MRSA, PCR 09/09/2013 NEGATIVE  NEGATIVE Final  . Staphylococcus aureus 09/09/2013 NEGATIVE  NEGATIVE Final   Comment:                                 The Xpert SA Assay (FDA                          approved for NASAL specimens                          in patients over 37 years of age),                          is one component of                          a comprehensive surveillance                          program.  Test performance has                          been validated by [employer] for patients greater                          than or equal to 20 year old.                          It is not intended                          to diagnose infection nor to  guide or monitor treatment.  . Squamous Epithelial / LPF 09/09/2013 RARE  RARE Final  . WBC, UA 09/09/2013 0-2  <3 WBC/hpf Final  . Bacteria, UA 09/09/2013 RARE  RARE Final  . ABO/RH(D) 09/09/2013 O POS   Final     X-Rays:No results found.  EKG: Orders placed during the hospital encounter of 01/03/13  . EKG     Hospital Course: THEOTIS GERDEMAN is a 80 y.o. who was admitted to [facility]. They were brought to the operating room on 09/16/2013 and underwent Procedure(s): [place].  Patient tolerated the procedure well and was later transferred to the recovery room and then to the orthopaedic floor for postoperative care.  They were given PO and IV analgesics for pain control following their surgery.  They were given 24 hours of postoperative antibiotics of  Anti-infectives   Start     Dose/Rate Route Frequency  Ordered Stop   09/17/13 0600  ceFAZolin (ANCEF) IVPB 2 g/50 mL premix    Comments:  Ordering one time dose since previous dose of Ancef did not infuse properly. Spoke with Roderic Palau, [facility] who advised this is the best way to make sure patient receives all needed Ancef doses.   2 g 100 mL/hr over 30 Minutes Intravenous  Once 09/17/13 0038 09/17/13 0553   09/16/13 1900  ceFAZolin (ANCEF) IVPB 2 g/50 mL premix     2 g 100 mL/hr over 30 Minutes Intravenous Every 6 hours 09/16/13 1619 09/17/13 0659   09/16/13 0600  ceFAZolin (ANCEF) IVPB 2 g/50 mL premix     2 g 100 mL/hr over 30 Minutes Intravenous On call to O.R. 09/15/13 1405 09/16/13 1246     and started on DVT prophylaxis in the form of Aspirin.   PT and OT were ordered for total joint protocol.  Discharge planning consulted to help with postop disposition and equipment needs.  Patient had a decent night on the evening of surgery.  They started to get up OOB with therapy on day one. Hemovac drain was pulled without difficulty.  Continued to work with therapy into day two.  Dressing was changed on day two and the incision was healing well.  By day three, the patient had progressed with therapy and meeting their goals.  Incision was healing well.  Patient was seen in rounds and was ready to go home.   Discharge Medications: Prior to Admission medications   Medication Sig Start Date End Date Taking? Authorizing Provider  ALPRAZolam Duanne Moron) 0.5 MG tablet Take 0.5 mg by mouth daily as needed for anxiety.   Yes Historical Provider, MD  atenolol (TENORMIN) 50 MG tablet Take 25 mg by mouth every morning. Takes 1/2 tablet   Yes Historical Provider, MD  hydrochlorothiazide (HYDRODIURIL) 25 MG tablet Take 25 mg by mouth every morning.   Yes Historical Provider, MD  lansoprazole (PREVACID) 30 MG capsule Take 30 mg by mouth daily.    Yes Historical Provider, MD  latanoprost (XALATAN) 0.005 % ophthalmic solution Place 1 drop into both eyes at bedtime.   Yes  Historical Provider, MD  oxyCODONE (OXY IR/ROXICODONE) 5 MG immediate release tablet Take 1-2 tablets (5-10 mg total) by mouth every 3 (three) hours as needed for moderate pain or severe pain. Take 1 tablet  + 5 mg by mouth every 3 hours as needed  for moderate pain; take 2 tablets = 10 mg by mouth every 3 hours as need for severe pain 07/22/13  Yes Tieshia  A Durant, NP  polyethylene glycol (MIRALAX / GLYCOLAX) packet Take 17 g by mouth daily as needed for mild constipation. 07/20/13  Yes Myliyah Rebuck Dara Lords, PA-C  valsartan (DIOVAN) 320 MG tablet Take 320 mg by mouth every morning.    Yes Historical Provider, MD  aspirin EC 325 MG EC tablet Take 1 tablet (325 mg total) by mouth daily. 07/20/13   Brindley Madarang Dara Lords, PA-C  methocarbamol (ROBAXIN) 500 MG tablet Take 1 tablet (500 mg total) by mouth every 6 (six) hours as needed for muscle spasms. 09/16/13   [name], MD  oxyCODONE (OXY IR/ROXICODONE) 5 MG immediate release tablet Take 1-2 tablets (5-10 mg total) by mouth every 3 (three) hours as needed for breakthrough pain. 09/16/13   [name], MD  traMADol (ULTRAM) 50 MG tablet Take 1-2 tablets (50-100 mg total) by mouth every 6 (six) hours as needed for moderate pain. 09/16/13   [name], MD   Discharge home with home health  Diet - Regular diet  Follow up - in 2 weeks  Activity - WBAT  Disposition - Home  Condition Upon Discharge - Good  D/C Meds - See DC Summary  DVT Prophylaxis - Aspirin 325 mg daily     Medication List    STOP taking these medications       cephALEXin 500 MG capsule  Commonly known as:  KEFLEX     FEOSOL PO      TAKE these medications       ALPRAZolam 0.5 MG tablet  Commonly known as:  XANAX  Take 0.5 mg by mouth daily as needed for anxiety.     aspirin 325 MG EC tablet  Take 1 tablet (325 mg total) by mouth daily.     atenolol 50 MG tablet  Commonly known as:  TENORMIN  Take 25 mg by mouth every morning. Takes 1/2 tablet      hydrochlorothiazide 25 MG tablet  Commonly known as:  HYDRODIURIL  Take 25 mg by mouth every morning.     lansoprazole 30 MG capsule  Commonly known as:  PREVACID  Take 30 mg by mouth daily.     latanoprost 0.005 % ophthalmic solution  Commonly known as:  XALATAN  Place 1 drop into both eyes at bedtime.     methocarbamol 500 MG tablet  Commonly known as:  ROBAXIN  Take 1 tablet (500 mg total) by mouth every 6 (six) hours as needed for muscle spasms.     oxyCODONE 5 MG immediate release tablet  Commonly known as:  Oxy IR/ROXICODONE  Take 1-2 tablets (5-10 mg total) by mouth every 3 (three) hours as needed for moderate pain or severe pain. Take 1 tablet  + 5 mg by mouth every 3 hours as needed  for moderate pain; take 2 tablets = 10 mg by mouth every 3 hours as need for severe pain     oxyCODONE 5 MG immediate release tablet  Commonly known as:  Oxy IR/ROXICODONE  Take 1-2 tablets (5-10 mg total) by mouth every 3 (three) hours as needed for breakthrough pain.     polyethylene glycol packet  Commonly known as:  MIRALAX / GLYCOLAX  Take 17 g by mouth daily as needed for mild constipation.     traMADol 50 MG tablet  Commonly known as:  ULTRAM  Take 1-2 tablets (50-100 mg total) by mouth every 6 (six) hours as needed for moderate pain.     valsartan 320 MG tablet  Commonly  known as:  DIOVAN  Take 320 mg by mouth every morning.           Follow-up Information   Follow up with [facility]. (for home health physical and occupational therapy)    Contact information:   [address] [place] [address] [phone]       Follow up with [name], MD. Schedule an appointment as soon as possible for a visit on 09/30/2013. (For check up. Call [phone] tomorrow to make the appointment)    Specialty:  Orthopedic Surgery   Contact information:   [address] [place] [address] [phone]       Signed: Mickel Crow 09/19/2013, 6:37 AM

## 2013-09-19 NOTE — Discharge Instructions (Signed)
° °Dr. Sevannah Madia °Total Joint Specialist °[facility] °[address] °[place], [place] [address] °[phone] ° °TOTAL KNEE REPLACEMENT POSTOPERATIVE DIRECTIONS ° ° ° °Knee Rehabilitation, Guidelines Following Surgery  °Results after knee surgery are often greatly improved when you follow the exercise, range of motion and muscle strengthening exercises prescribed by your doctor. Safety measures are also important to protect the knee from further injury. Any time any of these exercises cause you to have increased pain or swelling in your knee joint, decrease the amount until you are comfortable again and slowly increase them. If you have problems or questions, call your caregiver or physical therapist for advice.  ° °HOME CARE INSTRUCTIONS  °Remove items at home which could result in a fall. This includes throw rugs or furniture in walking pathways.  °Continue medications as instructed at time of discharge. °You may have some home medications which will be placed on hold until you complete the course of blood thinner medication.  °You may start showering once you are discharged home but do not submerge the incision under water. Just pat the incision dry and apply a dry gauze dressing on daily. °Walk with walker as instructed.  °You may resume a sexual relationship in one month or when given the OK by  your doctor.  °· Use walker as long as suggested by your caregivers. °· Avoid periods of inactivity such as sitting longer than an hour when not asleep. This helps prevent blood clots.  °You may put full weight on your legs and walk as much as is comfortable.  °You may return to work once you are cleared by your doctor.  °Do not drive a car for 6 weeks or until released by you surgeon.  °· Do not drive while taking narcotics.  °Wear the elastic stockings for three weeks following surgery during the day but you may remove then at night. °Make sure you keep all of your appointments after your  operation with all of your doctors and caregivers. You should call the office at the above phone number and make an appointment for approximately two weeks after the date of your surgery. °Change the dressing daily and reapply a dry dressing each time. °Please pick up a stool softener and laxative for home use as long as you are requiring pain medications. °· Continue to use ice on the knee for pain and swelling from surgery. You may notice swelling that will progress down to the foot and ankle.  This is normal after surgery.  Elevate the leg when you are not up walking on it.   °It is important for you to complete the blood thinner medication as prescribed by your doctor. °· Continue to use the breathing machine which will help keep your temperature down.  It is common for your temperature to cycle up and down following surgery, especially at night when you are not up moving around and exerting yourself.  The breathing machine keeps your lungs expanded and your temperature down. ° °RANGE OF MOTION AND STRENGTHENING EXERCISES  °Rehabilitation of the knee is important following a knee injury or an operation. After just a few days of immobilization, the muscles of the thigh which control the knee become weakened and shrink (atrophy). Knee exercises are designed to build up the tone and strength of the thigh muscles and to improve knee motion. Often times heat used for twenty to thirty minutes before working out will loosen up your tissues and help with improving the   range of motion but do not use heat for the first two weeks following surgery. These exercises can be done on a training (exercise) mat, on the floor, on a table or on a bed. Use what ever works the best and is most comfortable for you Knee exercises include:  °Leg Lifts - While your knee is still immobilized in a splint or cast, you can do straight leg raises. Lift the leg to 60 degrees, hold for 3 sec, and slowly lower the leg. Repeat 10-20 times 2-3  times daily. Perform this exercise against resistance later as your knee gets better.  °Quad and Hamstring Sets - Tighten up the muscle on the front of the thigh (Quad) and hold for 5-10 sec. Repeat this 10-20 times hourly. Hamstring sets are done by pushing the foot backward against an object and holding for 5-10 sec. Repeat as with quad sets.  °A rehabilitation program following serious knee injuries can speed recovery and prevent re-injury in the future due to weakened muscles. Contact your doctor or a physical therapist for more information on knee rehabilitation.  ° °SKILLED REHAB INSTRUCTIONS: °If the patient is transferred to a skilled rehab facility following release from the hospital, a list of the current medications will be sent to the facility for the patient to continue.  When discharged from the skilled rehab facility, please have the facility set up the patient's Home Health Physical Therapy prior to being released. Also, the skilled facility will be responsible for providing the patient with their medications at time of release from the facility to include their pain medication, the muscle relaxants, and their blood thinner medication. If the patient is still at the rehab facility at time of the two week follow up appointment, the skilled rehab facility will also need to assist the patient in arranging follow up appointment in our office and any transportation needs. ° °MAKE SURE YOU:  °Understand these instructions.  °Will watch your condition.  °Will get help right away if you are not doing well or get worse.  ° ° °Pick up stool softner and laxative for home. °Do not submerge incision under water. °May shower. °Continue to use ice for pain and swelling from surgery. ° ° ° ° ° ° ° ° ° ° °

## 2013-09-19 NOTE — Progress Notes (Signed)
Pt discharged to home. D/c instructions given. No questions verbalized. Vitals stable.

## 2013-09-19 NOTE — Progress Notes (Signed)
   Subjective: 3 Days Post-Op Procedure(s) (LRB): REIMPLANTATION OF RIGHT TOTAL KNEE (Right) Patient reports pain as mild.   Patient seen in rounds by Dr. Wynelle Link. Patient is well, and has had no acute complaints or problems Patient is ready to go home  Objective: Vital signs in last 24 hours: Temp:  [97.6 F (36.4 C)-98.6 F (37 C)] 98.6 F (37 C) (03/02 0343) Pulse Rate:  [57-62] 59 (03/02 0343) Resp:  [16-18] 16 (03/02 0343) BP: (104-126)/(58-60) 126/60 mmHg (03/02 0343) SpO2:  [96 %-99 %] 96 % (03/02 0343)  Intake/Output from previous day:  Intake/Output Summary (Last 24 hours) at 09/19/13 0635 Last data filed at 09/19/13 0344  Gross per 24 hour  Intake    800 ml  Output   1250 ml  Net   -450 ml    Intake/Output this shift: Total I/O In: 240 [P.O.:240] Out: 300 [Urine:300]  Labs:  Recent Labs  09/17/13 0428 09/18/13 0506  HGB 10.6* 10.0*    Recent Labs  09/17/13 0428 09/18/13 0506  WBC 8.5 10.3  RBC 3.55* 3.36*  HCT 30.5* 28.7*  PLT 186 187    Recent Labs  09/17/13 0428 09/18/13 0506  NA 134* 140  K 4.4 4.4  CL 100 102  CO2 22 24  BUN 15 12  CREATININE 0.74 0.65  GLUCOSE 121* 140*  CALCIUM 8.5 9.0   No results found for this basename: LABPT, INR,  in the last 72 hours  EXAM: General - Patient is Alert, Appropriate and Oriented Extremity - Neurovascular intact Sensation intact distally Incision - clean, dry, no drainage Motor Function - intact, moving foot and toes well on exam.   Assessment/Plan: 3 Days Post-Op Procedure(s) (LRB): REIMPLANTATION OF RIGHT TOTAL KNEE (Right) Procedure(s) (LRB): REIMPLANTATION OF RIGHT TOTAL KNEE (Right) Past Medical History  Diagnosis Date  . Impaired hearing     NO HEARING AIDS  . Anxiety   . Glaucoma     BILATERAL  . Cataract     LEFT EYE  . Hypertension   . GERD (gastroesophageal reflux disease)   . Arthritis   . Infection 07/15/13    RIGHT KNEE INFECTION  S/P RT UNIKNEE REPLACEMENT ON  01/03/13 - STATES HIS KNEE IS VERY SWOLLEN   . Anemia     was put on iron @ [facility]   Active Problems:   OA (osteoarthritis) of knee  Estimated body mass index is 24 kg/(m^2) as calculated from the following:   Height as of this encounter: 6' (1.829 m).   Weight as of this encounter: 80.287 kg (177 lb). Up with therapy Discharge home with home health Diet - Regular diet Follow up - in 2 weeks Activity - WBAT Disposition - Home Condition Upon Discharge - Good D/C Meds - See DC Summary DVT Prophylaxis - Aspirin 325 mg daily  Fredia Chittenden 09/19/2013, 6:35 AM

## 2013-09-19 NOTE — Progress Notes (Signed)
Physical Therapy Treatment Patient Details Name: PELLEGRINO Cameron MRN: [id number] DOB: January 10, 1929 Today's Date: 09/19/2013 Time: 2542-7062 PT Time Calculation (min): 26 min  PT Assessment / Plan / Recommendation  History of Present Illness Pt is an 80 y/o male with complex history with the R knee and multiple surgeries. Pt is now s/p reimplantation of TKA and is WBAT.    PT Comments   Pt progressing well towards physical therapy goals. Reviewed HEP and car transfer during session. Pt and daughter anticipate d/c home this morning with HHPT to follow.   Follow Up Recommendations  Home health PT     Does the patient have the potential to tolerate intense rehabilitation     Barriers to Discharge        Equipment Recommendations  None recommended by PT    Recommendations for Other Services    Frequency 7X/week   Progress towards PT Goals Progress towards PT goals: Progressing toward goals  Plan Current plan remains appropriate    Precautions / Restrictions Precautions Precautions: Fall;Knee Precaution Booklet Issued: No Precaution Comments: Discussed towel roll under heel and NO pillow under knee  Restrictions Weight Bearing Restrictions: Yes RLE Weight Bearing: Weight bearing as tolerated   Pertinent Vitals/Pain Pt reports 5/10 pain at rest. States he had a pain pill prior to session.     Mobility  Bed Mobility Overal bed mobility: Needs Assistance Bed Mobility: Supine to Sit Supine to sit: Supervision General bed mobility comments: HOB flat and no use of bedrails. Increased time required but no physical assist needed.  Transfers Overall transfer level: Needs assistance Equipment used: Rolling walker (2 wheeled) Transfers: Sit to/from Stand Sit to Stand: Supervision General transfer comment: Pt demonstrated proper hand placement and safety awareness.  Ambulation/Gait Ambulation/Gait assistance: Min guard;Supervision Ambulation Distance (Feet): 250 Feet Assistive device:  Rolling walker (2 wheeled) Gait Pattern/deviations: Step-through pattern;Step-to pattern;Decreased stride length Gait velocity: Decreased Gait velocity interpretation: Below normal speed for age/gender General Gait Details: cues on posture, walker position/use with gait and on incr heel strike and weight bearing on right leg    Exercises Total Joint Exercises Heel Slides: 10 reps Hip ABduction/ADduction: 10 reps Knee Flexion: 5 reps Goniometric ROM: 64 AROM   PT Diagnosis:    PT Problem List:   PT Treatment Interventions:     PT Goals (current goals can now be found in the care plan section) Acute Rehab PT Goals Patient Stated Goal: not stated PT Goal Formulation: With patient/family Time For Goal Achievement: 09/24/13 Potential to Achieve Goals: Good  Visit Information  Last PT Received On: 09/19/13 Assistance Needed: +1 History of Present Illness: Pt is an 80 y/o male with complex history with the R knee and multiple surgeries. Pt is now s/p reimplantation of TKA and is WBAT.     Subjective Data  Subjective: "I'm ready to go home." Patient Stated Goal: not stated   Cognition  Cognition Arousal/Alertness: Awake/alert Behavior During Therapy: WFL for tasks assessed/performed Overall Cognitive Status: Within Functional Limits for tasks assessed    Balance  Balance Overall balance assessment: Needs assistance Sitting-balance support: Feet supported;Bilateral upper extremity supported Sitting balance-Leahy Scale: Good Standing balance support: Bilateral upper extremity supported Standing balance-Leahy Scale: Fair  End of Session PT - End of Session Equipment Utilized During Treatment: Gait belt Activity Tolerance: Patient tolerated treatment well Patient left: in chair;with family/visitor present;with call bell/phone within reach Nurse Communication: Mobility status   GP     Jolyn Lent 09/19/2013, 12:07 PM  Jolyn Lent, [place], DPT [phone]

## 2013-09-20 LAB — BODY FLUID CULTURE: CULTURE: NO GROWTH

## 2013-09-20 NOTE — Anesthesia Postprocedure Evaluation (Signed)
Anesthesia Post Note  Patient: Roger Cameron  Procedure(s) Performed: Procedure(s) (LRB): REIMPLANTATION OF RIGHT TOTAL KNEE (Right)  Anesthesia type: General  Patient location: PACU  Post pain: Pain level controlled and Adequate analgesia  Post assessment: Post-op Vital signs reviewed, Patient's Cardiovascular Status Stable, Respiratory Function Stable, Patent Airway and Pain level controlled  Last Vitals:  Filed Vitals:   09/19/13 0800  BP:   Pulse:   Temp:   Resp: 16    Post vital signs: Reviewed and stable  Level of consciousness: awake, alert  and oriented  Complications: No apparent anesthesia complications

## 2013-09-21 LAB — ANAEROBIC CULTURE

## 2013-09-22 ENCOUNTER — Encounter: Payer: Self-pay | Admitting: Infectious Diseases

## 2013-09-22 DIAGNOSIS — H409 Unspecified glaucoma: Secondary | ICD-10-CM | POA: Diagnosis not present

## 2013-09-22 DIAGNOSIS — K219 Gastro-esophageal reflux disease without esophagitis: Secondary | ICD-10-CM | POA: Diagnosis not present

## 2013-09-22 DIAGNOSIS — I10 Essential (primary) hypertension: Secondary | ICD-10-CM | POA: Diagnosis not present

## 2013-09-22 DIAGNOSIS — F411 Generalized anxiety disorder: Secondary | ICD-10-CM | POA: Diagnosis not present

## 2013-09-22 DIAGNOSIS — T8450XA Infection and inflammatory reaction due to unspecified internal joint prosthesis, initial encounter: Secondary | ICD-10-CM | POA: Diagnosis not present

## 2013-09-22 DIAGNOSIS — Z96659 Presence of unspecified artificial knee joint: Secondary | ICD-10-CM | POA: Diagnosis not present

## 2013-09-23 ENCOUNTER — Encounter: Payer: Self-pay | Admitting: Infectious Diseases

## 2013-09-23 DIAGNOSIS — H409 Unspecified glaucoma: Secondary | ICD-10-CM | POA: Diagnosis not present

## 2013-09-23 DIAGNOSIS — T8450XA Infection and inflammatory reaction due to unspecified internal joint prosthesis, initial encounter: Secondary | ICD-10-CM | POA: Diagnosis not present

## 2013-09-23 DIAGNOSIS — Z96659 Presence of unspecified artificial knee joint: Secondary | ICD-10-CM | POA: Diagnosis not present

## 2013-09-23 DIAGNOSIS — K219 Gastro-esophageal reflux disease without esophagitis: Secondary | ICD-10-CM | POA: Diagnosis not present

## 2013-09-23 DIAGNOSIS — I10 Essential (primary) hypertension: Secondary | ICD-10-CM | POA: Diagnosis not present

## 2013-09-23 DIAGNOSIS — F411 Generalized anxiety disorder: Secondary | ICD-10-CM | POA: Diagnosis not present

## 2013-09-26 DIAGNOSIS — Z96659 Presence of unspecified artificial knee joint: Secondary | ICD-10-CM | POA: Diagnosis not present

## 2013-09-26 DIAGNOSIS — F411 Generalized anxiety disorder: Secondary | ICD-10-CM | POA: Diagnosis not present

## 2013-09-26 DIAGNOSIS — I10 Essential (primary) hypertension: Secondary | ICD-10-CM | POA: Diagnosis not present

## 2013-09-26 DIAGNOSIS — K219 Gastro-esophageal reflux disease without esophagitis: Secondary | ICD-10-CM | POA: Diagnosis not present

## 2013-09-26 DIAGNOSIS — T8450XA Infection and inflammatory reaction due to unspecified internal joint prosthesis, initial encounter: Secondary | ICD-10-CM | POA: Diagnosis not present

## 2013-09-26 DIAGNOSIS — H409 Unspecified glaucoma: Secondary | ICD-10-CM | POA: Diagnosis not present

## 2013-09-28 DIAGNOSIS — K219 Gastro-esophageal reflux disease without esophagitis: Secondary | ICD-10-CM | POA: Diagnosis not present

## 2013-09-28 DIAGNOSIS — T8450XA Infection and inflammatory reaction due to unspecified internal joint prosthesis, initial encounter: Secondary | ICD-10-CM | POA: Diagnosis not present

## 2013-09-28 DIAGNOSIS — H409 Unspecified glaucoma: Secondary | ICD-10-CM | POA: Diagnosis not present

## 2013-09-28 DIAGNOSIS — I10 Essential (primary) hypertension: Secondary | ICD-10-CM | POA: Diagnosis not present

## 2013-09-28 DIAGNOSIS — Z96659 Presence of unspecified artificial knee joint: Secondary | ICD-10-CM | POA: Diagnosis not present

## 2013-09-28 DIAGNOSIS — F411 Generalized anxiety disorder: Secondary | ICD-10-CM | POA: Diagnosis not present

## 2013-09-30 DIAGNOSIS — I10 Essential (primary) hypertension: Secondary | ICD-10-CM | POA: Diagnosis not present

## 2013-09-30 DIAGNOSIS — F411 Generalized anxiety disorder: Secondary | ICD-10-CM | POA: Diagnosis not present

## 2013-09-30 DIAGNOSIS — H409 Unspecified glaucoma: Secondary | ICD-10-CM | POA: Diagnosis not present

## 2013-09-30 DIAGNOSIS — K219 Gastro-esophageal reflux disease without esophagitis: Secondary | ICD-10-CM | POA: Diagnosis not present

## 2013-09-30 DIAGNOSIS — T8450XA Infection and inflammatory reaction due to unspecified internal joint prosthesis, initial encounter: Secondary | ICD-10-CM | POA: Diagnosis not present

## 2013-09-30 DIAGNOSIS — Z96659 Presence of unspecified artificial knee joint: Secondary | ICD-10-CM | POA: Diagnosis not present

## 2013-10-03 DIAGNOSIS — Z96659 Presence of unspecified artificial knee joint: Secondary | ICD-10-CM | POA: Diagnosis not present

## 2013-10-03 DIAGNOSIS — I10 Essential (primary) hypertension: Secondary | ICD-10-CM | POA: Diagnosis not present

## 2013-10-03 DIAGNOSIS — H409 Unspecified glaucoma: Secondary | ICD-10-CM | POA: Diagnosis not present

## 2013-10-03 DIAGNOSIS — T8450XA Infection and inflammatory reaction due to unspecified internal joint prosthesis, initial encounter: Secondary | ICD-10-CM | POA: Diagnosis not present

## 2013-10-03 DIAGNOSIS — K219 Gastro-esophageal reflux disease without esophagitis: Secondary | ICD-10-CM | POA: Diagnosis not present

## 2013-10-03 DIAGNOSIS — F411 Generalized anxiety disorder: Secondary | ICD-10-CM | POA: Diagnosis not present

## 2013-10-05 DIAGNOSIS — I10 Essential (primary) hypertension: Secondary | ICD-10-CM | POA: Diagnosis not present

## 2013-10-05 DIAGNOSIS — H409 Unspecified glaucoma: Secondary | ICD-10-CM | POA: Diagnosis not present

## 2013-10-05 DIAGNOSIS — F411 Generalized anxiety disorder: Secondary | ICD-10-CM | POA: Diagnosis not present

## 2013-10-05 DIAGNOSIS — T8450XA Infection and inflammatory reaction due to unspecified internal joint prosthesis, initial encounter: Secondary | ICD-10-CM | POA: Diagnosis not present

## 2013-10-05 DIAGNOSIS — Z96659 Presence of unspecified artificial knee joint: Secondary | ICD-10-CM | POA: Diagnosis not present

## 2013-10-05 DIAGNOSIS — K219 Gastro-esophageal reflux disease without esophagitis: Secondary | ICD-10-CM | POA: Diagnosis not present

## 2013-10-07 DIAGNOSIS — K219 Gastro-esophageal reflux disease without esophagitis: Secondary | ICD-10-CM | POA: Diagnosis not present

## 2013-10-07 DIAGNOSIS — I10 Essential (primary) hypertension: Secondary | ICD-10-CM | POA: Diagnosis not present

## 2013-10-07 DIAGNOSIS — H409 Unspecified glaucoma: Secondary | ICD-10-CM | POA: Diagnosis not present

## 2013-10-07 DIAGNOSIS — T8450XA Infection and inflammatory reaction due to unspecified internal joint prosthesis, initial encounter: Secondary | ICD-10-CM | POA: Diagnosis not present

## 2013-10-07 DIAGNOSIS — Z96659 Presence of unspecified artificial knee joint: Secondary | ICD-10-CM | POA: Diagnosis not present

## 2013-10-07 DIAGNOSIS — F411 Generalized anxiety disorder: Secondary | ICD-10-CM | POA: Diagnosis not present

## 2013-10-12 DIAGNOSIS — T8450XA Infection and inflammatory reaction due to unspecified internal joint prosthesis, initial encounter: Secondary | ICD-10-CM | POA: Diagnosis not present

## 2013-10-14 DIAGNOSIS — T8450XA Infection and inflammatory reaction due to unspecified internal joint prosthesis, initial encounter: Secondary | ICD-10-CM | POA: Diagnosis not present

## 2013-10-17 DIAGNOSIS — T8450XA Infection and inflammatory reaction due to unspecified internal joint prosthesis, initial encounter: Secondary | ICD-10-CM | POA: Diagnosis not present

## 2013-10-18 DIAGNOSIS — M25569 Pain in unspecified knee: Secondary | ICD-10-CM | POA: Diagnosis not present

## 2013-10-19 DIAGNOSIS — M25569 Pain in unspecified knee: Secondary | ICD-10-CM | POA: Diagnosis not present

## 2013-10-24 DIAGNOSIS — M25569 Pain in unspecified knee: Secondary | ICD-10-CM | POA: Diagnosis not present

## 2013-10-26 DIAGNOSIS — M25569 Pain in unspecified knee: Secondary | ICD-10-CM | POA: Diagnosis not present

## 2013-10-28 DIAGNOSIS — M25569 Pain in unspecified knee: Secondary | ICD-10-CM | POA: Diagnosis not present

## 2013-10-31 DIAGNOSIS — M25569 Pain in unspecified knee: Secondary | ICD-10-CM | POA: Diagnosis not present

## 2013-11-01 DIAGNOSIS — M25569 Pain in unspecified knee: Secondary | ICD-10-CM | POA: Diagnosis not present

## 2013-11-02 DIAGNOSIS — M25569 Pain in unspecified knee: Secondary | ICD-10-CM | POA: Diagnosis not present

## 2013-12-20 DIAGNOSIS — H2640 Unspecified secondary cataract: Secondary | ICD-10-CM | POA: Diagnosis not present

## 2013-12-20 DIAGNOSIS — H4011X0 Primary open-angle glaucoma, stage unspecified: Secondary | ICD-10-CM | POA: Diagnosis not present

## 2013-12-20 DIAGNOSIS — H2510 Age-related nuclear cataract, unspecified eye: Secondary | ICD-10-CM | POA: Diagnosis not present

## 2013-12-20 DIAGNOSIS — H409 Unspecified glaucoma: Secondary | ICD-10-CM | POA: Diagnosis not present

## 2013-12-23 DIAGNOSIS — L259 Unspecified contact dermatitis, unspecified cause: Secondary | ICD-10-CM | POA: Diagnosis not present

## 2014-01-11 DIAGNOSIS — R82998 Other abnormal findings in urine: Secondary | ICD-10-CM | POA: Diagnosis not present

## 2014-01-11 DIAGNOSIS — M109 Gout, unspecified: Secondary | ICD-10-CM | POA: Diagnosis not present

## 2014-01-11 DIAGNOSIS — I10 Essential (primary) hypertension: Secondary | ICD-10-CM | POA: Diagnosis not present

## 2014-01-11 DIAGNOSIS — E785 Hyperlipidemia, unspecified: Secondary | ICD-10-CM | POA: Diagnosis not present

## 2014-01-11 DIAGNOSIS — Z125 Encounter for screening for malignant neoplasm of prostate: Secondary | ICD-10-CM | POA: Diagnosis not present

## 2014-01-11 DIAGNOSIS — R7301 Impaired fasting glucose: Secondary | ICD-10-CM | POA: Diagnosis not present

## 2014-01-18 DIAGNOSIS — R7301 Impaired fasting glucose: Secondary | ICD-10-CM | POA: Diagnosis not present

## 2014-01-18 DIAGNOSIS — K219 Gastro-esophageal reflux disease without esophagitis: Secondary | ICD-10-CM | POA: Diagnosis not present

## 2014-01-18 DIAGNOSIS — I10 Essential (primary) hypertension: Secondary | ICD-10-CM | POA: Diagnosis not present

## 2014-01-18 DIAGNOSIS — Z125 Encounter for screening for malignant neoplasm of prostate: Secondary | ICD-10-CM | POA: Diagnosis not present

## 2014-01-18 DIAGNOSIS — E785 Hyperlipidemia, unspecified: Secondary | ICD-10-CM | POA: Diagnosis not present

## 2014-01-18 DIAGNOSIS — H409 Unspecified glaucoma: Secondary | ICD-10-CM | POA: Diagnosis not present

## 2014-01-18 DIAGNOSIS — M109 Gout, unspecified: Secondary | ICD-10-CM | POA: Diagnosis not present

## 2014-01-18 DIAGNOSIS — Z1331 Encounter for screening for depression: Secondary | ICD-10-CM | POA: Diagnosis not present

## 2014-01-18 DIAGNOSIS — Z0000 Encounter for general adult medical examination without abnormal findings: Secondary | ICD-10-CM | POA: Diagnosis not present

## 2014-01-23 DIAGNOSIS — Z1212 Encounter for screening for malignant neoplasm of rectum: Secondary | ICD-10-CM | POA: Diagnosis not present

## 2014-02-16 DIAGNOSIS — Z96659 Presence of unspecified artificial knee joint: Secondary | ICD-10-CM | POA: Diagnosis not present

## 2014-02-16 DIAGNOSIS — Z471 Aftercare following joint replacement surgery: Secondary | ICD-10-CM | POA: Diagnosis not present

## 2014-04-17 DIAGNOSIS — Z23 Encounter for immunization: Secondary | ICD-10-CM | POA: Diagnosis not present

## 2014-06-19 DIAGNOSIS — H26491 Other secondary cataract, right eye: Secondary | ICD-10-CM | POA: Diagnosis not present

## 2014-06-19 DIAGNOSIS — H4011X1 Primary open-angle glaucoma, mild stage: Secondary | ICD-10-CM | POA: Diagnosis not present

## 2014-06-19 DIAGNOSIS — H25012 Cortical age-related cataract, left eye: Secondary | ICD-10-CM | POA: Diagnosis not present

## 2014-06-19 DIAGNOSIS — H43813 Vitreous degeneration, bilateral: Secondary | ICD-10-CM | POA: Diagnosis not present

## 2014-07-12 DIAGNOSIS — I10 Essential (primary) hypertension: Secondary | ICD-10-CM | POA: Diagnosis not present

## 2014-07-12 DIAGNOSIS — K219 Gastro-esophageal reflux disease without esophagitis: Secondary | ICD-10-CM | POA: Diagnosis not present

## 2014-07-12 DIAGNOSIS — E785 Hyperlipidemia, unspecified: Secondary | ICD-10-CM | POA: Diagnosis not present

## 2014-07-12 DIAGNOSIS — F5221 Male erectile disorder: Secondary | ICD-10-CM | POA: Diagnosis not present

## 2014-07-12 DIAGNOSIS — Z23 Encounter for immunization: Secondary | ICD-10-CM | POA: Diagnosis not present

## 2014-07-12 DIAGNOSIS — M109 Gout, unspecified: Secondary | ICD-10-CM | POA: Diagnosis not present

## 2014-07-12 DIAGNOSIS — R7302 Impaired glucose tolerance (oral): Secondary | ICD-10-CM | POA: Diagnosis not present

## 2014-07-12 DIAGNOSIS — H409 Unspecified glaucoma: Secondary | ICD-10-CM | POA: Diagnosis not present

## 2014-07-12 DIAGNOSIS — F418 Other specified anxiety disorders: Secondary | ICD-10-CM | POA: Diagnosis not present

## 2014-09-19 DIAGNOSIS — Z471 Aftercare following joint replacement surgery: Secondary | ICD-10-CM | POA: Diagnosis not present

## 2014-09-19 DIAGNOSIS — Z96651 Presence of right artificial knee joint: Secondary | ICD-10-CM | POA: Diagnosis not present

## 2014-09-26 DIAGNOSIS — L570 Actinic keratosis: Secondary | ICD-10-CM | POA: Diagnosis not present

## 2014-09-26 DIAGNOSIS — D044 Carcinoma in situ of skin of scalp and neck: Secondary | ICD-10-CM | POA: Diagnosis not present

## 2014-09-26 DIAGNOSIS — X32XXXD Exposure to sunlight, subsequent encounter: Secondary | ICD-10-CM | POA: Diagnosis not present

## 2014-09-26 DIAGNOSIS — C44222 Squamous cell carcinoma of skin of right ear and external auricular canal: Secondary | ICD-10-CM | POA: Diagnosis not present

## 2014-10-09 DIAGNOSIS — M779 Enthesopathy, unspecified: Secondary | ICD-10-CM | POA: Diagnosis not present

## 2014-10-09 DIAGNOSIS — L97511 Non-pressure chronic ulcer of other part of right foot limited to breakdown of skin: Secondary | ICD-10-CM | POA: Diagnosis not present

## 2014-10-09 DIAGNOSIS — M71571 Other bursitis, not elsewhere classified, right ankle and foot: Secondary | ICD-10-CM | POA: Diagnosis not present

## 2014-10-09 DIAGNOSIS — M7751 Other enthesopathy of right foot: Secondary | ICD-10-CM | POA: Diagnosis not present

## 2014-10-12 DIAGNOSIS — M7751 Other enthesopathy of right foot: Secondary | ICD-10-CM | POA: Diagnosis not present

## 2014-10-12 DIAGNOSIS — L97511 Non-pressure chronic ulcer of other part of right foot limited to breakdown of skin: Secondary | ICD-10-CM | POA: Diagnosis not present

## 2014-10-12 DIAGNOSIS — M71571 Other bursitis, not elsewhere classified, right ankle and foot: Secondary | ICD-10-CM | POA: Diagnosis not present

## 2014-10-25 DIAGNOSIS — Z85828 Personal history of other malignant neoplasm of skin: Secondary | ICD-10-CM | POA: Diagnosis not present

## 2014-10-25 DIAGNOSIS — Z08 Encounter for follow-up examination after completed treatment for malignant neoplasm: Secondary | ICD-10-CM | POA: Diagnosis not present

## 2014-10-27 DIAGNOSIS — L97511 Non-pressure chronic ulcer of other part of right foot limited to breakdown of skin: Secondary | ICD-10-CM | POA: Diagnosis not present

## 2014-11-24 DIAGNOSIS — L97511 Non-pressure chronic ulcer of other part of right foot limited to breakdown of skin: Secondary | ICD-10-CM | POA: Diagnosis not present

## 2014-12-19 DIAGNOSIS — H25012 Cortical age-related cataract, left eye: Secondary | ICD-10-CM | POA: Diagnosis not present

## 2014-12-19 DIAGNOSIS — H26491 Other secondary cataract, right eye: Secondary | ICD-10-CM | POA: Diagnosis not present

## 2014-12-19 DIAGNOSIS — H4011X1 Primary open-angle glaucoma, mild stage: Secondary | ICD-10-CM | POA: Diagnosis not present

## 2014-12-29 DIAGNOSIS — L97511 Non-pressure chronic ulcer of other part of right foot limited to breakdown of skin: Secondary | ICD-10-CM | POA: Diagnosis not present

## 2015-01-15 ENCOUNTER — Other Ambulatory Visit: Payer: Self-pay

## 2015-01-23 DIAGNOSIS — Z125 Encounter for screening for malignant neoplasm of prostate: Secondary | ICD-10-CM | POA: Diagnosis not present

## 2015-01-23 DIAGNOSIS — N390 Urinary tract infection, site not specified: Secondary | ICD-10-CM | POA: Diagnosis not present

## 2015-01-23 DIAGNOSIS — R7302 Impaired glucose tolerance (oral): Secondary | ICD-10-CM | POA: Diagnosis not present

## 2015-01-23 DIAGNOSIS — I10 Essential (primary) hypertension: Secondary | ICD-10-CM | POA: Diagnosis not present

## 2015-01-23 DIAGNOSIS — M109 Gout, unspecified: Secondary | ICD-10-CM | POA: Diagnosis not present

## 2015-01-23 DIAGNOSIS — E785 Hyperlipidemia, unspecified: Secondary | ICD-10-CM | POA: Diagnosis not present

## 2015-01-24 DIAGNOSIS — R8290 Unspecified abnormal findings in urine: Secondary | ICD-10-CM | POA: Diagnosis not present

## 2015-02-21 DIAGNOSIS — X32XXXD Exposure to sunlight, subsequent encounter: Secondary | ICD-10-CM | POA: Diagnosis not present

## 2015-02-21 DIAGNOSIS — Z1283 Encounter for screening for malignant neoplasm of skin: Secondary | ICD-10-CM | POA: Diagnosis not present

## 2015-02-21 DIAGNOSIS — L570 Actinic keratosis: Secondary | ICD-10-CM | POA: Diagnosis not present

## 2015-03-08 DIAGNOSIS — Z6827 Body mass index (BMI) 27.0-27.9, adult: Secondary | ICD-10-CM | POA: Diagnosis not present

## 2015-03-08 DIAGNOSIS — E785 Hyperlipidemia, unspecified: Secondary | ICD-10-CM | POA: Diagnosis not present

## 2015-03-08 DIAGNOSIS — K219 Gastro-esophageal reflux disease without esophagitis: Secondary | ICD-10-CM | POA: Diagnosis not present

## 2015-03-08 DIAGNOSIS — M545 Low back pain: Secondary | ICD-10-CM | POA: Diagnosis not present

## 2015-03-08 DIAGNOSIS — F418 Other specified anxiety disorders: Secondary | ICD-10-CM | POA: Diagnosis not present

## 2015-03-08 DIAGNOSIS — M109 Gout, unspecified: Secondary | ICD-10-CM | POA: Diagnosis not present

## 2015-03-08 DIAGNOSIS — F5221 Male erectile disorder: Secondary | ICD-10-CM | POA: Diagnosis not present

## 2015-03-08 DIAGNOSIS — H409 Unspecified glaucoma: Secondary | ICD-10-CM | POA: Diagnosis not present

## 2015-03-08 DIAGNOSIS — Z1389 Encounter for screening for other disorder: Secondary | ICD-10-CM | POA: Diagnosis not present

## 2015-03-08 DIAGNOSIS — Z0000 Encounter for general adult medical examination without abnormal findings: Secondary | ICD-10-CM | POA: Diagnosis not present

## 2015-03-08 DIAGNOSIS — I10 Essential (primary) hypertension: Secondary | ICD-10-CM | POA: Diagnosis not present

## 2015-03-08 DIAGNOSIS — R7302 Impaired glucose tolerance (oral): Secondary | ICD-10-CM | POA: Diagnosis not present

## 2015-03-19 DIAGNOSIS — Z1212 Encounter for screening for malignant neoplasm of rectum: Secondary | ICD-10-CM | POA: Diagnosis not present

## 2015-04-10 DIAGNOSIS — Z23 Encounter for immunization: Secondary | ICD-10-CM | POA: Diagnosis not present

## 2015-06-20 DIAGNOSIS — H26491 Other secondary cataract, right eye: Secondary | ICD-10-CM | POA: Diagnosis not present

## 2015-06-20 DIAGNOSIS — H401122 Primary open-angle glaucoma, left eye, moderate stage: Secondary | ICD-10-CM | POA: Diagnosis not present

## 2015-06-20 DIAGNOSIS — H25012 Cortical age-related cataract, left eye: Secondary | ICD-10-CM | POA: Diagnosis not present

## 2015-06-20 DIAGNOSIS — H401111 Primary open-angle glaucoma, right eye, mild stage: Secondary | ICD-10-CM | POA: Diagnosis not present

## 2015-08-28 DIAGNOSIS — Z471 Aftercare following joint replacement surgery: Secondary | ICD-10-CM | POA: Diagnosis not present

## 2015-08-28 DIAGNOSIS — Z96651 Presence of right artificial knee joint: Secondary | ICD-10-CM | POA: Diagnosis not present

## 2015-09-14 DIAGNOSIS — Z6826 Body mass index (BMI) 26.0-26.9, adult: Secondary | ICD-10-CM | POA: Diagnosis not present

## 2015-09-14 DIAGNOSIS — K219 Gastro-esophageal reflux disease without esophagitis: Secondary | ICD-10-CM | POA: Diagnosis not present

## 2015-09-14 DIAGNOSIS — E871 Hypo-osmolality and hyponatremia: Secondary | ICD-10-CM | POA: Diagnosis not present

## 2015-09-14 DIAGNOSIS — F411 Generalized anxiety disorder: Secondary | ICD-10-CM | POA: Diagnosis not present

## 2015-09-14 DIAGNOSIS — I10 Essential (primary) hypertension: Secondary | ICD-10-CM | POA: Diagnosis not present

## 2015-09-14 DIAGNOSIS — R7302 Impaired glucose tolerance (oral): Secondary | ICD-10-CM | POA: Diagnosis not present

## 2015-09-14 DIAGNOSIS — Z1389 Encounter for screening for other disorder: Secondary | ICD-10-CM | POA: Diagnosis not present

## 2015-09-14 DIAGNOSIS — M109 Gout, unspecified: Secondary | ICD-10-CM | POA: Diagnosis not present

## 2015-09-14 DIAGNOSIS — E7800 Pure hypercholesterolemia, unspecified: Secondary | ICD-10-CM | POA: Diagnosis not present

## 2015-12-19 DIAGNOSIS — Z79899 Other long term (current) drug therapy: Secondary | ICD-10-CM | POA: Diagnosis not present

## 2015-12-19 DIAGNOSIS — E782 Mixed hyperlipidemia: Secondary | ICD-10-CM | POA: Diagnosis not present

## 2015-12-19 DIAGNOSIS — Z0000 Encounter for general adult medical examination without abnormal findings: Secondary | ICD-10-CM | POA: Diagnosis not present

## 2015-12-19 DIAGNOSIS — N320 Bladder-neck obstruction: Secondary | ICD-10-CM | POA: Diagnosis not present

## 2015-12-19 DIAGNOSIS — E039 Hypothyroidism, unspecified: Secondary | ICD-10-CM | POA: Diagnosis not present

## 2015-12-19 DIAGNOSIS — E119 Type 2 diabetes mellitus without complications: Secondary | ICD-10-CM | POA: Diagnosis not present

## 2016-04-16 DIAGNOSIS — N320 Bladder-neck obstruction: Secondary | ICD-10-CM | POA: Diagnosis not present

## 2016-04-16 DIAGNOSIS — Z79899 Other long term (current) drug therapy: Secondary | ICD-10-CM | POA: Diagnosis not present

## 2016-04-16 DIAGNOSIS — E039 Hypothyroidism, unspecified: Secondary | ICD-10-CM | POA: Diagnosis not present

## 2016-04-16 DIAGNOSIS — I10 Essential (primary) hypertension: Secondary | ICD-10-CM | POA: Diagnosis not present

## 2016-04-16 DIAGNOSIS — E119 Type 2 diabetes mellitus without complications: Secondary | ICD-10-CM | POA: Diagnosis not present

## 2016-04-16 DIAGNOSIS — Z23 Encounter for immunization: Secondary | ICD-10-CM | POA: Diagnosis not present

## 2016-04-16 DIAGNOSIS — E782 Mixed hyperlipidemia: Secondary | ICD-10-CM | POA: Diagnosis not present

## 2016-05-21 DIAGNOSIS — H40013 Open angle with borderline findings, low risk, bilateral: Secondary | ICD-10-CM | POA: Diagnosis not present

## 2016-05-21 DIAGNOSIS — H26491 Other secondary cataract, right eye: Secondary | ICD-10-CM | POA: Diagnosis not present

## 2016-05-21 DIAGNOSIS — H25812 Combined forms of age-related cataract, left eye: Secondary | ICD-10-CM | POA: Diagnosis not present

## 2016-05-21 DIAGNOSIS — H353131 Nonexudative age-related macular degeneration, bilateral, early dry stage: Secondary | ICD-10-CM | POA: Diagnosis not present

## 2016-09-02 DIAGNOSIS — H25812 Combined forms of age-related cataract, left eye: Secondary | ICD-10-CM | POA: Diagnosis not present

## 2016-09-02 DIAGNOSIS — H40013 Open angle with borderline findings, low risk, bilateral: Secondary | ICD-10-CM | POA: Diagnosis not present

## 2016-10-15 DIAGNOSIS — J449 Chronic obstructive pulmonary disease, unspecified: Secondary | ICD-10-CM | POA: Diagnosis not present

## 2016-10-15 DIAGNOSIS — I10 Essential (primary) hypertension: Secondary | ICD-10-CM | POA: Diagnosis not present

## 2016-10-15 DIAGNOSIS — E039 Hypothyroidism, unspecified: Secondary | ICD-10-CM | POA: Diagnosis not present

## 2016-10-15 DIAGNOSIS — E782 Mixed hyperlipidemia: Secondary | ICD-10-CM | POA: Diagnosis not present

## 2016-10-15 DIAGNOSIS — Z79899 Other long term (current) drug therapy: Secondary | ICD-10-CM | POA: Diagnosis not present

## 2016-10-15 DIAGNOSIS — N320 Bladder-neck obstruction: Secondary | ICD-10-CM | POA: Diagnosis not present

## 2016-10-15 DIAGNOSIS — E119 Type 2 diabetes mellitus without complications: Secondary | ICD-10-CM | POA: Diagnosis not present

## 2017-02-24 DIAGNOSIS — H40013 Open angle with borderline findings, low risk, bilateral: Secondary | ICD-10-CM | POA: Diagnosis not present

## 2017-02-24 DIAGNOSIS — H25812 Combined forms of age-related cataract, left eye: Secondary | ICD-10-CM | POA: Diagnosis not present

## 2017-02-24 DIAGNOSIS — Z961 Presence of intraocular lens: Secondary | ICD-10-CM | POA: Diagnosis not present

## 2017-02-24 DIAGNOSIS — H353131 Nonexudative age-related macular degeneration, bilateral, early dry stage: Secondary | ICD-10-CM | POA: Diagnosis not present

## 2017-03-04 DIAGNOSIS — R490 Dysphonia: Secondary | ICD-10-CM | POA: Diagnosis not present

## 2017-03-04 DIAGNOSIS — R220 Localized swelling, mass and lump, head: Secondary | ICD-10-CM | POA: Diagnosis not present

## 2017-03-04 DIAGNOSIS — I6523 Occlusion and stenosis of bilateral carotid arteries: Secondary | ICD-10-CM | POA: Diagnosis not present

## 2017-03-04 DIAGNOSIS — J383 Other diseases of vocal cords: Secondary | ICD-10-CM | POA: Diagnosis not present

## 2017-03-04 DIAGNOSIS — C320 Malignant neoplasm of glottis: Secondary | ICD-10-CM | POA: Diagnosis not present

## 2017-03-04 DIAGNOSIS — I672 Cerebral atherosclerosis: Secondary | ICD-10-CM | POA: Diagnosis not present

## 2017-03-09 DIAGNOSIS — J383 Other diseases of vocal cords: Secondary | ICD-10-CM | POA: Diagnosis not present

## 2017-03-13 DIAGNOSIS — C329 Malignant neoplasm of larynx, unspecified: Secondary | ICD-10-CM | POA: Diagnosis not present

## 2017-03-13 DIAGNOSIS — R911 Solitary pulmonary nodule: Secondary | ICD-10-CM | POA: Diagnosis not present

## 2017-03-13 DIAGNOSIS — I7789 Other specified disorders of arteries and arterioles: Secondary | ICD-10-CM | POA: Diagnosis not present

## 2017-03-13 DIAGNOSIS — I517 Cardiomegaly: Secondary | ICD-10-CM | POA: Diagnosis not present

## 2017-03-13 DIAGNOSIS — R918 Other nonspecific abnormal finding of lung field: Secondary | ICD-10-CM | POA: Diagnosis not present

## 2017-03-13 DIAGNOSIS — J9811 Atelectasis: Secondary | ICD-10-CM | POA: Diagnosis not present

## 2017-03-13 DIAGNOSIS — M5030 Other cervical disc degeneration, unspecified cervical region: Secondary | ICD-10-CM | POA: Diagnosis not present

## 2017-03-13 DIAGNOSIS — Z79899 Other long term (current) drug therapy: Secondary | ICD-10-CM | POA: Diagnosis not present

## 2017-03-13 DIAGNOSIS — R490 Dysphonia: Secondary | ICD-10-CM | POA: Diagnosis not present

## 2017-03-13 DIAGNOSIS — Z01818 Encounter for other preprocedural examination: Secondary | ICD-10-CM | POA: Diagnosis not present

## 2017-03-13 DIAGNOSIS — J383 Other diseases of vocal cords: Secondary | ICD-10-CM | POA: Diagnosis not present

## 2017-03-13 DIAGNOSIS — I350 Nonrheumatic aortic (valve) stenosis: Secondary | ICD-10-CM | POA: Diagnosis not present

## 2017-03-13 DIAGNOSIS — I77810 Thoracic aortic ectasia: Secondary | ICD-10-CM | POA: Diagnosis not present

## 2017-03-13 DIAGNOSIS — I700 Atherosclerosis of aorta: Secondary | ICD-10-CM | POA: Diagnosis not present

## 2017-03-13 DIAGNOSIS — I10 Essential (primary) hypertension: Secondary | ICD-10-CM | POA: Diagnosis not present

## 2017-03-13 DIAGNOSIS — R220 Localized swelling, mass and lump, head: Secondary | ICD-10-CM | POA: Diagnosis not present

## 2017-03-13 DIAGNOSIS — D7389 Other diseases of spleen: Secondary | ICD-10-CM | POA: Diagnosis not present

## 2017-03-16 DIAGNOSIS — J387 Other diseases of larynx: Secondary | ICD-10-CM | POA: Diagnosis not present

## 2017-03-16 DIAGNOSIS — I10 Essential (primary) hypertension: Secondary | ICD-10-CM | POA: Diagnosis not present

## 2017-03-16 DIAGNOSIS — R490 Dysphonia: Secondary | ICD-10-CM | POA: Diagnosis not present

## 2017-03-16 DIAGNOSIS — J383 Other diseases of vocal cords: Secondary | ICD-10-CM | POA: Diagnosis not present

## 2017-03-16 DIAGNOSIS — C328 Malignant neoplasm of overlapping sites of larynx: Secondary | ICD-10-CM | POA: Diagnosis not present

## 2017-03-16 DIAGNOSIS — K228 Other specified diseases of esophagus: Secondary | ICD-10-CM | POA: Diagnosis not present

## 2017-03-16 DIAGNOSIS — C329 Malignant neoplasm of larynx, unspecified: Secondary | ICD-10-CM | POA: Diagnosis not present

## 2017-03-18 DIAGNOSIS — E782 Mixed hyperlipidemia: Secondary | ICD-10-CM | POA: Diagnosis not present

## 2017-03-18 DIAGNOSIS — I4891 Unspecified atrial fibrillation: Secondary | ICD-10-CM | POA: Diagnosis not present

## 2017-03-18 DIAGNOSIS — E119 Type 2 diabetes mellitus without complications: Secondary | ICD-10-CM | POA: Diagnosis not present

## 2017-03-18 DIAGNOSIS — N320 Bladder-neck obstruction: Secondary | ICD-10-CM | POA: Diagnosis not present

## 2017-03-18 DIAGNOSIS — Z79899 Other long term (current) drug therapy: Secondary | ICD-10-CM | POA: Diagnosis not present

## 2017-03-18 DIAGNOSIS — E039 Hypothyroidism, unspecified: Secondary | ICD-10-CM | POA: Diagnosis not present

## 2017-03-24 DIAGNOSIS — C329 Malignant neoplasm of larynx, unspecified: Secondary | ICD-10-CM | POA: Diagnosis not present

## 2017-03-24 DIAGNOSIS — R490 Dysphonia: Secondary | ICD-10-CM | POA: Diagnosis not present

## 2017-03-24 DIAGNOSIS — J3800 Paralysis of vocal cords and larynx, unspecified: Secondary | ICD-10-CM | POA: Diagnosis not present

## 2017-03-26 DIAGNOSIS — J3800 Paralysis of vocal cords and larynx, unspecified: Secondary | ICD-10-CM | POA: Diagnosis present

## 2017-03-26 DIAGNOSIS — I4891 Unspecified atrial fibrillation: Secondary | ICD-10-CM | POA: Diagnosis present

## 2017-03-26 DIAGNOSIS — I10 Essential (primary) hypertension: Secondary | ICD-10-CM | POA: Diagnosis present

## 2017-03-26 DIAGNOSIS — C320 Malignant neoplasm of glottis: Secondary | ICD-10-CM | POA: Diagnosis not present

## 2017-03-26 DIAGNOSIS — R490 Dysphonia: Secondary | ICD-10-CM | POA: Diagnosis present

## 2017-03-26 DIAGNOSIS — Z4682 Encounter for fitting and adjustment of non-vascular catheter: Secondary | ICD-10-CM | POA: Diagnosis not present

## 2017-03-26 DIAGNOSIS — C329 Malignant neoplasm of larynx, unspecified: Secondary | ICD-10-CM | POA: Diagnosis present

## 2017-03-26 DIAGNOSIS — Z9002 Acquired absence of larynx: Secondary | ICD-10-CM | POA: Diagnosis not present

## 2017-04-06 DIAGNOSIS — Z483 Aftercare following surgery for neoplasm: Secondary | ICD-10-CM | POA: Diagnosis not present

## 2017-04-06 DIAGNOSIS — Z96651 Presence of right artificial knee joint: Secondary | ICD-10-CM | POA: Diagnosis not present

## 2017-04-06 DIAGNOSIS — Z430 Encounter for attention to tracheostomy: Secondary | ICD-10-CM | POA: Diagnosis not present

## 2017-04-06 DIAGNOSIS — C329 Malignant neoplasm of larynx, unspecified: Secondary | ICD-10-CM | POA: Diagnosis not present

## 2017-04-06 DIAGNOSIS — Z9002 Acquired absence of larynx: Secondary | ICD-10-CM | POA: Diagnosis not present

## 2017-04-06 DIAGNOSIS — I10 Essential (primary) hypertension: Secondary | ICD-10-CM | POA: Diagnosis not present

## 2017-04-06 DIAGNOSIS — H9190 Unspecified hearing loss, unspecified ear: Secondary | ICD-10-CM | POA: Diagnosis not present

## 2017-04-06 DIAGNOSIS — Z963 Presence of artificial larynx: Secondary | ICD-10-CM | POA: Diagnosis not present

## 2017-04-07 DIAGNOSIS — I10 Essential (primary) hypertension: Secondary | ICD-10-CM | POA: Diagnosis not present

## 2017-04-07 DIAGNOSIS — C329 Malignant neoplasm of larynx, unspecified: Secondary | ICD-10-CM | POA: Diagnosis not present

## 2017-04-07 DIAGNOSIS — H9190 Unspecified hearing loss, unspecified ear: Secondary | ICD-10-CM | POA: Diagnosis not present

## 2017-04-07 DIAGNOSIS — Z483 Aftercare following surgery for neoplasm: Secondary | ICD-10-CM | POA: Diagnosis not present

## 2017-04-07 DIAGNOSIS — Z430 Encounter for attention to tracheostomy: Secondary | ICD-10-CM | POA: Diagnosis not present

## 2017-04-07 DIAGNOSIS — Z9002 Acquired absence of larynx: Secondary | ICD-10-CM | POA: Diagnosis not present

## 2017-04-09 DIAGNOSIS — Z483 Aftercare following surgery for neoplasm: Secondary | ICD-10-CM | POA: Diagnosis not present

## 2017-04-09 DIAGNOSIS — Z430 Encounter for attention to tracheostomy: Secondary | ICD-10-CM | POA: Diagnosis not present

## 2017-04-09 DIAGNOSIS — Z9002 Acquired absence of larynx: Secondary | ICD-10-CM | POA: Diagnosis not present

## 2017-04-09 DIAGNOSIS — I10 Essential (primary) hypertension: Secondary | ICD-10-CM | POA: Diagnosis not present

## 2017-04-09 DIAGNOSIS — C329 Malignant neoplasm of larynx, unspecified: Secondary | ICD-10-CM | POA: Diagnosis not present

## 2017-04-09 DIAGNOSIS — H9190 Unspecified hearing loss, unspecified ear: Secondary | ICD-10-CM | POA: Diagnosis not present

## 2017-04-10 DIAGNOSIS — Z23 Encounter for immunization: Secondary | ICD-10-CM | POA: Diagnosis not present

## 2017-04-13 DIAGNOSIS — Z430 Encounter for attention to tracheostomy: Secondary | ICD-10-CM | POA: Diagnosis not present

## 2017-04-13 DIAGNOSIS — C329 Malignant neoplasm of larynx, unspecified: Secondary | ICD-10-CM | POA: Diagnosis not present

## 2017-04-13 DIAGNOSIS — I10 Essential (primary) hypertension: Secondary | ICD-10-CM | POA: Diagnosis not present

## 2017-04-13 DIAGNOSIS — Z9002 Acquired absence of larynx: Secondary | ICD-10-CM | POA: Diagnosis not present

## 2017-04-13 DIAGNOSIS — Z483 Aftercare following surgery for neoplasm: Secondary | ICD-10-CM | POA: Diagnosis not present

## 2017-04-13 DIAGNOSIS — H9190 Unspecified hearing loss, unspecified ear: Secondary | ICD-10-CM | POA: Diagnosis not present

## 2017-04-14 DIAGNOSIS — Z9002 Acquired absence of larynx: Secondary | ICD-10-CM | POA: Diagnosis not present

## 2017-04-14 DIAGNOSIS — R491 Aphonia: Secondary | ICD-10-CM | POA: Diagnosis not present

## 2017-04-14 DIAGNOSIS — C329 Malignant neoplasm of larynx, unspecified: Secondary | ICD-10-CM | POA: Diagnosis not present

## 2017-04-16 DIAGNOSIS — Z9002 Acquired absence of larynx: Secondary | ICD-10-CM | POA: Diagnosis not present

## 2017-04-16 DIAGNOSIS — Z483 Aftercare following surgery for neoplasm: Secondary | ICD-10-CM | POA: Diagnosis not present

## 2017-04-16 DIAGNOSIS — I10 Essential (primary) hypertension: Secondary | ICD-10-CM | POA: Diagnosis not present

## 2017-04-16 DIAGNOSIS — H9190 Unspecified hearing loss, unspecified ear: Secondary | ICD-10-CM | POA: Diagnosis not present

## 2017-04-16 DIAGNOSIS — Z430 Encounter for attention to tracheostomy: Secondary | ICD-10-CM | POA: Diagnosis not present

## 2017-04-16 DIAGNOSIS — C329 Malignant neoplasm of larynx, unspecified: Secondary | ICD-10-CM | POA: Diagnosis not present

## 2017-04-21 DIAGNOSIS — Z9002 Acquired absence of larynx: Secondary | ICD-10-CM | POA: Diagnosis not present

## 2017-04-21 DIAGNOSIS — C329 Malignant neoplasm of larynx, unspecified: Secondary | ICD-10-CM | POA: Diagnosis not present

## 2017-04-21 DIAGNOSIS — J383 Other diseases of vocal cords: Secondary | ICD-10-CM | POA: Diagnosis not present

## 2017-04-21 DIAGNOSIS — R490 Dysphonia: Secondary | ICD-10-CM | POA: Diagnosis not present

## 2017-04-21 DIAGNOSIS — I10 Essential (primary) hypertension: Secondary | ICD-10-CM | POA: Diagnosis not present

## 2017-04-21 DIAGNOSIS — H9190 Unspecified hearing loss, unspecified ear: Secondary | ICD-10-CM | POA: Diagnosis not present

## 2017-04-28 DIAGNOSIS — C329 Malignant neoplasm of larynx, unspecified: Secondary | ICD-10-CM | POA: Diagnosis not present

## 2017-04-30 DIAGNOSIS — Z803 Family history of malignant neoplasm of breast: Secondary | ICD-10-CM | POA: Diagnosis not present

## 2017-04-30 DIAGNOSIS — D496 Neoplasm of unspecified behavior of brain: Secondary | ICD-10-CM | POA: Diagnosis not present

## 2017-04-30 DIAGNOSIS — I10 Essential (primary) hypertension: Secondary | ICD-10-CM | POA: Diagnosis not present

## 2017-04-30 DIAGNOSIS — Z9002 Acquired absence of larynx: Secondary | ICD-10-CM | POA: Diagnosis not present

## 2017-04-30 DIAGNOSIS — Z96659 Presence of unspecified artificial knee joint: Secondary | ICD-10-CM | POA: Diagnosis not present

## 2017-04-30 DIAGNOSIS — C323 Malignant neoplasm of laryngeal cartilage: Secondary | ICD-10-CM | POA: Diagnosis not present

## 2017-04-30 DIAGNOSIS — Z510 Encounter for antineoplastic radiation therapy: Secondary | ICD-10-CM | POA: Diagnosis not present

## 2017-04-30 DIAGNOSIS — C329 Malignant neoplasm of larynx, unspecified: Secondary | ICD-10-CM | POA: Diagnosis not present

## 2017-05-12 DIAGNOSIS — Z8521 Personal history of malignant neoplasm of larynx: Secondary | ICD-10-CM | POA: Diagnosis not present

## 2017-05-12 DIAGNOSIS — Z9002 Acquired absence of larynx: Secondary | ICD-10-CM | POA: Diagnosis not present

## 2017-05-12 DIAGNOSIS — Z448 Encounter for fitting and adjustment of other external prosthetic devices: Secondary | ICD-10-CM | POA: Diagnosis not present

## 2017-05-13 DIAGNOSIS — Z96659 Presence of unspecified artificial knee joint: Secondary | ICD-10-CM | POA: Diagnosis not present

## 2017-05-13 DIAGNOSIS — C323 Malignant neoplasm of laryngeal cartilage: Secondary | ICD-10-CM | POA: Diagnosis not present

## 2017-05-13 DIAGNOSIS — I10 Essential (primary) hypertension: Secondary | ICD-10-CM | POA: Diagnosis not present

## 2017-05-13 DIAGNOSIS — C329 Malignant neoplasm of larynx, unspecified: Secondary | ICD-10-CM | POA: Diagnosis not present

## 2017-05-13 DIAGNOSIS — D496 Neoplasm of unspecified behavior of brain: Secondary | ICD-10-CM | POA: Diagnosis not present

## 2017-05-13 DIAGNOSIS — Z9002 Acquired absence of larynx: Secondary | ICD-10-CM | POA: Diagnosis not present

## 2017-05-13 DIAGNOSIS — Z510 Encounter for antineoplastic radiation therapy: Secondary | ICD-10-CM | POA: Diagnosis not present

## 2017-05-13 DIAGNOSIS — Z803 Family history of malignant neoplasm of breast: Secondary | ICD-10-CM | POA: Diagnosis not present

## 2017-05-18 DIAGNOSIS — C323 Malignant neoplasm of laryngeal cartilage: Secondary | ICD-10-CM | POA: Diagnosis not present

## 2017-05-18 DIAGNOSIS — Z96659 Presence of unspecified artificial knee joint: Secondary | ICD-10-CM | POA: Diagnosis not present

## 2017-05-18 DIAGNOSIS — I10 Essential (primary) hypertension: Secondary | ICD-10-CM | POA: Diagnosis not present

## 2017-05-18 DIAGNOSIS — Z510 Encounter for antineoplastic radiation therapy: Secondary | ICD-10-CM | POA: Diagnosis not present

## 2017-05-18 DIAGNOSIS — D496 Neoplasm of unspecified behavior of brain: Secondary | ICD-10-CM | POA: Diagnosis not present

## 2017-05-18 DIAGNOSIS — C329 Malignant neoplasm of larynx, unspecified: Secondary | ICD-10-CM | POA: Diagnosis not present

## 2017-05-18 DIAGNOSIS — Z9002 Acquired absence of larynx: Secondary | ICD-10-CM | POA: Diagnosis not present

## 2017-05-19 DIAGNOSIS — I10 Essential (primary) hypertension: Secondary | ICD-10-CM | POA: Diagnosis not present

## 2017-05-19 DIAGNOSIS — C329 Malignant neoplasm of larynx, unspecified: Secondary | ICD-10-CM | POA: Diagnosis not present

## 2017-05-19 DIAGNOSIS — Z510 Encounter for antineoplastic radiation therapy: Secondary | ICD-10-CM | POA: Diagnosis not present

## 2017-05-19 DIAGNOSIS — Z9002 Acquired absence of larynx: Secondary | ICD-10-CM | POA: Diagnosis not present

## 2017-05-19 DIAGNOSIS — Z96659 Presence of unspecified artificial knee joint: Secondary | ICD-10-CM | POA: Diagnosis not present

## 2017-05-19 DIAGNOSIS — C323 Malignant neoplasm of laryngeal cartilage: Secondary | ICD-10-CM | POA: Diagnosis not present

## 2017-05-19 DIAGNOSIS — D496 Neoplasm of unspecified behavior of brain: Secondary | ICD-10-CM | POA: Diagnosis not present

## 2017-05-20 DIAGNOSIS — Z510 Encounter for antineoplastic radiation therapy: Secondary | ICD-10-CM | POA: Diagnosis not present

## 2017-05-20 DIAGNOSIS — C323 Malignant neoplasm of laryngeal cartilage: Secondary | ICD-10-CM | POA: Diagnosis not present

## 2017-05-20 DIAGNOSIS — Z96659 Presence of unspecified artificial knee joint: Secondary | ICD-10-CM | POA: Diagnosis not present

## 2017-05-20 DIAGNOSIS — Z9002 Acquired absence of larynx: Secondary | ICD-10-CM | POA: Diagnosis not present

## 2017-05-20 DIAGNOSIS — C329 Malignant neoplasm of larynx, unspecified: Secondary | ICD-10-CM | POA: Diagnosis not present

## 2017-05-20 DIAGNOSIS — D496 Neoplasm of unspecified behavior of brain: Secondary | ICD-10-CM | POA: Diagnosis not present

## 2017-05-20 DIAGNOSIS — I10 Essential (primary) hypertension: Secondary | ICD-10-CM | POA: Diagnosis not present

## 2017-05-21 DIAGNOSIS — Z510 Encounter for antineoplastic radiation therapy: Secondary | ICD-10-CM | POA: Diagnosis not present

## 2017-05-21 DIAGNOSIS — C323 Malignant neoplasm of laryngeal cartilage: Secondary | ICD-10-CM | POA: Diagnosis not present

## 2017-05-21 DIAGNOSIS — Z96659 Presence of unspecified artificial knee joint: Secondary | ICD-10-CM | POA: Diagnosis not present

## 2017-05-21 DIAGNOSIS — I10 Essential (primary) hypertension: Secondary | ICD-10-CM | POA: Diagnosis not present

## 2017-05-21 DIAGNOSIS — D496 Neoplasm of unspecified behavior of brain: Secondary | ICD-10-CM | POA: Diagnosis not present

## 2017-05-21 DIAGNOSIS — Z803 Family history of malignant neoplasm of breast: Secondary | ICD-10-CM | POA: Diagnosis not present

## 2017-05-21 DIAGNOSIS — Z9002 Acquired absence of larynx: Secondary | ICD-10-CM | POA: Diagnosis not present

## 2017-05-21 DIAGNOSIS — C329 Malignant neoplasm of larynx, unspecified: Secondary | ICD-10-CM | POA: Diagnosis not present

## 2017-05-22 DIAGNOSIS — Z96659 Presence of unspecified artificial knee joint: Secondary | ICD-10-CM | POA: Diagnosis not present

## 2017-05-22 DIAGNOSIS — I10 Essential (primary) hypertension: Secondary | ICD-10-CM | POA: Diagnosis not present

## 2017-05-22 DIAGNOSIS — C329 Malignant neoplasm of larynx, unspecified: Secondary | ICD-10-CM | POA: Diagnosis not present

## 2017-05-22 DIAGNOSIS — Z9002 Acquired absence of larynx: Secondary | ICD-10-CM | POA: Diagnosis not present

## 2017-05-22 DIAGNOSIS — C323 Malignant neoplasm of laryngeal cartilage: Secondary | ICD-10-CM | POA: Diagnosis not present

## 2017-05-22 DIAGNOSIS — D496 Neoplasm of unspecified behavior of brain: Secondary | ICD-10-CM | POA: Diagnosis not present

## 2017-05-22 DIAGNOSIS — Z510 Encounter for antineoplastic radiation therapy: Secondary | ICD-10-CM | POA: Diagnosis not present

## 2017-05-25 DIAGNOSIS — D496 Neoplasm of unspecified behavior of brain: Secondary | ICD-10-CM | POA: Diagnosis not present

## 2017-05-25 DIAGNOSIS — I10 Essential (primary) hypertension: Secondary | ICD-10-CM | POA: Diagnosis not present

## 2017-05-25 DIAGNOSIS — C329 Malignant neoplasm of larynx, unspecified: Secondary | ICD-10-CM | POA: Diagnosis not present

## 2017-05-25 DIAGNOSIS — Z96659 Presence of unspecified artificial knee joint: Secondary | ICD-10-CM | POA: Diagnosis not present

## 2017-05-25 DIAGNOSIS — Z510 Encounter for antineoplastic radiation therapy: Secondary | ICD-10-CM | POA: Diagnosis not present

## 2017-05-25 DIAGNOSIS — C323 Malignant neoplasm of laryngeal cartilage: Secondary | ICD-10-CM | POA: Diagnosis not present

## 2017-05-25 DIAGNOSIS — Z9002 Acquired absence of larynx: Secondary | ICD-10-CM | POA: Diagnosis not present

## 2017-05-26 DIAGNOSIS — Z96659 Presence of unspecified artificial knee joint: Secondary | ICD-10-CM | POA: Diagnosis not present

## 2017-05-26 DIAGNOSIS — I10 Essential (primary) hypertension: Secondary | ICD-10-CM | POA: Diagnosis not present

## 2017-05-26 DIAGNOSIS — D496 Neoplasm of unspecified behavior of brain: Secondary | ICD-10-CM | POA: Diagnosis not present

## 2017-05-26 DIAGNOSIS — Z510 Encounter for antineoplastic radiation therapy: Secondary | ICD-10-CM | POA: Diagnosis not present

## 2017-05-26 DIAGNOSIS — Z9002 Acquired absence of larynx: Secondary | ICD-10-CM | POA: Diagnosis not present

## 2017-05-26 DIAGNOSIS — C329 Malignant neoplasm of larynx, unspecified: Secondary | ICD-10-CM | POA: Diagnosis not present

## 2017-05-26 DIAGNOSIS — C323 Malignant neoplasm of laryngeal cartilage: Secondary | ICD-10-CM | POA: Diagnosis not present

## 2017-05-27 DIAGNOSIS — Z9002 Acquired absence of larynx: Secondary | ICD-10-CM | POA: Diagnosis not present

## 2017-05-27 DIAGNOSIS — D496 Neoplasm of unspecified behavior of brain: Secondary | ICD-10-CM | POA: Diagnosis not present

## 2017-05-27 DIAGNOSIS — Z510 Encounter for antineoplastic radiation therapy: Secondary | ICD-10-CM | POA: Diagnosis not present

## 2017-05-27 DIAGNOSIS — C323 Malignant neoplasm of laryngeal cartilage: Secondary | ICD-10-CM | POA: Diagnosis not present

## 2017-05-27 DIAGNOSIS — C329 Malignant neoplasm of larynx, unspecified: Secondary | ICD-10-CM | POA: Diagnosis not present

## 2017-05-27 DIAGNOSIS — Z96659 Presence of unspecified artificial knee joint: Secondary | ICD-10-CM | POA: Diagnosis not present

## 2017-05-27 DIAGNOSIS — I10 Essential (primary) hypertension: Secondary | ICD-10-CM | POA: Diagnosis not present

## 2017-05-28 DIAGNOSIS — Z96659 Presence of unspecified artificial knee joint: Secondary | ICD-10-CM | POA: Diagnosis not present

## 2017-05-28 DIAGNOSIS — Z510 Encounter for antineoplastic radiation therapy: Secondary | ICD-10-CM | POA: Diagnosis not present

## 2017-05-28 DIAGNOSIS — Z9002 Acquired absence of larynx: Secondary | ICD-10-CM | POA: Diagnosis not present

## 2017-05-28 DIAGNOSIS — I10 Essential (primary) hypertension: Secondary | ICD-10-CM | POA: Diagnosis not present

## 2017-05-28 DIAGNOSIS — C329 Malignant neoplasm of larynx, unspecified: Secondary | ICD-10-CM | POA: Diagnosis not present

## 2017-05-28 DIAGNOSIS — C323 Malignant neoplasm of laryngeal cartilage: Secondary | ICD-10-CM | POA: Diagnosis not present

## 2017-05-28 DIAGNOSIS — D496 Neoplasm of unspecified behavior of brain: Secondary | ICD-10-CM | POA: Diagnosis not present

## 2017-05-29 DIAGNOSIS — I10 Essential (primary) hypertension: Secondary | ICD-10-CM | POA: Diagnosis not present

## 2017-05-29 DIAGNOSIS — Z510 Encounter for antineoplastic radiation therapy: Secondary | ICD-10-CM | POA: Diagnosis not present

## 2017-05-29 DIAGNOSIS — Z9002 Acquired absence of larynx: Secondary | ICD-10-CM | POA: Diagnosis not present

## 2017-05-29 DIAGNOSIS — Z96659 Presence of unspecified artificial knee joint: Secondary | ICD-10-CM | POA: Diagnosis not present

## 2017-05-29 DIAGNOSIS — C323 Malignant neoplasm of laryngeal cartilage: Secondary | ICD-10-CM | POA: Diagnosis not present

## 2017-05-29 DIAGNOSIS — D496 Neoplasm of unspecified behavior of brain: Secondary | ICD-10-CM | POA: Diagnosis not present

## 2017-05-29 DIAGNOSIS — C329 Malignant neoplasm of larynx, unspecified: Secondary | ICD-10-CM | POA: Diagnosis not present

## 2017-06-01 DIAGNOSIS — Z9002 Acquired absence of larynx: Secondary | ICD-10-CM | POA: Diagnosis not present

## 2017-06-01 DIAGNOSIS — C329 Malignant neoplasm of larynx, unspecified: Secondary | ICD-10-CM | POA: Diagnosis not present

## 2017-06-01 DIAGNOSIS — D496 Neoplasm of unspecified behavior of brain: Secondary | ICD-10-CM | POA: Diagnosis not present

## 2017-06-01 DIAGNOSIS — Z96659 Presence of unspecified artificial knee joint: Secondary | ICD-10-CM | POA: Diagnosis not present

## 2017-06-01 DIAGNOSIS — C323 Malignant neoplasm of laryngeal cartilage: Secondary | ICD-10-CM | POA: Diagnosis not present

## 2017-06-01 DIAGNOSIS — Z510 Encounter for antineoplastic radiation therapy: Secondary | ICD-10-CM | POA: Diagnosis not present

## 2017-06-01 DIAGNOSIS — I10 Essential (primary) hypertension: Secondary | ICD-10-CM | POA: Diagnosis not present

## 2017-06-02 DIAGNOSIS — I10 Essential (primary) hypertension: Secondary | ICD-10-CM | POA: Diagnosis not present

## 2017-06-02 DIAGNOSIS — C323 Malignant neoplasm of laryngeal cartilage: Secondary | ICD-10-CM | POA: Diagnosis not present

## 2017-06-02 DIAGNOSIS — Z510 Encounter for antineoplastic radiation therapy: Secondary | ICD-10-CM | POA: Diagnosis not present

## 2017-06-02 DIAGNOSIS — Z96659 Presence of unspecified artificial knee joint: Secondary | ICD-10-CM | POA: Diagnosis not present

## 2017-06-02 DIAGNOSIS — C329 Malignant neoplasm of larynx, unspecified: Secondary | ICD-10-CM | POA: Diagnosis not present

## 2017-06-02 DIAGNOSIS — D496 Neoplasm of unspecified behavior of brain: Secondary | ICD-10-CM | POA: Diagnosis not present

## 2017-06-02 DIAGNOSIS — Z9002 Acquired absence of larynx: Secondary | ICD-10-CM | POA: Diagnosis not present

## 2017-06-03 DIAGNOSIS — Z96659 Presence of unspecified artificial knee joint: Secondary | ICD-10-CM | POA: Diagnosis not present

## 2017-06-03 DIAGNOSIS — I10 Essential (primary) hypertension: Secondary | ICD-10-CM | POA: Diagnosis not present

## 2017-06-03 DIAGNOSIS — D496 Neoplasm of unspecified behavior of brain: Secondary | ICD-10-CM | POA: Diagnosis not present

## 2017-06-03 DIAGNOSIS — Z9002 Acquired absence of larynx: Secondary | ICD-10-CM | POA: Diagnosis not present

## 2017-06-03 DIAGNOSIS — Z510 Encounter for antineoplastic radiation therapy: Secondary | ICD-10-CM | POA: Diagnosis not present

## 2017-06-03 DIAGNOSIS — C323 Malignant neoplasm of laryngeal cartilage: Secondary | ICD-10-CM | POA: Diagnosis not present

## 2017-06-03 DIAGNOSIS — C329 Malignant neoplasm of larynx, unspecified: Secondary | ICD-10-CM | POA: Diagnosis not present

## 2017-06-04 DIAGNOSIS — C329 Malignant neoplasm of larynx, unspecified: Secondary | ICD-10-CM | POA: Diagnosis not present

## 2017-06-04 DIAGNOSIS — D496 Neoplasm of unspecified behavior of brain: Secondary | ICD-10-CM | POA: Diagnosis not present

## 2017-06-04 DIAGNOSIS — I10 Essential (primary) hypertension: Secondary | ICD-10-CM | POA: Diagnosis not present

## 2017-06-04 DIAGNOSIS — Z96659 Presence of unspecified artificial knee joint: Secondary | ICD-10-CM | POA: Diagnosis not present

## 2017-06-04 DIAGNOSIS — Z9002 Acquired absence of larynx: Secondary | ICD-10-CM | POA: Diagnosis not present

## 2017-06-04 DIAGNOSIS — C323 Malignant neoplasm of laryngeal cartilage: Secondary | ICD-10-CM | POA: Diagnosis not present

## 2017-06-04 DIAGNOSIS — Z510 Encounter for antineoplastic radiation therapy: Secondary | ICD-10-CM | POA: Diagnosis not present

## 2017-06-05 DIAGNOSIS — Z96651 Presence of right artificial knee joint: Secondary | ICD-10-CM | POA: Diagnosis not present

## 2017-06-05 DIAGNOSIS — C329 Malignant neoplasm of larynx, unspecified: Secondary | ICD-10-CM | POA: Diagnosis not present

## 2017-06-05 DIAGNOSIS — H9190 Unspecified hearing loss, unspecified ear: Secondary | ICD-10-CM | POA: Diagnosis not present

## 2017-06-05 DIAGNOSIS — Z510 Encounter for antineoplastic radiation therapy: Secondary | ICD-10-CM | POA: Diagnosis not present

## 2017-06-05 DIAGNOSIS — Z79899 Other long term (current) drug therapy: Secondary | ICD-10-CM | POA: Diagnosis not present

## 2017-06-05 DIAGNOSIS — Z7982 Long term (current) use of aspirin: Secondary | ICD-10-CM | POA: Diagnosis not present

## 2017-06-05 DIAGNOSIS — I10 Essential (primary) hypertension: Secondary | ICD-10-CM | POA: Diagnosis not present

## 2017-06-05 DIAGNOSIS — Z448 Encounter for fitting and adjustment of other external prosthetic devices: Secondary | ICD-10-CM | POA: Diagnosis not present

## 2017-06-05 DIAGNOSIS — Z803 Family history of malignant neoplasm of breast: Secondary | ICD-10-CM | POA: Diagnosis not present

## 2017-06-05 DIAGNOSIS — D496 Neoplasm of unspecified behavior of brain: Secondary | ICD-10-CM | POA: Diagnosis not present

## 2017-06-05 DIAGNOSIS — Z792 Long term (current) use of antibiotics: Secondary | ICD-10-CM | POA: Diagnosis not present

## 2017-06-05 DIAGNOSIS — Z8521 Personal history of malignant neoplasm of larynx: Secondary | ICD-10-CM | POA: Diagnosis not present

## 2017-06-05 DIAGNOSIS — Z96659 Presence of unspecified artificial knee joint: Secondary | ICD-10-CM | POA: Diagnosis not present

## 2017-06-05 DIAGNOSIS — Z9002 Acquired absence of larynx: Secondary | ICD-10-CM | POA: Diagnosis not present

## 2017-06-05 DIAGNOSIS — H269 Unspecified cataract: Secondary | ICD-10-CM | POA: Diagnosis not present

## 2017-06-05 DIAGNOSIS — C323 Malignant neoplasm of laryngeal cartilage: Secondary | ICD-10-CM | POA: Diagnosis not present

## 2017-06-05 DIAGNOSIS — Z930 Tracheostomy status: Secondary | ICD-10-CM | POA: Diagnosis not present

## 2017-06-05 DIAGNOSIS — Z9089 Acquired absence of other organs: Secondary | ICD-10-CM | POA: Diagnosis not present

## 2017-06-08 DIAGNOSIS — Z510 Encounter for antineoplastic radiation therapy: Secondary | ICD-10-CM | POA: Diagnosis not present

## 2017-06-08 DIAGNOSIS — C329 Malignant neoplasm of larynx, unspecified: Secondary | ICD-10-CM | POA: Diagnosis not present

## 2017-06-08 DIAGNOSIS — Z9002 Acquired absence of larynx: Secondary | ICD-10-CM | POA: Diagnosis not present

## 2017-06-08 DIAGNOSIS — I10 Essential (primary) hypertension: Secondary | ICD-10-CM | POA: Diagnosis not present

## 2017-06-08 DIAGNOSIS — C323 Malignant neoplasm of laryngeal cartilage: Secondary | ICD-10-CM | POA: Diagnosis not present

## 2017-06-08 DIAGNOSIS — Z803 Family history of malignant neoplasm of breast: Secondary | ICD-10-CM | POA: Diagnosis not present

## 2017-06-08 DIAGNOSIS — D496 Neoplasm of unspecified behavior of brain: Secondary | ICD-10-CM | POA: Diagnosis not present

## 2017-06-08 DIAGNOSIS — Z96659 Presence of unspecified artificial knee joint: Secondary | ICD-10-CM | POA: Diagnosis not present

## 2017-06-09 DIAGNOSIS — I10 Essential (primary) hypertension: Secondary | ICD-10-CM | POA: Diagnosis not present

## 2017-06-09 DIAGNOSIS — Z9002 Acquired absence of larynx: Secondary | ICD-10-CM | POA: Diagnosis not present

## 2017-06-09 DIAGNOSIS — C323 Malignant neoplasm of laryngeal cartilage: Secondary | ICD-10-CM | POA: Diagnosis not present

## 2017-06-09 DIAGNOSIS — Z510 Encounter for antineoplastic radiation therapy: Secondary | ICD-10-CM | POA: Diagnosis not present

## 2017-06-09 DIAGNOSIS — C329 Malignant neoplasm of larynx, unspecified: Secondary | ICD-10-CM | POA: Diagnosis not present

## 2017-06-09 DIAGNOSIS — Z96659 Presence of unspecified artificial knee joint: Secondary | ICD-10-CM | POA: Diagnosis not present

## 2017-06-09 DIAGNOSIS — D496 Neoplasm of unspecified behavior of brain: Secondary | ICD-10-CM | POA: Diagnosis not present

## 2017-06-10 DIAGNOSIS — Z96659 Presence of unspecified artificial knee joint: Secondary | ICD-10-CM | POA: Diagnosis not present

## 2017-06-10 DIAGNOSIS — C329 Malignant neoplasm of larynx, unspecified: Secondary | ICD-10-CM | POA: Diagnosis not present

## 2017-06-10 DIAGNOSIS — Z9002 Acquired absence of larynx: Secondary | ICD-10-CM | POA: Diagnosis not present

## 2017-06-10 DIAGNOSIS — I10 Essential (primary) hypertension: Secondary | ICD-10-CM | POA: Diagnosis not present

## 2017-06-10 DIAGNOSIS — C323 Malignant neoplasm of laryngeal cartilage: Secondary | ICD-10-CM | POA: Diagnosis not present

## 2017-06-10 DIAGNOSIS — D496 Neoplasm of unspecified behavior of brain: Secondary | ICD-10-CM | POA: Diagnosis not present

## 2017-06-10 DIAGNOSIS — Z510 Encounter for antineoplastic radiation therapy: Secondary | ICD-10-CM | POA: Diagnosis not present

## 2017-06-15 DIAGNOSIS — I10 Essential (primary) hypertension: Secondary | ICD-10-CM | POA: Diagnosis not present

## 2017-06-15 DIAGNOSIS — C329 Malignant neoplasm of larynx, unspecified: Secondary | ICD-10-CM | POA: Diagnosis not present

## 2017-06-15 DIAGNOSIS — Z96659 Presence of unspecified artificial knee joint: Secondary | ICD-10-CM | POA: Diagnosis not present

## 2017-06-15 DIAGNOSIS — D496 Neoplasm of unspecified behavior of brain: Secondary | ICD-10-CM | POA: Diagnosis not present

## 2017-06-15 DIAGNOSIS — Z9002 Acquired absence of larynx: Secondary | ICD-10-CM | POA: Diagnosis not present

## 2017-06-15 DIAGNOSIS — Z510 Encounter for antineoplastic radiation therapy: Secondary | ICD-10-CM | POA: Diagnosis not present

## 2017-06-15 DIAGNOSIS — C323 Malignant neoplasm of laryngeal cartilage: Secondary | ICD-10-CM | POA: Diagnosis not present

## 2017-06-16 DIAGNOSIS — Z96659 Presence of unspecified artificial knee joint: Secondary | ICD-10-CM | POA: Diagnosis not present

## 2017-06-16 DIAGNOSIS — I10 Essential (primary) hypertension: Secondary | ICD-10-CM | POA: Diagnosis not present

## 2017-06-16 DIAGNOSIS — Z510 Encounter for antineoplastic radiation therapy: Secondary | ICD-10-CM | POA: Diagnosis not present

## 2017-06-16 DIAGNOSIS — C323 Malignant neoplasm of laryngeal cartilage: Secondary | ICD-10-CM | POA: Diagnosis not present

## 2017-06-16 DIAGNOSIS — Z9002 Acquired absence of larynx: Secondary | ICD-10-CM | POA: Diagnosis not present

## 2017-06-16 DIAGNOSIS — D496 Neoplasm of unspecified behavior of brain: Secondary | ICD-10-CM | POA: Diagnosis not present

## 2017-06-16 DIAGNOSIS — C329 Malignant neoplasm of larynx, unspecified: Secondary | ICD-10-CM | POA: Diagnosis not present

## 2017-06-17 DIAGNOSIS — D496 Neoplasm of unspecified behavior of brain: Secondary | ICD-10-CM | POA: Diagnosis not present

## 2017-06-17 DIAGNOSIS — Z9002 Acquired absence of larynx: Secondary | ICD-10-CM | POA: Diagnosis not present

## 2017-06-17 DIAGNOSIS — C323 Malignant neoplasm of laryngeal cartilage: Secondary | ICD-10-CM | POA: Diagnosis not present

## 2017-06-17 DIAGNOSIS — Z96659 Presence of unspecified artificial knee joint: Secondary | ICD-10-CM | POA: Diagnosis not present

## 2017-06-17 DIAGNOSIS — I10 Essential (primary) hypertension: Secondary | ICD-10-CM | POA: Diagnosis not present

## 2017-06-17 DIAGNOSIS — Z510 Encounter for antineoplastic radiation therapy: Secondary | ICD-10-CM | POA: Diagnosis not present

## 2017-06-17 DIAGNOSIS — C329 Malignant neoplasm of larynx, unspecified: Secondary | ICD-10-CM | POA: Diagnosis not present

## 2017-06-18 DIAGNOSIS — Z9002 Acquired absence of larynx: Secondary | ICD-10-CM | POA: Diagnosis not present

## 2017-06-18 DIAGNOSIS — C323 Malignant neoplasm of laryngeal cartilage: Secondary | ICD-10-CM | POA: Diagnosis not present

## 2017-06-18 DIAGNOSIS — Z96659 Presence of unspecified artificial knee joint: Secondary | ICD-10-CM | POA: Diagnosis not present

## 2017-06-18 DIAGNOSIS — I10 Essential (primary) hypertension: Secondary | ICD-10-CM | POA: Diagnosis not present

## 2017-06-18 DIAGNOSIS — D496 Neoplasm of unspecified behavior of brain: Secondary | ICD-10-CM | POA: Diagnosis not present

## 2017-06-18 DIAGNOSIS — C329 Malignant neoplasm of larynx, unspecified: Secondary | ICD-10-CM | POA: Diagnosis not present

## 2017-06-18 DIAGNOSIS — Z510 Encounter for antineoplastic radiation therapy: Secondary | ICD-10-CM | POA: Diagnosis not present

## 2017-06-19 DIAGNOSIS — Z96659 Presence of unspecified artificial knee joint: Secondary | ICD-10-CM | POA: Diagnosis not present

## 2017-06-19 DIAGNOSIS — I10 Essential (primary) hypertension: Secondary | ICD-10-CM | POA: Diagnosis not present

## 2017-06-19 DIAGNOSIS — Z9002 Acquired absence of larynx: Secondary | ICD-10-CM | POA: Diagnosis not present

## 2017-06-19 DIAGNOSIS — C323 Malignant neoplasm of laryngeal cartilage: Secondary | ICD-10-CM | POA: Diagnosis not present

## 2017-06-19 DIAGNOSIS — Z510 Encounter for antineoplastic radiation therapy: Secondary | ICD-10-CM | POA: Diagnosis not present

## 2017-06-19 DIAGNOSIS — C329 Malignant neoplasm of larynx, unspecified: Secondary | ICD-10-CM | POA: Diagnosis not present

## 2017-06-19 DIAGNOSIS — D496 Neoplasm of unspecified behavior of brain: Secondary | ICD-10-CM | POA: Diagnosis not present

## 2017-06-22 DIAGNOSIS — Z510 Encounter for antineoplastic radiation therapy: Secondary | ICD-10-CM | POA: Diagnosis not present

## 2017-06-22 DIAGNOSIS — C329 Malignant neoplasm of larynx, unspecified: Secondary | ICD-10-CM | POA: Diagnosis not present

## 2017-06-23 DIAGNOSIS — C329 Malignant neoplasm of larynx, unspecified: Secondary | ICD-10-CM | POA: Diagnosis not present

## 2017-06-23 DIAGNOSIS — Z510 Encounter for antineoplastic radiation therapy: Secondary | ICD-10-CM | POA: Diagnosis not present

## 2017-06-24 DIAGNOSIS — C329 Malignant neoplasm of larynx, unspecified: Secondary | ICD-10-CM | POA: Diagnosis not present

## 2017-06-24 DIAGNOSIS — Z510 Encounter for antineoplastic radiation therapy: Secondary | ICD-10-CM | POA: Diagnosis not present

## 2017-06-25 DIAGNOSIS — Z510 Encounter for antineoplastic radiation therapy: Secondary | ICD-10-CM | POA: Diagnosis not present

## 2017-06-25 DIAGNOSIS — C329 Malignant neoplasm of larynx, unspecified: Secondary | ICD-10-CM | POA: Diagnosis not present

## 2017-06-26 DIAGNOSIS — C329 Malignant neoplasm of larynx, unspecified: Secondary | ICD-10-CM | POA: Diagnosis not present

## 2017-06-26 DIAGNOSIS — Z510 Encounter for antineoplastic radiation therapy: Secondary | ICD-10-CM | POA: Diagnosis not present

## 2017-06-29 DIAGNOSIS — C329 Malignant neoplasm of larynx, unspecified: Secondary | ICD-10-CM | POA: Diagnosis not present

## 2017-06-29 DIAGNOSIS — Z510 Encounter for antineoplastic radiation therapy: Secondary | ICD-10-CM | POA: Diagnosis not present

## 2017-06-30 DIAGNOSIS — Z510 Encounter for antineoplastic radiation therapy: Secondary | ICD-10-CM | POA: Diagnosis not present

## 2017-06-30 DIAGNOSIS — C329 Malignant neoplasm of larynx, unspecified: Secondary | ICD-10-CM | POA: Diagnosis not present

## 2017-07-06 DIAGNOSIS — Z8521 Personal history of malignant neoplasm of larynx: Secondary | ICD-10-CM | POA: Diagnosis not present

## 2017-07-06 DIAGNOSIS — Z7982 Long term (current) use of aspirin: Secondary | ICD-10-CM | POA: Diagnosis not present

## 2017-07-06 DIAGNOSIS — Z9002 Acquired absence of larynx: Secondary | ICD-10-CM | POA: Diagnosis not present

## 2017-07-06 DIAGNOSIS — Z448 Encounter for fitting and adjustment of other external prosthetic devices: Secondary | ICD-10-CM | POA: Diagnosis not present

## 2017-07-06 DIAGNOSIS — I10 Essential (primary) hypertension: Secondary | ICD-10-CM | POA: Diagnosis not present

## 2017-07-06 DIAGNOSIS — Z79899 Other long term (current) drug therapy: Secondary | ICD-10-CM | POA: Diagnosis not present

## 2017-08-11 DIAGNOSIS — R491 Aphonia: Secondary | ICD-10-CM | POA: Diagnosis not present

## 2017-08-11 DIAGNOSIS — Z9002 Acquired absence of larynx: Secondary | ICD-10-CM | POA: Diagnosis not present

## 2017-08-11 DIAGNOSIS — Z8521 Personal history of malignant neoplasm of larynx: Secondary | ICD-10-CM | POA: Diagnosis not present

## 2017-08-11 DIAGNOSIS — Z963 Presence of artificial larynx: Secondary | ICD-10-CM | POA: Diagnosis not present

## 2017-08-11 DIAGNOSIS — T85638A Leakage of other specified internal prosthetic devices, implants and grafts, initial encounter: Secondary | ICD-10-CM | POA: Diagnosis not present

## 2017-08-11 DIAGNOSIS — C329 Malignant neoplasm of larynx, unspecified: Secondary | ICD-10-CM | POA: Diagnosis not present

## 2017-08-18 DIAGNOSIS — Z430 Encounter for attention to tracheostomy: Secondary | ICD-10-CM | POA: Diagnosis not present

## 2017-08-18 DIAGNOSIS — Z9002 Acquired absence of larynx: Secondary | ICD-10-CM | POA: Diagnosis not present

## 2017-09-01 DIAGNOSIS — H353131 Nonexudative age-related macular degeneration, bilateral, early dry stage: Secondary | ICD-10-CM | POA: Diagnosis not present

## 2017-09-03 DIAGNOSIS — T85638A Leakage of other specified internal prosthetic devices, implants and grafts, initial encounter: Secondary | ICD-10-CM | POA: Diagnosis not present

## 2017-09-03 DIAGNOSIS — Z79899 Other long term (current) drug therapy: Secondary | ICD-10-CM | POA: Diagnosis not present

## 2017-09-03 DIAGNOSIS — Z91048 Other nonmedicinal substance allergy status: Secondary | ICD-10-CM | POA: Diagnosis not present

## 2017-09-03 DIAGNOSIS — Z9002 Acquired absence of larynx: Secondary | ICD-10-CM | POA: Diagnosis not present

## 2017-09-03 DIAGNOSIS — C329 Malignant neoplasm of larynx, unspecified: Secondary | ICD-10-CM | POA: Diagnosis not present

## 2017-09-03 DIAGNOSIS — Z7982 Long term (current) use of aspirin: Secondary | ICD-10-CM | POA: Diagnosis not present

## 2017-09-03 DIAGNOSIS — I10 Essential (primary) hypertension: Secondary | ICD-10-CM | POA: Diagnosis not present

## 2017-09-23 DIAGNOSIS — I890 Lymphedema, not elsewhere classified: Secondary | ICD-10-CM | POA: Diagnosis not present

## 2017-09-29 DIAGNOSIS — T85638A Leakage of other specified internal prosthetic devices, implants and grafts, initial encounter: Secondary | ICD-10-CM | POA: Diagnosis not present

## 2017-09-29 DIAGNOSIS — C329 Malignant neoplasm of larynx, unspecified: Secondary | ICD-10-CM | POA: Diagnosis not present

## 2017-09-29 DIAGNOSIS — H9190 Unspecified hearing loss, unspecified ear: Secondary | ICD-10-CM | POA: Diagnosis not present

## 2017-09-29 DIAGNOSIS — I10 Essential (primary) hypertension: Secondary | ICD-10-CM | POA: Diagnosis not present

## 2017-10-05 DIAGNOSIS — I890 Lymphedema, not elsewhere classified: Secondary | ICD-10-CM | POA: Diagnosis not present

## 2017-10-09 DIAGNOSIS — I890 Lymphedema, not elsewhere classified: Secondary | ICD-10-CM | POA: Diagnosis not present

## 2017-10-12 DIAGNOSIS — I890 Lymphedema, not elsewhere classified: Secondary | ICD-10-CM | POA: Diagnosis not present

## 2017-10-13 DIAGNOSIS — T85638A Leakage of other specified internal prosthetic devices, implants and grafts, initial encounter: Secondary | ICD-10-CM | POA: Diagnosis not present

## 2017-10-13 DIAGNOSIS — C329 Malignant neoplasm of larynx, unspecified: Secondary | ICD-10-CM | POA: Diagnosis not present

## 2017-10-13 DIAGNOSIS — Z9002 Acquired absence of larynx: Secondary | ICD-10-CM | POA: Diagnosis not present

## 2017-10-16 DIAGNOSIS — I890 Lymphedema, not elsewhere classified: Secondary | ICD-10-CM | POA: Diagnosis not present

## 2017-10-19 DIAGNOSIS — C329 Malignant neoplasm of larynx, unspecified: Secondary | ICD-10-CM | POA: Diagnosis not present

## 2017-10-19 DIAGNOSIS — Z9002 Acquired absence of larynx: Secondary | ICD-10-CM | POA: Diagnosis not present

## 2017-10-19 DIAGNOSIS — R918 Other nonspecific abnormal finding of lung field: Secondary | ICD-10-CM | POA: Diagnosis not present

## 2017-10-21 DIAGNOSIS — Z448 Encounter for fitting and adjustment of other external prosthetic devices: Secondary | ICD-10-CM | POA: Diagnosis not present

## 2017-10-21 DIAGNOSIS — M62838 Other muscle spasm: Secondary | ICD-10-CM | POA: Diagnosis not present

## 2017-10-21 DIAGNOSIS — Z9002 Acquired absence of larynx: Secondary | ICD-10-CM | POA: Diagnosis not present

## 2017-10-21 DIAGNOSIS — J392 Other diseases of pharynx: Secondary | ICD-10-CM | POA: Diagnosis not present

## 2017-10-21 DIAGNOSIS — J382 Nodules of vocal cords: Secondary | ICD-10-CM | POA: Diagnosis not present

## 2017-10-21 DIAGNOSIS — C329 Malignant neoplasm of larynx, unspecified: Secondary | ICD-10-CM | POA: Diagnosis not present

## 2017-10-23 DIAGNOSIS — I890 Lymphedema, not elsewhere classified: Secondary | ICD-10-CM | POA: Diagnosis not present

## 2017-10-27 DIAGNOSIS — C329 Malignant neoplasm of larynx, unspecified: Secondary | ICD-10-CM | POA: Diagnosis not present

## 2017-10-27 DIAGNOSIS — Z9002 Acquired absence of larynx: Secondary | ICD-10-CM | POA: Diagnosis not present

## 2017-10-27 DIAGNOSIS — R633 Feeding difficulties: Secondary | ICD-10-CM | POA: Diagnosis not present

## 2017-10-27 DIAGNOSIS — R1313 Dysphagia, pharyngeal phase: Secondary | ICD-10-CM | POA: Diagnosis not present

## 2017-10-27 DIAGNOSIS — R1310 Dysphagia, unspecified: Secondary | ICD-10-CM | POA: Diagnosis not present

## 2017-10-27 DIAGNOSIS — R491 Aphonia: Secondary | ICD-10-CM | POA: Diagnosis not present

## 2017-10-28 DIAGNOSIS — C329 Malignant neoplasm of larynx, unspecified: Secondary | ICD-10-CM | POA: Diagnosis not present

## 2017-11-02 DIAGNOSIS — I890 Lymphedema, not elsewhere classified: Secondary | ICD-10-CM | POA: Diagnosis not present

## 2017-11-06 DIAGNOSIS — I890 Lymphedema, not elsewhere classified: Secondary | ICD-10-CM | POA: Diagnosis not present

## 2017-11-16 DIAGNOSIS — Z9002 Acquired absence of larynx: Secondary | ICD-10-CM | POA: Diagnosis not present

## 2017-11-16 DIAGNOSIS — R1313 Dysphagia, pharyngeal phase: Secondary | ICD-10-CM | POA: Diagnosis not present

## 2017-11-16 DIAGNOSIS — Z972 Presence of dental prosthetic device (complete) (partial): Secondary | ICD-10-CM | POA: Diagnosis not present

## 2017-11-16 DIAGNOSIS — Z930 Tracheostomy status: Secondary | ICD-10-CM | POA: Diagnosis not present

## 2017-11-16 DIAGNOSIS — I4891 Unspecified atrial fibrillation: Secondary | ICD-10-CM | POA: Diagnosis not present

## 2017-11-16 DIAGNOSIS — C329 Malignant neoplasm of larynx, unspecified: Secondary | ICD-10-CM | POA: Diagnosis not present

## 2017-11-16 DIAGNOSIS — I700 Atherosclerosis of aorta: Secondary | ICD-10-CM | POA: Diagnosis not present

## 2017-11-16 DIAGNOSIS — J8410 Pulmonary fibrosis, unspecified: Secondary | ICD-10-CM | POA: Diagnosis not present

## 2017-11-16 DIAGNOSIS — R9431 Abnormal electrocardiogram [ECG] [EKG]: Secondary | ICD-10-CM | POA: Diagnosis not present

## 2017-11-16 DIAGNOSIS — I10 Essential (primary) hypertension: Secondary | ICD-10-CM | POA: Diagnosis not present

## 2017-11-16 DIAGNOSIS — Z923 Personal history of irradiation: Secondary | ICD-10-CM | POA: Diagnosis not present

## 2017-11-16 DIAGNOSIS — K222 Esophageal obstruction: Secondary | ICD-10-CM | POA: Diagnosis not present

## 2017-11-16 DIAGNOSIS — K219 Gastro-esophageal reflux disease without esophagitis: Secondary | ICD-10-CM | POA: Diagnosis not present

## 2017-11-16 DIAGNOSIS — Z8521 Personal history of malignant neoplasm of larynx: Secondary | ICD-10-CM | POA: Diagnosis not present

## 2017-11-16 DIAGNOSIS — R491 Aphonia: Secondary | ICD-10-CM | POA: Diagnosis not present

## 2017-12-01 DIAGNOSIS — R58 Hemorrhage, not elsewhere classified: Secondary | ICD-10-CM | POA: Diagnosis not present

## 2017-12-01 DIAGNOSIS — R633 Feeding difficulties: Secondary | ICD-10-CM | POA: Diagnosis not present

## 2017-12-01 DIAGNOSIS — Z9002 Acquired absence of larynx: Secondary | ICD-10-CM | POA: Diagnosis not present

## 2017-12-01 DIAGNOSIS — R1313 Dysphagia, pharyngeal phase: Secondary | ICD-10-CM | POA: Diagnosis not present

## 2018-02-04 DIAGNOSIS — T85638A Leakage of other specified internal prosthetic devices, implants and grafts, initial encounter: Secondary | ICD-10-CM | POA: Diagnosis not present

## 2018-02-04 DIAGNOSIS — Z79899 Other long term (current) drug therapy: Secondary | ICD-10-CM | POA: Diagnosis not present

## 2018-02-04 DIAGNOSIS — I10 Essential (primary) hypertension: Secondary | ICD-10-CM | POA: Diagnosis not present

## 2018-02-04 DIAGNOSIS — Z91048 Other nonmedicinal substance allergy status: Secondary | ICD-10-CM | POA: Diagnosis not present

## 2018-02-04 DIAGNOSIS — Z8521 Personal history of malignant neoplasm of larynx: Secondary | ICD-10-CM | POA: Diagnosis not present

## 2018-02-04 DIAGNOSIS — Z7982 Long term (current) use of aspirin: Secondary | ICD-10-CM | POA: Diagnosis not present

## 2018-02-04 DIAGNOSIS — Z9002 Acquired absence of larynx: Secondary | ICD-10-CM | POA: Diagnosis not present

## 2018-03-24 DIAGNOSIS — Z448 Encounter for fitting and adjustment of other external prosthetic devices: Secondary | ICD-10-CM | POA: Diagnosis not present

## 2018-03-24 DIAGNOSIS — Z8521 Personal history of malignant neoplasm of larynx: Secondary | ICD-10-CM | POA: Diagnosis not present

## 2018-03-30 DIAGNOSIS — K449 Diaphragmatic hernia without obstruction or gangrene: Secondary | ICD-10-CM | POA: Diagnosis not present

## 2018-03-30 DIAGNOSIS — Z9002 Acquired absence of larynx: Secondary | ICD-10-CM | POA: Diagnosis not present

## 2018-03-30 DIAGNOSIS — K222 Esophageal obstruction: Secondary | ICD-10-CM | POA: Diagnosis not present

## 2018-03-30 DIAGNOSIS — R491 Aphonia: Secondary | ICD-10-CM | POA: Diagnosis not present

## 2018-03-30 DIAGNOSIS — K224 Dyskinesia of esophagus: Secondary | ICD-10-CM | POA: Diagnosis not present

## 2018-03-30 DIAGNOSIS — Z923 Personal history of irradiation: Secondary | ICD-10-CM | POA: Diagnosis not present

## 2018-03-30 DIAGNOSIS — C329 Malignant neoplasm of larynx, unspecified: Secondary | ICD-10-CM | POA: Diagnosis not present

## 2018-03-30 DIAGNOSIS — R1313 Dysphagia, pharyngeal phase: Secondary | ICD-10-CM | POA: Diagnosis not present

## 2018-04-07 DIAGNOSIS — Z23 Encounter for immunization: Secondary | ICD-10-CM | POA: Diagnosis not present

## 2018-04-15 DIAGNOSIS — Z7982 Long term (current) use of aspirin: Secondary | ICD-10-CM | POA: Diagnosis not present

## 2018-04-15 DIAGNOSIS — K222 Esophageal obstruction: Secondary | ICD-10-CM | POA: Diagnosis not present

## 2018-04-15 DIAGNOSIS — K219 Gastro-esophageal reflux disease without esophagitis: Secondary | ICD-10-CM | POA: Diagnosis not present

## 2018-04-15 DIAGNOSIS — Z923 Personal history of irradiation: Secondary | ICD-10-CM | POA: Diagnosis not present

## 2018-04-15 DIAGNOSIS — C329 Malignant neoplasm of larynx, unspecified: Secondary | ICD-10-CM | POA: Diagnosis not present

## 2018-04-15 DIAGNOSIS — I10 Essential (primary) hypertension: Secondary | ICD-10-CM | POA: Diagnosis not present

## 2018-04-15 DIAGNOSIS — R1313 Dysphagia, pharyngeal phase: Secondary | ICD-10-CM | POA: Diagnosis not present

## 2018-04-15 DIAGNOSIS — Z9002 Acquired absence of larynx: Secondary | ICD-10-CM | POA: Diagnosis not present

## 2018-04-15 DIAGNOSIS — Z8521 Personal history of malignant neoplasm of larynx: Secondary | ICD-10-CM | POA: Diagnosis not present

## 2018-04-29 DIAGNOSIS — H353131 Nonexudative age-related macular degeneration, bilateral, early dry stage: Secondary | ICD-10-CM | POA: Diagnosis not present

## 2018-05-20 DIAGNOSIS — R1313 Dysphagia, pharyngeal phase: Secondary | ICD-10-CM | POA: Diagnosis not present

## 2018-05-20 DIAGNOSIS — Z923 Personal history of irradiation: Secondary | ICD-10-CM | POA: Diagnosis not present

## 2018-05-20 DIAGNOSIS — Z9002 Acquired absence of larynx: Secondary | ICD-10-CM | POA: Diagnosis not present

## 2018-05-20 DIAGNOSIS — I10 Essential (primary) hypertension: Secondary | ICD-10-CM | POA: Diagnosis not present

## 2018-05-20 DIAGNOSIS — K222 Esophageal obstruction: Secondary | ICD-10-CM | POA: Diagnosis not present

## 2018-05-20 DIAGNOSIS — C329 Malignant neoplasm of larynx, unspecified: Secondary | ICD-10-CM | POA: Diagnosis not present

## 2018-05-20 DIAGNOSIS — R491 Aphonia: Secondary | ICD-10-CM | POA: Diagnosis not present

## 2018-05-20 DIAGNOSIS — Z8521 Personal history of malignant neoplasm of larynx: Secondary | ICD-10-CM | POA: Diagnosis not present

## 2018-06-01 DIAGNOSIS — Z448 Encounter for fitting and adjustment of other external prosthetic devices: Secondary | ICD-10-CM | POA: Diagnosis not present

## 2018-06-01 DIAGNOSIS — Z9002 Acquired absence of larynx: Secondary | ICD-10-CM | POA: Diagnosis not present

## 2018-06-30 DIAGNOSIS — R1310 Dysphagia, unspecified: Secondary | ICD-10-CM | POA: Diagnosis not present

## 2018-06-30 DIAGNOSIS — I10 Essential (primary) hypertension: Secondary | ICD-10-CM | POA: Diagnosis not present

## 2018-06-30 DIAGNOSIS — T85638A Leakage of other specified internal prosthetic devices, implants and grafts, initial encounter: Secondary | ICD-10-CM | POA: Diagnosis not present

## 2018-06-30 DIAGNOSIS — Z79899 Other long term (current) drug therapy: Secondary | ICD-10-CM | POA: Diagnosis not present

## 2018-06-30 DIAGNOSIS — L231 Allergic contact dermatitis due to adhesives: Secondary | ICD-10-CM | POA: Diagnosis not present

## 2018-06-30 DIAGNOSIS — Z9002 Acquired absence of larynx: Secondary | ICD-10-CM | POA: Diagnosis not present

## 2018-07-16 DIAGNOSIS — M7989 Other specified soft tissue disorders: Secondary | ICD-10-CM | POA: Diagnosis not present

## 2018-07-16 DIAGNOSIS — M799 Soft tissue disorder, unspecified: Secondary | ICD-10-CM | POA: Diagnosis not present

## 2018-07-16 DIAGNOSIS — Z8521 Personal history of malignant neoplasm of larynx: Secondary | ICD-10-CM | POA: Diagnosis not present

## 2018-07-16 DIAGNOSIS — I6523 Occlusion and stenosis of bilateral carotid arteries: Secondary | ICD-10-CM | POA: Diagnosis not present

## 2020-02-19 DEATH — deceased
